# Patient Record
Sex: Male | Born: 1935
Health system: Southern US, Community
[De-identification: ages and names within clinical notes are randomized; demographics above are authoritative.]

## PROBLEM LIST (undated history)

## (undated) ENCOUNTER — Emergency Department (HOSPITAL_BASED_OUTPATIENT_CLINIC_OR_DEPARTMENT_OTHER): Admission: EM | Payer: Medicare Other | Source: Home / Self Care

## (undated) DIAGNOSIS — I509 Heart failure, unspecified: Secondary | ICD-10-CM

## (undated) DIAGNOSIS — E119 Type 2 diabetes mellitus without complications: Secondary | ICD-10-CM

## (undated) DIAGNOSIS — I10 Essential (primary) hypertension: Secondary | ICD-10-CM

---

## 1997-08-16 ENCOUNTER — Ambulatory Visit (HOSPITAL_COMMUNITY): Admission: RE | Admit: 1997-08-16 | Discharge: 1997-08-16 | Payer: Self-pay | Admitting: Internal Medicine

## 1997-09-12 ENCOUNTER — Ambulatory Visit (HOSPITAL_COMMUNITY): Admission: RE | Admit: 1997-09-12 | Discharge: 1997-09-12 | Payer: Self-pay | Admitting: *Deleted

## 1997-09-28 ENCOUNTER — Ambulatory Visit (HOSPITAL_COMMUNITY): Admission: RE | Admit: 1997-09-28 | Discharge: 1997-09-28 | Payer: Self-pay | Admitting: Cardiology

## 1997-10-02 ENCOUNTER — Ambulatory Visit (HOSPITAL_BASED_OUTPATIENT_CLINIC_OR_DEPARTMENT_OTHER): Admission: RE | Admit: 1997-10-02 | Discharge: 1997-10-02 | Payer: Self-pay | Admitting: Orthopedic Surgery

## 1999-09-01 ENCOUNTER — Encounter: Payer: Self-pay | Admitting: Endocrinology

## 1999-09-01 ENCOUNTER — Ambulatory Visit (HOSPITAL_COMMUNITY): Admission: RE | Admit: 1999-09-01 | Discharge: 1999-09-01 | Payer: Self-pay | Admitting: Endocrinology

## 2000-09-08 ENCOUNTER — Ambulatory Visit (HOSPITAL_COMMUNITY): Admission: RE | Admit: 2000-09-08 | Discharge: 2000-09-08 | Payer: Self-pay | Admitting: Internal Medicine

## 2000-09-08 ENCOUNTER — Encounter: Payer: Self-pay | Admitting: Internal Medicine

## 2001-05-17 ENCOUNTER — Encounter: Payer: Self-pay | Admitting: Rheumatology

## 2001-05-17 ENCOUNTER — Encounter: Admission: RE | Admit: 2001-05-17 | Discharge: 2001-05-17 | Payer: Self-pay | Admitting: Rheumatology

## 2002-10-15 ENCOUNTER — Ambulatory Visit (HOSPITAL_BASED_OUTPATIENT_CLINIC_OR_DEPARTMENT_OTHER): Admission: RE | Admit: 2002-10-15 | Discharge: 2002-10-15 | Payer: Self-pay | Admitting: Cardiology

## 2003-07-02 ENCOUNTER — Encounter: Admission: RE | Admit: 2003-07-02 | Discharge: 2003-07-02 | Payer: Self-pay | Admitting: Rheumatology

## 2003-07-13 ENCOUNTER — Ambulatory Visit (HOSPITAL_COMMUNITY): Admission: RE | Admit: 2003-07-13 | Discharge: 2003-07-13 | Payer: Self-pay | Admitting: Internal Medicine

## 2003-08-07 ENCOUNTER — Encounter: Admission: RE | Admit: 2003-08-07 | Discharge: 2003-08-07 | Payer: Self-pay | Admitting: Rheumatology

## 2003-12-28 ENCOUNTER — Ambulatory Visit: Payer: Self-pay | Admitting: Cardiology

## 2003-12-31 ENCOUNTER — Ambulatory Visit: Payer: Self-pay | Admitting: Internal Medicine

## 2004-01-07 ENCOUNTER — Ambulatory Visit: Payer: Self-pay | Admitting: Cardiology

## 2004-02-04 ENCOUNTER — Ambulatory Visit: Payer: Self-pay | Admitting: Internal Medicine

## 2004-02-13 ENCOUNTER — Encounter: Admission: RE | Admit: 2004-02-13 | Discharge: 2004-02-13 | Payer: Self-pay | Admitting: Neurosurgery

## 2005-01-19 ENCOUNTER — Ambulatory Visit: Payer: Self-pay | Admitting: Cardiology

## 2005-06-09 ENCOUNTER — Ambulatory Visit: Payer: Self-pay | Admitting: Internal Medicine

## 2005-07-10 ENCOUNTER — Ambulatory Visit: Payer: Self-pay | Admitting: Internal Medicine

## 2005-07-10 LAB — CONVERTED CEMR LAB: PSA: 0.69 ng/mL

## 2005-07-16 ENCOUNTER — Ambulatory Visit: Payer: Self-pay | Admitting: Internal Medicine

## 2005-08-14 ENCOUNTER — Ambulatory Visit: Payer: Self-pay | Admitting: Internal Medicine

## 2005-11-25 ENCOUNTER — Ambulatory Visit: Payer: Self-pay | Admitting: Internal Medicine

## 2006-09-09 ENCOUNTER — Ambulatory Visit: Payer: Self-pay | Admitting: Cardiology

## 2006-09-27 ENCOUNTER — Ambulatory Visit: Payer: Self-pay | Admitting: Internal Medicine

## 2006-09-27 LAB — CONVERTED CEMR LAB
ALT: 21 units/L (ref 0–53)
AST: 20 units/L (ref 0–37)
Albumin: 3.6 g/dL (ref 3.5–5.2)
Alkaline Phosphatase: 84 units/L (ref 39–117)
BUN: 18 mg/dL (ref 6–23)
Basophils Absolute: 0.1 10*3/uL (ref 0.0–0.1)
Basophils Relative: 2.2 % — ABNORMAL HIGH (ref 0.0–1.0)
Bilirubin Urine: NEGATIVE
Bilirubin, Direct: 0.1 mg/dL (ref 0.0–0.3)
CO2: 27 meq/L (ref 19–32)
Calcium: 9.1 mg/dL (ref 8.4–10.5)
Chloride: 109 meq/L (ref 96–112)
Cholesterol: 198 mg/dL (ref 0–200)
Creatinine, Ser: 1.3 mg/dL (ref 0.4–1.5)
Direct LDL: 122.8 mg/dL
Eosinophils Absolute: 0.5 10*3/uL (ref 0.0–0.6)
Eosinophils Relative: 6.9 % — ABNORMAL HIGH (ref 0.0–5.0)
GFR calc Af Amer: 70 mL/min
GFR calc non Af Amer: 58 mL/min
Glucose, Bld: 149 mg/dL — ABNORMAL HIGH (ref 70–99)
HCT: 39.2 % (ref 39.0–52.0)
HDL: 34.5 mg/dL — ABNORMAL LOW (ref >39.0)
Hemoglobin, Urine: NEGATIVE
Hemoglobin: 13.8 g/dL (ref 13.0–17.0)
Hgb A1c MFr Bld: 6.3 % — ABNORMAL HIGH (ref 4.6–6.0)
Ketones, ur: NEGATIVE mg/dL
Leukocytes, UA: NEGATIVE
Lymphocytes Relative: 18.4 % (ref 12.0–46.0)
MCHC: 35.1 g/dL (ref 30.0–36.0)
MCV: 95.3 fL (ref 78.0–100.0)
Monocytes Absolute: 0.7 10*3/uL (ref 0.2–0.7)
Monocytes Relative: 9.7 % (ref 3.0–11.0)
Neutro Abs: 4.2 10*3/uL (ref 1.4–7.7)
Neutrophils Relative %: 62.8 % (ref 43.0–77.0)
Nitrite: NEGATIVE
PSA: 0.66 ng/mL (ref 0.10–4.00)
Platelets: 291 10*3/uL (ref 150–400)
Potassium: 4.1 meq/L (ref 3.5–5.1)
RBC: 4.12 M/uL — ABNORMAL LOW (ref 4.22–5.81)
RDW: 14.9 % — ABNORMAL HIGH (ref 11.5–14.6)
Sodium: 143 meq/L (ref 135–145)
Specific Gravity, Urine: >=1.03 (ref 1.000–1.03)
TSH: 1.22 microintl units/mL (ref 0.35–5.50)
Total Bilirubin: 0.7 mg/dL (ref 0.3–1.2)
Total CHOL/HDL Ratio: 5.7
Total Protein, Urine: NEGATIVE mg/dL
Total Protein: 6.5 g/dL (ref 6.0–8.3)
Triglycerides: 231 mg/dL (ref 0–149)
Urine Glucose: NEGATIVE mg/dL
Urobilinogen, UA: 0.2 (ref 0.0–1.0)
VLDL: 46 mg/dL — ABNORMAL HIGH (ref 0–40)
WBC: 6.8 10*3/uL (ref 4.5–10.5)
pH: 5.5 (ref 5.0–8.0)

## 2006-09-28 ENCOUNTER — Encounter: Payer: Self-pay | Admitting: Internal Medicine

## 2006-09-28 DIAGNOSIS — Z8679 Personal history of other diseases of the circulatory system: Secondary | ICD-10-CM | POA: Insufficient documentation

## 2006-09-28 DIAGNOSIS — M545 Low back pain, unspecified: Secondary | ICD-10-CM | POA: Insufficient documentation

## 2006-09-28 DIAGNOSIS — R32 Unspecified urinary incontinence: Secondary | ICD-10-CM | POA: Insufficient documentation

## 2006-09-28 DIAGNOSIS — Z860100 Personal history of colon polyps, unspecified: Secondary | ICD-10-CM | POA: Insufficient documentation

## 2006-09-28 DIAGNOSIS — E119 Type 2 diabetes mellitus without complications: Secondary | ICD-10-CM | POA: Insufficient documentation

## 2006-09-28 DIAGNOSIS — F329 Major depressive disorder, single episode, unspecified: Secondary | ICD-10-CM

## 2006-09-28 DIAGNOSIS — M069 Rheumatoid arthritis, unspecified: Secondary | ICD-10-CM | POA: Insufficient documentation

## 2006-09-28 DIAGNOSIS — J309 Allergic rhinitis, unspecified: Secondary | ICD-10-CM | POA: Insufficient documentation

## 2006-09-28 DIAGNOSIS — K279 Peptic ulcer, site unspecified, unspecified as acute or chronic, without hemorrhage or perforation: Secondary | ICD-10-CM | POA: Insufficient documentation

## 2006-09-28 DIAGNOSIS — K648 Other hemorrhoids: Secondary | ICD-10-CM | POA: Insufficient documentation

## 2006-09-28 DIAGNOSIS — Z87442 Personal history of urinary calculi: Secondary | ICD-10-CM | POA: Insufficient documentation

## 2006-09-28 DIAGNOSIS — F3289 Other specified depressive episodes: Secondary | ICD-10-CM | POA: Insufficient documentation

## 2006-09-28 DIAGNOSIS — G4733 Obstructive sleep apnea (adult) (pediatric): Secondary | ICD-10-CM | POA: Insufficient documentation

## 2006-09-28 DIAGNOSIS — I509 Heart failure, unspecified: Secondary | ICD-10-CM | POA: Insufficient documentation

## 2006-10-26 ENCOUNTER — Ambulatory Visit: Payer: Self-pay | Admitting: Gastroenterology

## 2006-11-09 ENCOUNTER — Ambulatory Visit: Payer: Self-pay | Admitting: Gastroenterology

## 2007-02-28 ENCOUNTER — Ambulatory Visit: Payer: Self-pay | Admitting: Internal Medicine

## 2007-02-28 DIAGNOSIS — K219 Gastro-esophageal reflux disease without esophagitis: Secondary | ICD-10-CM | POA: Insufficient documentation

## 2007-02-28 DIAGNOSIS — L989 Disorder of the skin and subcutaneous tissue, unspecified: Secondary | ICD-10-CM | POA: Insufficient documentation

## 2007-02-28 DIAGNOSIS — E785 Hyperlipidemia, unspecified: Secondary | ICD-10-CM | POA: Insufficient documentation

## 2007-02-28 DIAGNOSIS — I10 Essential (primary) hypertension: Secondary | ICD-10-CM | POA: Insufficient documentation

## 2007-03-01 ENCOUNTER — Encounter (INDEPENDENT_AMBULATORY_CARE_PROVIDER_SITE_OTHER): Payer: Self-pay | Admitting: *Deleted

## 2007-03-03 ENCOUNTER — Encounter: Payer: Self-pay | Admitting: Internal Medicine

## 2007-03-03 LAB — CONVERTED CEMR LAB
BUN: 18 mg/dL (ref 6–23)
CO2: 26 meq/L (ref 19–32)
Calcium: 9.8 mg/dL (ref 8.4–10.5)
Chloride: 105 meq/L (ref 96–112)
Cholesterol: 190 mg/dL (ref 0–200)
Creatinine, Ser: 1.5 mg/dL (ref 0.4–1.5)
Direct LDL: 125.4 mg/dL
GFR calc Af Amer: 59 mL/min
GFR calc non Af Amer: 49 mL/min
Glucose, Bld: 138 mg/dL — ABNORMAL HIGH (ref 70–99)
HDL: 30.4 mg/dL — ABNORMAL LOW (ref >39.0)
Hgb A1c MFr Bld: 7 % — ABNORMAL HIGH (ref 4.6–6.0)
Potassium: 4.3 meq/L (ref 3.5–5.1)
Sodium: 139 meq/L (ref 135–145)
Total CHOL/HDL Ratio: 6.3
Triglycerides: 229 mg/dL (ref 0–149)
VLDL: 46 mg/dL — ABNORMAL HIGH (ref 0–40)

## 2007-03-07 ENCOUNTER — Telehealth: Payer: Self-pay | Admitting: Internal Medicine

## 2007-04-02 ENCOUNTER — Encounter: Payer: Self-pay | Admitting: Internal Medicine

## 2007-06-01 ENCOUNTER — Encounter: Payer: Self-pay | Admitting: Internal Medicine

## 2007-06-29 ENCOUNTER — Ambulatory Visit: Payer: Self-pay | Admitting: Internal Medicine

## 2007-06-29 LAB — CONVERTED CEMR LAB: Blood Glucose, Fingerstick: 129

## 2007-09-07 ENCOUNTER — Ambulatory Visit: Payer: Self-pay | Admitting: Internal Medicine

## 2007-09-07 ENCOUNTER — Ambulatory Visit: Payer: Self-pay | Admitting: Cardiology

## 2007-09-07 DIAGNOSIS — R1032 Left lower quadrant pain: Secondary | ICD-10-CM | POA: Insufficient documentation

## 2007-09-07 LAB — CONVERTED CEMR LAB
ALT: 24 units/L (ref 0–53)
AST: 24 units/L (ref 0–37)
Albumin: 3.6 g/dL (ref 3.5–5.2)
Alkaline Phosphatase: 69 units/L (ref 39–117)
BUN: 13 mg/dL (ref 6–23)
Bacteria, UA: NEGATIVE
Basophils Absolute: 0 10*3/uL (ref 0.0–0.1)
Basophils Relative: 0 % (ref 0.0–1.0)
Bilirubin Urine: NEGATIVE
Bilirubin, Direct: 0.2 mg/dL (ref 0.0–0.3)
CO2: 26 meq/L (ref 19–32)
Calcium: 9 mg/dL (ref 8.4–10.5)
Chloride: 109 meq/L (ref 96–112)
Creatinine, Ser: 1.3 mg/dL (ref 0.4–1.5)
Crystals: NEGATIVE
Eosinophils Absolute: 0.3 10*3/uL (ref 0.0–0.7)
Eosinophils Relative: 4.2 % (ref 0.0–5.0)
GFR calc Af Amer: 70 mL/min
GFR calc non Af Amer: 58 mL/min
Glucose, Bld: 120 mg/dL — ABNORMAL HIGH (ref 70–99)
HCT: 40.1 % (ref 39.0–52.0)
Hemoglobin, Urine: NEGATIVE
Hemoglobin: 13.8 g/dL (ref 13.0–17.0)
Hgb A1c MFr Bld: 6.3 % — ABNORMAL HIGH (ref 4.6–6.0)
Ketones, ur: NEGATIVE mg/dL
Leukocytes, UA: NEGATIVE
Lipase: 25 units/L (ref 11.0–59.0)
Lymphocytes Relative: 14.9 % (ref 12.0–46.0)
MCHC: 34.4 g/dL (ref 30.0–36.0)
MCV: 98.4 fL (ref 78.0–100.0)
Monocytes Absolute: 0.4 10*3/uL (ref 0.1–1.0)
Monocytes Relative: 6.2 % (ref 3.0–12.0)
Neutro Abs: 5.3 10*3/uL (ref 1.4–7.7)
Neutrophils Relative %: 74.7 % (ref 43.0–77.0)
Nitrite: NEGATIVE
Platelets: 294 10*3/uL (ref 150–400)
Potassium: 4.5 meq/L (ref 3.5–5.1)
RBC: 4.08 M/uL — ABNORMAL LOW (ref 4.22–5.81)
RDW: 13.4 % (ref 11.5–14.6)
Sodium: 140 meq/L (ref 135–145)
Specific Gravity, Urine: >=1.03 (ref 1.000–1.03)
Total Bilirubin: 0.8 mg/dL (ref 0.3–1.2)
Total Protein, Urine: NEGATIVE mg/dL
Total Protein: 6.5 g/dL (ref 6.0–8.3)
Urine Glucose: NEGATIVE mg/dL
Urobilinogen, UA: 0.2 (ref 0.0–1.0)
WBC: 7.1 10*3/uL (ref 4.5–10.5)
pH: 5.5 (ref 5.0–8.0)

## 2007-09-08 ENCOUNTER — Encounter: Payer: Self-pay | Admitting: Internal Medicine

## 2007-09-26 ENCOUNTER — Ambulatory Visit: Payer: Self-pay | Admitting: Cardiology

## 2007-11-02 ENCOUNTER — Encounter: Payer: Self-pay | Admitting: Internal Medicine

## 2008-01-02 ENCOUNTER — Encounter: Payer: Self-pay | Admitting: Internal Medicine

## 2008-01-13 ENCOUNTER — Encounter: Payer: Self-pay | Admitting: Internal Medicine

## 2008-04-03 ENCOUNTER — Encounter: Payer: Self-pay | Admitting: Internal Medicine

## 2008-04-30 ENCOUNTER — Ambulatory Visit: Payer: Self-pay | Admitting: Internal Medicine

## 2008-04-30 DIAGNOSIS — IMO0002 Reserved for concepts with insufficient information to code with codable children: Secondary | ICD-10-CM | POA: Insufficient documentation

## 2008-04-30 LAB — CONVERTED CEMR LAB
ALT: 33 units/L (ref 0–53)
AST: 28 units/L (ref 0–37)
Albumin: 4 g/dL (ref 3.5–5.2)
Alkaline Phosphatase: 76 units/L (ref 39–117)
BUN: 17 mg/dL (ref 6–23)
Basophils Absolute: 0 10*3/uL (ref 0.0–0.1)
Basophils Relative: 0.1 % (ref 0.0–3.0)
Bilirubin Urine: NEGATIVE
Bilirubin, Direct: 0.1 mg/dL (ref 0.0–0.3)
CO2: 27 meq/L (ref 19–32)
Calcium: 9 mg/dL (ref 8.4–10.5)
Chloride: 109 meq/L (ref 96–112)
Cholesterol: 145 mg/dL (ref 0–200)
Creatinine, Ser: 1.4 mg/dL (ref 0.4–1.5)
Creatinine,U: 427.9 mg/dL
Eosinophils Absolute: 0.4 10*3/uL (ref 0.0–0.7)
Eosinophils Relative: 7.1 % — ABNORMAL HIGH (ref 0.0–5.0)
GFR calc Af Amer: 64 mL/min
GFR calc non Af Amer: 53 mL/min
Glucose, Bld: 130 mg/dL — ABNORMAL HIGH (ref 70–99)
HCT: 42.3 % (ref 39.0–52.0)
HDL: 37.3 mg/dL — ABNORMAL LOW (ref >39.0)
Hemoglobin, Urine: NEGATIVE
Hemoglobin: 14.6 g/dL (ref 13.0–17.0)
Hgb A1c MFr Bld: 7.3 % — ABNORMAL HIGH (ref 4.6–6.0)
Ketones, ur: NEGATIVE mg/dL
LDL Cholesterol: 76 mg/dL (ref 0–99)
Leukocytes, UA: NEGATIVE
Lymphocytes Relative: 19.7 % (ref 12.0–46.0)
MCHC: 34.6 g/dL (ref 30.0–36.0)
MCV: 98.5 fL (ref 78.0–100.0)
Microalb Creat Ratio: 3 mg/g (ref 0.0–30.0)
Microalb, Ur: 1.3 mg/dL (ref 0.0–1.9)
Monocytes Absolute: 0.5 10*3/uL (ref 0.1–1.0)
Monocytes Relative: 9.6 % (ref 3.0–12.0)
Neutro Abs: 3.7 10*3/uL (ref 1.4–7.7)
Neutrophils Relative %: 63.5 % (ref 43.0–77.0)
Nitrite: NEGATIVE
PSA: 0.6 ng/mL (ref 0.10–4.00)
Platelets: 198 10*3/uL (ref 150–400)
Potassium: 4.2 meq/L (ref 3.5–5.1)
RBC: 4.29 M/uL (ref 4.22–5.81)
RDW: 12.7 % (ref 11.5–14.6)
Sodium: 143 meq/L (ref 135–145)
Specific Gravity, Urine: >=1.03 (ref 1.000–1.035)
TSH: 2.24 microintl units/mL (ref 0.35–5.50)
Total Bilirubin: 0.9 mg/dL (ref 0.3–1.2)
Total CHOL/HDL Ratio: 3.9
Total Protein: 6.7 g/dL (ref 6.0–8.3)
Triglycerides: 161 mg/dL — ABNORMAL HIGH (ref 0–149)
Urine Glucose: NEGATIVE mg/dL
Urobilinogen, UA: 0.2 (ref 0.0–1.0)
VLDL: 32 mg/dL (ref 0–40)
WBC: 5.7 10*3/uL (ref 4.5–10.5)
pH: 5 (ref 5.0–8.0)

## 2008-05-01 LAB — CONVERTED CEMR LAB: Vit D, 25-Hydroxy: 32 ng/mL (ref 30–89)

## 2008-05-18 ENCOUNTER — Telehealth (INDEPENDENT_AMBULATORY_CARE_PROVIDER_SITE_OTHER): Payer: Self-pay | Admitting: *Deleted

## 2008-05-28 ENCOUNTER — Encounter: Admission: RE | Admit: 2008-05-28 | Discharge: 2008-05-28 | Payer: Self-pay | Admitting: Internal Medicine

## 2008-06-26 ENCOUNTER — Telehealth: Payer: Self-pay | Admitting: Cardiology

## 2008-07-16 ENCOUNTER — Ambulatory Visit: Payer: Self-pay | Admitting: Internal Medicine

## 2008-07-16 DIAGNOSIS — B029 Zoster without complications: Secondary | ICD-10-CM | POA: Insufficient documentation

## 2008-07-21 ENCOUNTER — Telehealth: Payer: Self-pay | Admitting: Internal Medicine

## 2008-07-24 ENCOUNTER — Telehealth (INDEPENDENT_AMBULATORY_CARE_PROVIDER_SITE_OTHER): Payer: Self-pay | Admitting: *Deleted

## 2008-08-06 ENCOUNTER — Telehealth: Payer: Self-pay | Admitting: Internal Medicine

## 2008-08-13 ENCOUNTER — Telehealth: Payer: Self-pay | Admitting: Internal Medicine

## 2008-08-14 ENCOUNTER — Encounter: Payer: Self-pay | Admitting: Internal Medicine

## 2008-08-28 ENCOUNTER — Ambulatory Visit: Payer: Self-pay | Admitting: Cardiovascular Disease

## 2008-08-28 ENCOUNTER — Telehealth: Payer: Self-pay | Admitting: Cardiology

## 2008-08-28 ENCOUNTER — Observation Stay (HOSPITAL_COMMUNITY): Admission: EM | Admit: 2008-08-28 | Discharge: 2008-08-29 | Payer: Self-pay | Admitting: Emergency Medicine

## 2008-08-29 ENCOUNTER — Telehealth (INDEPENDENT_AMBULATORY_CARE_PROVIDER_SITE_OTHER): Payer: Self-pay | Admitting: *Deleted

## 2008-08-29 ENCOUNTER — Telehealth: Payer: Self-pay | Admitting: Cardiology

## 2008-09-03 ENCOUNTER — Telehealth (INDEPENDENT_AMBULATORY_CARE_PROVIDER_SITE_OTHER): Payer: Self-pay | Admitting: *Deleted

## 2008-09-13 ENCOUNTER — Encounter (INDEPENDENT_AMBULATORY_CARE_PROVIDER_SITE_OTHER): Payer: Self-pay | Admitting: *Deleted

## 2008-09-24 ENCOUNTER — Telehealth (INDEPENDENT_AMBULATORY_CARE_PROVIDER_SITE_OTHER): Payer: Self-pay | Admitting: *Deleted

## 2008-11-20 ENCOUNTER — Ambulatory Visit: Payer: Self-pay | Admitting: Cardiology

## 2008-12-11 ENCOUNTER — Inpatient Hospital Stay (HOSPITAL_COMMUNITY): Admission: EM | Admit: 2008-12-11 | Discharge: 2008-12-14 | Payer: Self-pay | Admitting: Emergency Medicine

## 2008-12-11 ENCOUNTER — Ambulatory Visit: Payer: Self-pay | Admitting: Cardiovascular Disease

## 2008-12-11 ENCOUNTER — Ambulatory Visit: Payer: Self-pay | Admitting: Surgery

## 2008-12-12 ENCOUNTER — Encounter (INDEPENDENT_AMBULATORY_CARE_PROVIDER_SITE_OTHER): Payer: Self-pay | Admitting: Internal Medicine

## 2009-06-13 ENCOUNTER — Emergency Department (HOSPITAL_COMMUNITY): Admission: EM | Admit: 2009-06-13 | Discharge: 2009-06-13 | Payer: Self-pay | Admitting: Emergency Medicine

## 2009-11-18 ENCOUNTER — Emergency Department (HOSPITAL_COMMUNITY): Admission: EM | Admit: 2009-11-18 | Discharge: 2009-11-18 | Payer: Self-pay | Admitting: Emergency Medicine

## 2010-05-06 NOTE — Letter (Signed)
Summary: Dismissal Activation Form, Return Receipt  Dismissal Activation Form, Return Receipt   Imported By: Maryln Gottron 05/02/2010 10:03:08  _____________________________________________________________________  External Attachment:    Type:   Image     Comment:   External Document

## 2010-05-08 LAB — POCT I-STAT, CHEM 8
BUN: 21 mg/dL (ref 6–23)
Calcium, Ion: 1.22 mmol/L (ref 1.12–1.32)
Chloride: 107 mEq/L (ref 96–112)
Creatinine, Ser: 1.3 mg/dL (ref 0.4–1.5)
Glucose, Bld: 58 mg/dL — ABNORMAL LOW (ref 70–99)
HCT: 40 % (ref 39.0–52.0)
Hemoglobin: 13.6 g/dL (ref 13.0–17.0)
Potassium: 4 mEq/L (ref 3.5–5.1)
Sodium: 138 mEq/L (ref 135–145)
TCO2: 23 mmol/L (ref 0–100)

## 2010-05-08 LAB — D-DIMER, QUANTITATIVE: D-Dimer, Quant: 1.11 ug/mL-FEU — ABNORMAL HIGH (ref 0.00–0.48)

## 2010-05-08 LAB — POCT CARDIAC MARKERS
CKMB, poc: 2.4 ng/mL (ref 1.0–8.0)
Troponin i, poc: <0.05 ng/mL (ref 0.00–0.09)

## 2010-05-29 LAB — UIFE/LIGHT CHAINS/TP QN, 24-HR UR
Free Lambda Lt Chains,Ur: 0.54 mg/dL (ref 0.08–1.01)
Total Protein, Urine: 4.4 mg/dL

## 2010-05-29 LAB — DIFFERENTIAL
Basophils Absolute: 0 10*3/uL (ref 0.0–0.1)
Basophils Relative: 0 % (ref 0–1)
Eosinophils Absolute: 0 10*3/uL (ref 0.0–0.7)
Eosinophils Absolute: 0.1 10*3/uL (ref 0.0–0.7)
Eosinophils Relative: 0 % (ref 0–5)
Lymphocytes Relative: 22 % (ref 12–46)
Lymphs Abs: 0.7 10*3/uL (ref 0.7–4.0)
Monocytes Absolute: 0.4 10*3/uL (ref 0.1–1.0)
Monocytes Relative: 7 % (ref 3–12)
Neutro Abs: 2.2 10*3/uL (ref 1.7–7.7)
Neutrophils Relative %: 69 % (ref 43–77)

## 2010-05-29 LAB — CBC
HCT: 29.1 % — ABNORMAL LOW (ref 39.0–52.0)
Hemoglobin: 8.5 g/dL — ABNORMAL LOW (ref 13.0–17.0)
Hemoglobin: 9.9 g/dL — ABNORMAL LOW (ref 13.0–17.0)
MCHC: 34.2 g/dL (ref 30.0–36.0)
MCHC: 35 g/dL (ref 30.0–36.0)
MCV: 103.8 fL — ABNORMAL HIGH (ref 78.0–100.0)
MCV: 104.8 fL — ABNORMAL HIGH (ref 78.0–100.0)
RDW: 13.5 % (ref 11.5–15.5)
RDW: 14.4 % (ref 11.5–15.5)
RDW: 14.6 % (ref 11.5–15.5)
WBC: 3.6 10*3/uL — ABNORMAL LOW (ref 4.0–10.5)

## 2010-05-29 LAB — CULTURE, BLOOD (ROUTINE X 2)

## 2010-05-29 LAB — URINALYSIS, ROUTINE W REFLEX MICROSCOPIC
Bilirubin Urine: NEGATIVE
Nitrite: NEGATIVE
Specific Gravity, Urine: 1.011 (ref 1.005–1.030)
Urobilinogen, UA: 0.2 mg/dL (ref 0.0–1.0)

## 2010-05-29 LAB — HEMOCCULT GUIAC POC 1CARD (OFFICE): Fecal Occult Bld: NEGATIVE

## 2010-05-29 LAB — COMPREHENSIVE METABOLIC PANEL
ALT: 49 U/L (ref 0–53)
AST: 35 U/L (ref 0–37)
Albumin: 2.8 g/dL — ABNORMAL LOW (ref 3.5–5.2)
Alkaline Phosphatase: 69 U/L (ref 39–117)
Calcium: 7.6 mg/dL — ABNORMAL LOW (ref 8.4–10.5)
Chloride: 105 mEq/L (ref 96–112)
GFR calc Af Amer: 46 mL/min — ABNORMAL LOW (ref >60)
Glucose, Bld: 98 mg/dL (ref 70–99)
Potassium: 4.1 mEq/L (ref 3.5–5.1)
Sodium: 135 mEq/L (ref 135–145)
Total Bilirubin: 0.6 mg/dL (ref 0.3–1.2)
Total Protein: 5 g/dL — ABNORMAL LOW (ref 6.0–8.3)

## 2010-05-29 LAB — GLUCOSE, CAPILLARY
Glucose-Capillary: 100 mg/dL — ABNORMAL HIGH (ref 70–99)
Glucose-Capillary: 113 mg/dL — ABNORMAL HIGH (ref 70–99)
Glucose-Capillary: 187 mg/dL — ABNORMAL HIGH (ref 70–99)
Glucose-Capillary: 77 mg/dL (ref 70–99)
Glucose-Capillary: 96 mg/dL (ref 70–99)

## 2010-05-29 LAB — PROTEIN ELECTROPHORESIS, SERUM
Gamma Globulin: 17.2 % (ref 11.1–18.8)
Total Protein ELP: 6 g/dL (ref 6.0–8.3)

## 2010-05-29 LAB — CORTISOL: Cortisol, Plasma: 8.6 ug/dL

## 2010-05-29 LAB — IRON AND TIBC
Iron: 88 ug/dL (ref 42–135)
TIBC: 251 ug/dL (ref 215–435)

## 2010-05-29 LAB — URINE CULTURE

## 2010-05-29 LAB — LACTATE DEHYDROGENASE: LDH: 186 U/L (ref 94–250)

## 2010-05-29 LAB — CK TOTAL AND CKMB (NOT AT ARMC): CK, MB: 0.6 ng/mL (ref 0.3–4.0)

## 2010-06-01 LAB — BASIC METABOLIC PANEL
BUN: 23 mg/dL (ref 6–23)
CO2: 27 mEq/L (ref 19–32)
Chloride: 104 mEq/L (ref 96–112)
GFR calc Af Amer: 55 mL/min — ABNORMAL LOW (ref >60)
GFR calc Af Amer: >60 mL/min (ref >60)
GFR calc non Af Amer: 50 mL/min — ABNORMAL LOW (ref >60)
Potassium: 5.1 mEq/L (ref 3.5–5.1)
Sodium: 134 mEq/L — ABNORMAL LOW (ref 135–145)
Sodium: 138 mEq/L (ref 135–145)

## 2010-06-01 LAB — DIFFERENTIAL
Eosinophils Relative: 3 % (ref 0–5)
Lymphocytes Relative: 10 % — ABNORMAL LOW (ref 12–46)
Lymphs Abs: 0.7 10*3/uL (ref 0.7–4.0)
Monocytes Relative: 7 % (ref 3–12)

## 2010-06-01 LAB — GLUCOSE, CAPILLARY
Glucose-Capillary: 111 mg/dL — ABNORMAL HIGH (ref 70–99)
Glucose-Capillary: 151 mg/dL — ABNORMAL HIGH (ref 70–99)

## 2010-06-01 LAB — CBC
HCT: 42.6 % (ref 39.0–52.0)
Platelets: 240 10*3/uL (ref 150–400)
RBC: 4.23 MIL/uL (ref 4.22–5.81)
WBC: 7.7 10*3/uL (ref 4.0–10.5)

## 2010-06-01 LAB — PROTIME-INR: Prothrombin Time: 13.5 seconds (ref 11.6–15.2)

## 2010-06-01 LAB — CARDIAC PANEL(CRET KIN+CKTOT+MB+TROPI)
Relative Index: INVALID (ref 0.0–2.5)
Total CK: 49 U/L (ref 7–232)

## 2010-06-01 LAB — D-DIMER, QUANTITATIVE: D-Dimer, Quant: 1.09 ug/mL-FEU — ABNORMAL HIGH (ref 0.00–0.48)

## 2010-06-01 LAB — CK TOTAL AND CKMB (NOT AT ARMC)
CK, MB: 1 ng/mL (ref 0.3–4.0)
Relative Index: INVALID (ref 0.0–2.5)

## 2010-07-08 NOTE — H&P (Signed)
NAME:  Howard Shepard, Howard Shepard NO.:  [id number]   MEDICAL RECORD NO.:  [id number]          PATIENT TYPE:  INP   LOCATION:  2008                         FACILITY:  [facility]   PHYSICIAN:  Veverly Fells. Excell Seltzer, MD  DATE OF BIRTH:  December 07, 1933   DATE OF ADMISSION:  08/28/2008  DATE OF DISCHARGE:                              HISTORY & PHYSICAL   PRIMARY CARDIOLOGIST:  [name], MD, [facility]   PRIMARY CARE PHYSICIAN:  Corwin Levins, MD   CHIEF COMPLAINT:  Shortness of breath.   HISTORY OF PRESENT ILLNESS:  Howard Shepard is a 77 year old Caucasian male  with a history significant for nonischemic cardiomyopathy, last ejection  fraction measured at 50%, well-controlled hypertension, and  hyperlipidemia followed by Dr. Jonny Ruiz presenting with acute onset of  shortness of breath in the setting of 1-1/2 months of severe pain  secondary to shingles.  The patient reports significant pain in the  right upper back and right shoulder over the last 1 to 1-1/2 months, and  reports being diagnosed with shingles at his last appointment with Dr.  Jonny Ruiz.  Since that time, the patient has been very inactive secondary to  his discomfort.  The patient also reports 1 month of productive cough  with mild amount of yellowish sputum production.  No recent change to  his cough.  Earlier today, he began to experience significant shortness  of breath.  He reports being able to take a deep breath, but still felt  like he was not getting enough air.  He denies chest pain, orthopnea,  lower extremity edema, nausea/vomiting, fever/chills, or any other  complaints.  Currently, shortness of breath slightly improved.  In the  emergency room, his BP was mildly elevated without tachycardia or  tachypnea.   PAST MEDICAL HISTORY:  1. Nonischemic cardiomyopathy, EF equal to 50%.  2. Hypertension (well controlled).  3. Hyperlipidemia (followed by Dr. Jonny Ruiz).  4. Shingles.  5. Lumbar radiculopathy, left.  6. History of  peptic ulcer disease.  7. History of nephrolithiasis.  8. GERD.  9. History of depression.  10.History of transient ischemic attack.  11.History of low back pain.  12.History of obstructive sleep apnea.  13.Type 2 diabetes mellitus.  14.History of internal hemorrhoids.  15.Allergic rhinitis.  16.Urinary incontinence.  17.History of polyps.  18.Rheumatoid arthritis.   SOCIAL HISTORY:  The patient lives in [place] with his wife.  He is  retired.  He has no smoking, EtOH, or illicit drug use history.  He  takes folic acid, vitamin D, and a multivitamin.  He has a low sugar,  mostly low-sodium diet.  He does not regularly exercise especially  recently secondary to his shingles pain.   FAMILY HISTORY:  Noncontributory secondary to the patient's advanced  age.   REVIEW OF SYSTEMS:  Please see HPI.  Otherwise, mild constipation since  beginning the pain medications for shingles.  Denies any melena or other  bleeding.  All other systems reviewed and were negative.   ALLERGIES:  PENICILLIN.   MEDICATIONS:  1. Bisoprolol 5 mg p.o. daily.  2. Lisinopril 20 mg p.o. daily.  3. Enteric-coated aspirin 81 mg p.o. daily.  4. Methotrexate 2.5 mg weekly.  5. Folic acid 400 mcg p.o. daily.  6. Glimepiride 0.5 mg p.o. daily.  7. Nexium 40 mg every other day.  8. Metformin.  9. Citalopram 5 mg p.o. daily.  10.Percocet 5/325 mg p.o. q.6 h. p.r.n.  11.Neurontin 300 mg p.o. t.i.d.  12.Crestor 10 mg p.o. at bedtime.   PHYSICAL EXAMINATION:  VITAL SIGNS:  T-max 98.3; current temperature  97.3; BP on admission 132/99, currently 118/81; pulse on admission 92,  currently 76; respiratory rate on admission 22, currently 18; and O2  saturation 96% on room air.  GENERAL:  The patient is alert and oriented x3.  He is in no apparent  distress.  He is able to speak in full sentences without respiratory  distress.  HEENT:  His head is normocephalic and atraumatic.  Pupils equal round  and reactive  to light.  Extraocular muscles are intact.  Nares are  patent without discharge.  Dentition is fair.  Oropharynx without  erythema or exudate.  NECK:  Supple without lymphadenopathy.  No JVD.  No thyromegaly.  HEART:  Heart rate regular with audible S1 and S2.  No clicks, rubs,  murmurs, or gallops.  Pulses 2+ and equal in both upper and lower  extremities bilaterally.  LUNGS:  Clear to auscultation bilaterally with decreased breath sounds  at the right base.  SKIN:  Multiple, discrete 1-2 cm in diameter lesions in the right upper  back, face surrounded with mild erythema.  Otherwise, no rashes,  lesions, or petechiae.  ABDOMEN:  Soft, nontender, and nondistended.  Normal abdominal sounds.  No rebound or guarding.  No hepatosplenomegaly.  The patient is obese.  EXTREMITIES:  No clubbing, cyanosis, or edema.  MUSCULOSKELETAL:  No joint deformity or effusions.  No spinal or CVA  tenderness.  NEUROLOGIC:  Cranial nerves II through XII are grossly intact.  Strength  was 5/5 in all extremities and axial groups.  Normal sensation  throughout and normal cerebellar function.   RADIOLOGY:  1. Chest x-ray shows probable mild-to-moderate elevation of right      hemidiaphragm.  Subpulmonic right pleural effusion not excluded,      given that this appearance is the appearance of the right      hemidiaphragm changed compared to past chest x-ray (2005).      Followup PA and lateral chest radiograph may be useful.  Lungs are      clear.  2. CT angio negative for pulmonary emboli.  Atelectasis and/or      atelectatic pneumonia in the right lower lobe posteriorly.  Chronic-      appearing interstitial lung markings.  A degree of interstitial      edema is not excluded.  3. EKG, normal sinus rhythm at rate of 79.  No acute ST or T-wave      changes, no significant Q wave, normal axis, no evidence of      hypertrophy, PR 160, QRS 94, QTc 445.  No significant changes from      prior tracing completed  on November 2008.   LABORATORY DATA:  WBC 7.7, HGB 14.7, HCT 42.6, and PLT count 240.  Sodium 134, potassium 5.1, chloride 102, CO2 24, BUN 23, creatinine  1.40, and glucose 178.  D-dimer elevated at 1.09.  Cardiac enzymes  pending.  BNP pending.   ASSESSMENT AND PLAN:  Howard Shepard is a 77 year old Caucasian male with  history described above who presents  with acute onset of shortness of  breath in the setting of severe pain secondary to herpes zoster located  in the right upper back.  Pulmonary embolism has been ruled out and  cardiac etiology of the patient's symptoms are high unlikely; however,  the patient's family is extremely anxious regarding Howard Shepard  condition.  Therefore, we will admit into observation and complete 3  sets of cardiac enzymes as well as PA and lateral chest x-ray.  Family  reports very strange relationship with the primary care physician and  wishes for a referral to a new primary care doctor.  Also, the patient  has been off his Valtrex due to family report of inability to set up an  appointment with Dr. Jonny Ruiz; therefore, the patient will be started again  on his Valtrex and continue his other home medications except for  metformin in the setting of his mildly elevated creatinine and CT angio  today.  We will follow up with BMP in case symptomatology related to  heart failure.  Likely, the patient will be discharged tomorrow.      Jarrett Ables, [facility]      Veverly Fells. Excell Seltzer, MD  Electronically Signed    MS/MEDQ  D:  08/28/2008  T:  08/29/2008  Job:  [id number]

## 2010-07-08 NOTE — Assessment & Plan Note (Signed)
[facility]                            CARDIOLOGY OFFICE NOTE   Howard Shepard, Howard Shepard                     MRN:          [id number]  DATE:09/09/2006                            DOB:          [birth year]    PRIMARY CARE PHYSICIAN:  Dr. Oliver Barre.   REASON FOR VISIT:  Evaluate patient with nonischemic cardiomyopathy.   HISTORY OF PRESENT ILLNESS:  The patient presents for followup of his  nonischemic cardiomyopathy. He has been seeing Dr. Samule Ohm. He has been  doing well since I last saw him. He is now 77 years old. He did get low  blood pressures with systolics in the 90s. About a year ago he stopped  taking his medicine every other day and says he feels much better. He  says his blood pressure is up. He is not having any light-headedness. He  denies any presyncope or syncope. He is not having any new shortness of  breath, PND or orthopnea. He has had no chest pain. He was told he did  not have sleep apnea and he is not using CPAP.   PAST MEDICAL HISTORY:  Cardiomyopathy of unclear etiology (EF  approximately 35% in the past but improved to 50% in 2005 on an  echocardiogram), rheumatoid arthritis, peptic ulcer disease,  hypertension.   ALLERGIES:  PENICILLIN.   MEDICATIONS:  1. Bisoprolol 10 mg every other day.  2. Lisinopril 40 mg every other day.  3. Aspirin 81 mg daily.  4. Methotrexate 25 mg every week.  5. Folic acid 400 mg daily.  6. Omeprazole 20 mg daily.  7. Vitamin C.  8. Zyrtec.   REVIEW OF SYSTEMS:  As stated in the HPI and otherwise negative for  other systems.   PHYSICAL EXAMINATION:  The patient is in no distress.  Blood pressure 122/78, heart rate 73 and regular, weight 183 pounds.  HEENT:  Eyelids unremarkable. Pupils equal round and reactive to light.  Fundi not visualized. Oral mucosa unremarkable.  NECK:  No jugular venous distention at 45 degrees, carotid upstroke  brisk and symmetric, no bruits, no thyromegaly.  LUNGS:  Clear to auscultation bilaterally.  CHEST:  Unremarkable.  HEART:  PMI not displaced or sustained, S1 and S2 within normal limits,  no S3, no S4, no clicks, no rubs, no murmurs.  ABDOMEN:  Abdomen obese, positive bowel sounds, normal to frequency and  pitch, no bruits, no rebound, no guarding, no midline pulsatile mass, no  hepatomegaly, no splenomegaly.  SKIN:  No rashes, no nodules.  EXTREMITIES:  2+ pulses, no edema.   EKG:  Sinus rhythm, rate 73, axis within normal limits, interval is  within normal limits, no acute ST-T wave changes.   ASSESSMENT/PLAN:  1. Cardiomyopathy. I think I have convinced the patient to at least      take his lisinopril every other day and alternate that with his      bisoprolol. At least he will not spend entire days without one of      these drugs in his system. He understands and not buying the logic  of this regimen and would rather him reduce his medications in half      and take them everyday. However, he does not want to do this as he      feels good. He is not having any new symptoms. No further      cardiovascular testing is suggested.  2. Followup. I will see him back in one year or sooner if needed.     [name], MD, [facility]  Electronically Signed    JH/MedQ  DD: 09/09/2006  DT: 09/10/2006  Job #: [id number]   cc:   Corwin Levins, MD

## 2010-07-08 NOTE — Discharge Summary (Signed)
NAME:  Howard Shepard, Howard Shepard NO.:  [id number]   MEDICAL RECORD NO.:  [id number]          PATIENT TYPE:  INP   LOCATION:  2008                         FACILITY:  [facility]   PHYSICIAN:  [name], MD, FACCDATE OF BIRTH:  [birth year]   DATE OF ADMISSION:  08/28/2008  DATE OF DISCHARGE:  08/29/2008                               DISCHARGE SUMMARY   PRIMARY CARDIOLOGIST:  [name], MD, [facility]   PRIMARY CARE PHYSICIAN:  Corwin Levins, MD   DISCHARGE DIAGNOSIS:  Shortness of breath of unclear etiology  (noncardiac).   SECONDARY DIAGNOSES:  1. Nonischemic cardiomyopathy, ejection fraction equal to 50%.  2. Hypertension (well controlled).  3. Hyperlipidemia (followed by Dr. Jonny Ruiz).  4. Shingles.  5. Lumbar radiculopathy, left.  6. History of peptic ulcer disease.  7. History of nephrolithiasis.  8. Gastroesophageal reflux disease.  9. History of depression.  10.History of transient ischemic attack.  11.History of low back pain.  12.Obstructive sleep apnea.  13.Type 2 diabetes mellitus.  14.History of internal hemorrhoids.  15.Allergic rhinitis.  16.Urinary incontinence.  17.History of polyps.  18.Rheumatoid arthritis.   ALLERGIES:  PENICILLIN.   PROCEDURE PERFORMED DURING THIS HOSPITALIZATION:  1. EKG completed on August 28, 2008, showing normal sinus rhythm with no      acute ST-T wave changes, no significant Q-waves, normal axis, no      evidence of hypertrophy, intervals within normal limits.  No      significant change from his prior tracing completed on January 02, 2007.  2. Chest x-ray completed on August 28, 2008, showing probable mild-to-      moderate elevation of right hemidiaphragm, subpulmonic right      pleural effusion not excluded, change compared to prior chest      radiographs in 2005 (Followup PA and lateral may be useful).  The      lungs are clear.  3. CT angiography completed on August 28, 2008 of the chest with contrast      was negative  for pulmonary emboli.  Atelectasis and/or atelectatic      pneumonia in the right lower lobe posteriorly.  Chronic-appearing      interstitial lung markings.  A degree of interstitial edema not      excluded.  4. Chest x-ray (PA and lateral) completed on August 28, 2008, showed mild      atelectasis, right lower lobe.  Chronic interstitial lung markings.   HISTORY OF PRESENT ILLNESS:  Howard Shepard is a 77 year old Caucasian male  with history significant for nonischemic cardiomyopathy (EF equal to  50%), hypertension (well controlled) and hyperlipidemia (followed by Dr.  Jonny Ruiz), presenting with acute onset of shortness of breath in the setting  of 1-1/2 months of severe pain secondary to shingles.  The patient  reports significant pain in the right upper back and right shoulder over  the last 1-1/2 months and reports being diagnosis of shingles at his  last appointment with Dr. Jonny Ruiz.  Since that time, the patient has been  very inactive secondary to his discomfort.  The patient also reports 1-  month proximally a productive cough with mild amount of yellow sputum  production.  No recent change to his cough.  Early on August 28, 2008, he  began to experience significant shortness of breath.  The patient  reports being able to take a deep breath, but still feel like he was not  getting enough air.  Denies chest pain, orthopnea, lower extremity  edema, nausea/vomiting, fever/chills, or any other complaints.  Upon  evaluation in the ED, shortness of breath slightly improved.  In the ER,  his BP was mildly elevated without tachycardia or tachypnea.   HOSPITAL COURSE:  The patient admitted and underwent procedures as  described above.  He tolerated them well.  His imaging was negative for  PE or acute lung disease and his cardiac enzymes were negative as well  as having no elevation in his BNP, the patient was deemed stable for  discharge on morning of August 29, 2008.  The patient's symptoms   significantly improved on the morning of discharge.  The patient was  ambulated in the halls on room air and O2 saturation greater than and  equal to 90% at all times.  The patient was noted to have mildly  elevated creatinine of 1.4 on admission with slight increase on the  morning of discharge to 1.51.  The patient has been scheduled for repeat  basic metabolic panel in 1 week at [facility] to recheck this  level.  The patient has also been scheduled to see pulmonologist, Dr.  Sherene Sires, tomorrow at 3:15 p.m.  The patient has been given information on  how to contact [place] primary care for either an appointment with Dr.  Jonny Ruiz or another primary care Howard Shepard as they have indicated they would  like to see another physician.  Long discussions with family and the  patient prior to discharge and all questions and concerns were  addressed.  No medication changes other than a prescription for Valtrex,  which the patient has run out of.   DISCHARGE LABS:  Blood glucose ranged from 111-178.  Sodium 138,  potassium 4.8, chloride 104, CO2 27, BUN 21, creatinine 1.51 up from  1.40 on admission, calcium 9.2.  Cardiac enzymes negative x2.  BNP less  than 30.  Protime 13.5, INR 1.0, D-dimer 1.09.   FOLLOWUP PLANS AND APPOINTMENTS:  1. Dr. Sherene Sires on August 30, 2008 at 3:15 p.m.  2. [facility] in (lab draw) on September 06, 2008 at 10 a.m.  3. The patient instructed to schedule an appointment with primary care      Howard Shepard in the next 1-2 weeks.   DISCHARGE MEDICATIONS:  1. Bisoprolol 5 mg p.o. daily.  2. Lisinopril 20 mg p.o. daily.  3. Aspirin 81 mg p.o. daily.  4. Methotrexate 2.5 mg p.o. weekly.  5. Folic acid 400 mcg p.o. daily.  6. Zyrtec p.r.n.  7. Citalopram 5 mg p.o. daily.  8. Glimepiride 1 mg p.o. daily.  9. Multivitamin daily.  10.Nexium 20 mg daily.  11.Gabapentin 300 mg p.o. t.i.d.  12.Cetirizine 10 mg daily p.r.n.  13.Vitamin D 400 units daily.  14.Crestor 10 mg daily.   15.Percocet 5/325 mg 1-2 tablets q.6 h. p.r.n.  16.Valtrex 1 g p.o. t.i.d.   Duration of discharge encounter including physician time was 35 minutes.      Jarrett Ables, [facility]      [name], MD, [facility]  Electronically Signed    MS/MEDQ  D:  08/29/2008  T:  08/30/2008  Job:  [id number]   cc:   Charlaine Dalton. Sherene Sires, MD, [facility]  Corwin Levins, MD

## 2010-07-08 NOTE — Assessment & Plan Note (Signed)
[facility]                            CARDIOLOGY OFFICE NOTE   CHRSITOPHER, WIK                     MRN:          [id number]  DATE:09/26/2007                            DOB:          Sep 18, 1933    PRIMARY CARE PHYSICIAN:  Corwin Levins, MD   REASON FOR PRESENTATION:  Evaluate the patient with nonischemic  myopathy.   HISTORY OF PRESENT ILLNESS:  The patient is 77 years old.  He presents  for yearly followup.  He had a previous ejection fraction of 35%,  nonischemic.  It improved to 50% on a cath, on an echo in 2005.  He has  since had no symptoms of shortness of breath such as he had when he  first presented.  He does all of his yard work including trimming trees.  He does not have any dyspnea.  He denies any PND or orthopnea.  He has  had no palpitation, presyncope or syncope.  He has no chest discomfort,  neck or arm discomfort.  Of note, he does take his lisinopril on 1 day  and his carvedilol on another day.  He never takes them together as  feels lightheaded.   PAST MEDICAL HISTORY:  Cardiomyopathy of unclear etiology (latest EF was  50% in 2005), rheumatoid arthritis, peptic ulcer disease and  hypertension.   ALLERGIES:  PENICILLIN.   PRESENT MEDICATIONS:  1. Bisoprolol 10 mg every other day.  2. Lisinopril 40 mg every other day.  3. Aspirin 81 mg daily.  4. Methotrexate 2.5 mg weekly.  5. Folic acid.  6. Zyrtec.  7. Citalopram.  8. Glimepiride 0.5 mg daily.  9. Multivitamin.  10.Nexium 40 mg every other day.   REVIEW OF SYSTEMS:  As stated in the HPI and otherwise for other  systems.   PHYSICAL EXAMINATION:  GENERAL:  The patient is in no distress.  VITAL SIGNS:  Blood pressure 139/94, heart rate 88 and regular, weight  183 pounds, and body mass index 30.  NECK:  No jugular distention at 45 degrees, carotid upstroke brisk and  symmetrical, no bruits, no thyromegaly.  LUNGS:  Clear to auscultation bilaterally.  BACK:  No  costovertebral angle tenderness.  CHEST:  Unremarkable.  HEART:  PMI not displaced or sustained, S1and S2 within normal limits,  no S3, no S4, no clicks, no rubs, no murmurs.  ABDOMEN:  Mildly obese, positive bowel sounds normal in frequency and  pitch, no bruits, no rebound, no guarding, no midline pulsatile mass, no  organomegaly.  SKIN:  No rashes, no nodule.  EXTREMITIES:  Pulses 2+, no edema.   EKG sinus rhythm, rate 84, axis within normal limits, intervals within  normal limits, no acute ST-wave change.   ASSESSMENT AND PLAN:  1. Cardiomyopathy.  The patient seems to have no symptoms at this      point.  I am going to have him take 5 mg of the bisoprolol and 20      mg of lisinopril every day instead of doubling the dose and taking      them on alternate days.  This  makes more sense physiologically.  If      he tolerates this when he calls back for renewal on his      prescriptions, we should renew the 5 mg bisoprolol and 20 mg      lisinopril daily.  Otherwise I will make no change to his medical      regimen.  No further cardiovascular testing is warranted at this      point.  2. Hypertension.  Blood pressure is very slightly elevated today, but      he takes at home and it is well-controlled.  I will make no changes      based on this and we will go by his home readings.  3. Dyslipidemia.  Do note that he had an LDL of 125.  Given his      diabetes, the goal should be at least less than 100.  Based on the      heart protection study, he would benefit from a statin.  I will      defer to Dr. Oliver Barre, but make this suggestion.  4. Follow up.  I will see him back in 1 year or sooner if needed.     [name], MD, [facility]  Electronically Signed    JH/MedQ  DD: 09/26/2007  DT: 09/27/2007  Job #: [id number]   cc:   Corwin Levins, MD

## 2011-06-03 ENCOUNTER — Other Ambulatory Visit: Payer: Self-pay | Admitting: Cardiology

## 2011-06-03 ENCOUNTER — Ambulatory Visit
Admission: RE | Admit: 2011-06-03 | Discharge: 2011-06-03 | Disposition: A | Discharge status: Home / Self Care | Payer: Medicare Other | Source: Ambulatory Visit | Attending: Cardiology | Admitting: Cardiology

## 2011-06-03 DIAGNOSIS — J8410 Pulmonary fibrosis, unspecified: Secondary | ICD-10-CM

## 2011-07-06 ENCOUNTER — Encounter (HOSPITAL_COMMUNITY): Payer: Self-pay | Admitting: Respiratory Therapy

## 2011-07-14 ENCOUNTER — Encounter (HOSPITAL_COMMUNITY)
Admission: RE | Disposition: A | Discharge status: Home / Self Care | Payer: Self-pay | Source: Ambulatory Visit | Attending: Cardiology

## 2011-07-14 ENCOUNTER — Ambulatory Visit (HOSPITAL_COMMUNITY)
Admission: RE | Admit: 2011-07-14 | Discharge: 2011-07-14 | Disposition: A | Discharge status: Home / Self Care | Payer: Medicare Other | Source: Ambulatory Visit | Attending: Cardiology | Admitting: Cardiology

## 2011-07-14 DIAGNOSIS — R0989 Other specified symptoms and signs involving the circulatory and respiratory systems: Secondary | ICD-10-CM | POA: Insufficient documentation

## 2011-07-14 DIAGNOSIS — I2789 Other specified pulmonary heart diseases: Secondary | ICD-10-CM | POA: Insufficient documentation

## 2011-07-14 DIAGNOSIS — I10 Essential (primary) hypertension: Secondary | ICD-10-CM | POA: Insufficient documentation

## 2011-07-14 DIAGNOSIS — E119 Type 2 diabetes mellitus without complications: Secondary | ICD-10-CM | POA: Insufficient documentation

## 2011-07-14 DIAGNOSIS — R0609 Other forms of dyspnea: Secondary | ICD-10-CM | POA: Insufficient documentation

## 2011-07-14 DIAGNOSIS — E669 Obesity, unspecified: Secondary | ICD-10-CM | POA: Insufficient documentation

## 2011-07-14 DIAGNOSIS — M069 Rheumatoid arthritis, unspecified: Secondary | ICD-10-CM | POA: Insufficient documentation

## 2011-07-14 DIAGNOSIS — E785 Hyperlipidemia, unspecified: Secondary | ICD-10-CM | POA: Insufficient documentation

## 2011-07-14 HISTORY — PX: LEFT AND RIGHT HEART CATHETERIZATION WITH CORONARY ANGIOGRAM: SHX5449

## 2011-07-14 LAB — POCT I-STAT 3, ART BLOOD GAS (G3+)
Acid-base deficit: 4 mmol/L — ABNORMAL HIGH (ref 0.0–2.0)
Bicarbonate: 20.3 mEq/L (ref 20.0–24.0)
O2 Saturation: 87 %
TCO2: 21 mmol/L (ref 0–100)
pCO2 arterial: 33 mmHg — ABNORMAL LOW (ref 35.0–45.0)
pH, Arterial: 7.398 (ref 7.350–7.450)
pO2, Arterial: 52 mmHg — ABNORMAL LOW (ref 80.0–100.0)

## 2011-07-14 LAB — POCT I-STAT 3, VENOUS BLOOD GAS (G3P V)
Acid-base deficit: 3 mmol/L — ABNORMAL HIGH (ref 0.0–2.0)
Bicarbonate: 19.9 mEq/L — ABNORMAL LOW (ref 20.0–24.0)
Bicarbonate: 21.7 mEq/L (ref 20.0–24.0)
O2 Saturation: 64 %
O2 Saturation: 66 %
TCO2: 21 mmol/L (ref 0–100)
TCO2: 23 mmol/L (ref 0–100)
pCO2, Ven: 36.6 mmHg — ABNORMAL LOW (ref 45.0–50.0)
pH, Ven: 7.382 — ABNORMAL HIGH (ref 7.250–7.300)
pH, Ven: 7.39 — ABNORMAL HIGH (ref 7.250–7.300)
pO2, Ven: 33 mmHg (ref 30.0–45.0)

## 2011-07-14 LAB — GLUCOSE, CAPILLARY: Glucose-Capillary: 113 mg/dL — ABNORMAL HIGH (ref 70–99)

## 2011-07-14 SURGERY — LEFT AND RIGHT HEART CATHETERIZATION WITH CORONARY ANGIOGRAM
Anesthesia: LOCAL

## 2011-07-14 MED ORDER — ACETAMINOPHEN 325 MG PO TABS: 650.0000 mg | ORAL_TABLET | ORAL | Status: DC | PRN

## 2011-07-14 MED ORDER — SODIUM CHLORIDE 0.9 % IV SOLN: INTRAVENOUS | Status: DC

## 2011-07-14 MED ORDER — ASPIRIN 81 MG PO CHEW
CHEWABLE_TABLET | ORAL | Status: AC
  Administered 2011-07-14: 324 mg via ORAL
  Filled 2011-07-14: qty 4

## 2011-07-14 MED ORDER — ASPIRIN 81 MG PO CHEW
324.0000 mg | CHEWABLE_TABLET | ORAL | Status: AC
  Administered 2011-07-14: 324 mg via ORAL

## 2011-07-14 MED ORDER — ONDANSETRON HCL 4 MG/2ML IJ SOLN: 4.0000 mg | Freq: Four times a day (QID) | INTRAMUSCULAR | Status: DC | PRN

## 2011-07-14 MED ORDER — FENTANYL CITRATE 0.05 MG/ML IJ SOLN
INTRAMUSCULAR | Status: AC
  Filled 2011-07-14: qty 2

## 2011-07-14 MED ORDER — MIDAZOLAM HCL 2 MG/2ML IJ SOLN
INTRAMUSCULAR | Status: AC
  Filled 2011-07-14: qty 2

## 2011-07-14 MED ORDER — LIDOCAINE HCL (PF) 1 % IJ SOLN
INTRAMUSCULAR | Status: AC
  Filled 2011-07-14: qty 30

## 2011-07-14 MED ORDER — SODIUM CHLORIDE 0.9 % IJ SOLN: 3.0000 mL | INTRAMUSCULAR | Status: DC | PRN

## 2011-07-14 MED ORDER — SODIUM CHLORIDE 0.9 % IV SOLN: 250.0000 mL | INTRAVENOUS | Status: DC | PRN

## 2011-07-14 MED ORDER — SODIUM CHLORIDE 0.9 % IJ SOLN: 3.0000 mL | Freq: Two times a day (BID) | INTRAMUSCULAR | Status: DC

## 2011-07-14 MED ORDER — HEPARIN (PORCINE) IN NACL 2-0.9 UNIT/ML-% IJ SOLN
INTRAMUSCULAR | Status: AC
  Filled 2011-07-14: qty 2000

## 2011-07-14 MED ORDER — SODIUM CHLORIDE 0.9 % IV SOLN
INTRAVENOUS | Status: DC
  Administered 2011-07-14: 07:00:00 via INTRAVENOUS

## 2011-07-14 MED ORDER — NITROGLYCERIN 0.2 MG/ML ON CALL CATH LAB
INTRAVENOUS | Status: AC
  Filled 2011-07-14: qty 1

## 2011-07-14 NOTE — Interval H&P Note (Signed)
History and Physical Interval Note:  07/14/2011 9:16 AM  Howard Shepard  has presented today for surgery, with the diagnosis of r/o CAD  The various methods of treatment have been discussed with the patient and family. After consideration of risks, benefits and other options for treatment, the patient has consented to  Procedure(s) (LRB): LEFT AND RIGHT HEART CATHETERIZATION WITH CORONARY ANGIOGRAM (N/A) as a surgical intervention .  The patients' history has been reviewed, patient examined, no change in status, stable for surgery.  I have reviewed the patients' chart and labs.  Questions were answered to the patient's satisfaction.     Pamella Pert

## 2011-07-14 NOTE — H&P (Signed)
  Please see paper chart  

## 2011-07-14 NOTE — CV Procedure (Addendum)
Procedures performed: Right and left heart catheterization and occlusion of cardiac output and cardiac index by Fick. Right radial access and right antecubital vein access was utilized for performing the procedure.   Indication: Patient is a 78 year-old male with diabetes, hyperlipidemia, hypertension,   obesity  Presenting with dyspnea. Outpatient stress testing had revealed low risk stress, however, because of persistent symptoms and worsening dyspnea is brought to the cardiac catheterization lab to evaluate  coronary anatomy. Right heart catheterization being performed for evaluation of dyspnea and pulmonary hypertension and to evaluate cardiac output and cardiac index.  A 5 French Brachial sheath introduced into antecubital vein access. A 5 French Swan-Ganz catheter was advanced with balloon inflated on the sheath under fluoroscopic guidance into first the right atrium followed by the right ventricle and into the pulmonary artery to pulmonary artery wedge position. Hemodynamics were obtained in a locations.  After hemodynamics were completed, samples were taken for SaO2% measurement to be used in [facility] /Index catheterization.  The catheter was then pulled back the balloon down and then completely out of the body.   Left Heart Catheterization   First a 5 [place] TIG 4 catheter was advanced over standard J-wire into the ascending aorta and used to engage first the Left and Right Coronary Artery. Multiple cineangiographic views of the Left then Right Coronary Artery system(s) were performed.  A 5 TIG 4 catheter which was used to cross the aortic valve for measurement Left Ventricular Hemodynamics.  Hemodynamics were then resampled and the catheter pulled back across the aortic valve for measurement of "pullback" gradient. The catheter was then removed the body over wire. All exchanges were made over standard J wire.   Procedural data:  RA pressure 10/11  Mean 10 mm mercury. RA saturation 66%.  RV  pressure 26/6 and Right ventricular EDP 10 mm Hg. PA pressure 32/20 with a mean of 24  mm mercury. PA saturation 64%.  Pulmonary capillary wedge 14/15 with a mean of 12 mm Hg. Aortic saturation 87%.  Cardiac output was 6.98 with cardiac index of 3.5  by Fick.   Angiographic data  Left ventricle: Not performed due to renal insufficiency.  LV pressure 115/3, EDP 9 mm Hg  Right coronary artery: Smooth, dominant and normal   Left main coronary artery: Normal. No stenosis.   LAD: Large, smooth and normal except in the mid segment there is a smooth 30-40% stenosis. Gives origin to a moderate sized diagonal 1 which is smooth and normal.   Circumflex coronary artery: Smooth normal. nondominant.  Impression: Normal coronary arteries.   Very mild pulmonary hypertension with preserved cardiac output and cardiac index.  Recommendation: Evaluation for pulmonary causes for dyspnea should be entertained. Patient will be followed up in the office in 2 weeks for right radial and right antecubital vein access site evaluation. There was no immediate complication.  45-50 cc contrast used.

## 2011-07-14 NOTE — Discharge Instructions (Signed)
Radial Site Care Refer to this sheet in the next few weeks. These instructions provide you with information on caring for yourself after your procedure. Your caregiver may also give you more specific instructions. Your treatment has been planned according to current medical practices, but problems sometimes occur. Call your caregiver if you have any problems or questions after your procedure. HOME CARE INSTRUCTIONS  You may shower the day after the procedure.Remove the bandage (dressing) and gently wash the site with plain soap and water.Gently pat the site dry.   Do not apply powder or lotion to the site.   Do not submerge the affected site in water for 3 to 5 days.   Inspect the site at least twice daily.   Do not flex or bend the affected arm for 24 hours.   No lifting over 5 pounds (2.3 kg) for 5 days after your procedure.   Do not drive home if you are discharged the same day of the procedure. Have someone else drive you.   You may drive 24 hours after the procedure unless otherwise instructed by your caregiver.   Do not operate machinery or power tools for 24 hours.   A responsible adult should be with you for the first 24 hours after you arrive home.  What to expect:  Any bruising will usually fade within 1 to 2 weeks.   Blood that collects in the tissue (hematoma) may be painful to the touch. It should usually decrease in size and tenderness within 1 to 2 weeks.  SEEK IMMEDIATE MEDICAL CARE IF:  You have unusual pain at the radial site.   You have redness, warmth, swelling, or pain at the radial site.   You have drainage (other than a small amount of blood on the dressing).   You have chills.   You have a fever or persistent symptoms for more than 72 hours.   You have a fever and your symptoms suddenly get worse.   Your arm becomes pale, cool, tingly, or numb.   You have heavy bleeding from the site. Hold pressure on the site.  Document Released: 03/14/2010  Document Revised: 01/29/2011 Document Reviewed: 03/14/2010 [employer] Patient Information 2012 [employer]. 

## 2011-07-15 MED FILL — Nicardipine HCl IV Soln 2.5 MG/ML: INTRAVENOUS | Qty: 1 | Status: AC

## 2011-07-22 ENCOUNTER — Other Ambulatory Visit: Payer: Self-pay | Admitting: Internal Medicine

## 2011-07-22 DIAGNOSIS — R55 Syncope and collapse: Secondary | ICD-10-CM

## 2011-07-27 ENCOUNTER — Ambulatory Visit
Admission: RE | Admit: 2011-07-27 | Discharge: 2011-07-27 | Disposition: A | Discharge status: Home / Self Care | Payer: Medicare Other | Source: Ambulatory Visit | Attending: Internal Medicine | Admitting: Internal Medicine

## 2011-07-27 DIAGNOSIS — R55 Syncope and collapse: Secondary | ICD-10-CM

## 2012-04-13 ENCOUNTER — Other Ambulatory Visit: Payer: Self-pay | Admitting: Internal Medicine

## 2012-04-13 DIAGNOSIS — N179 Acute kidney failure, unspecified: Secondary | ICD-10-CM

## 2012-04-19 ENCOUNTER — Ambulatory Visit
Admission: RE | Admit: 2012-04-19 | Discharge: 2012-04-19 | Disposition: A | Discharge status: Home / Self Care | Payer: Medicare Other | Source: Ambulatory Visit | Attending: Internal Medicine | Admitting: Internal Medicine

## 2012-04-19 DIAGNOSIS — N179 Acute kidney failure, unspecified: Secondary | ICD-10-CM

## 2013-03-15 ENCOUNTER — Other Ambulatory Visit: Payer: Self-pay | Admitting: Internal Medicine

## 2013-03-15 DIAGNOSIS — R42 Dizziness and giddiness: Secondary | ICD-10-CM

## 2013-03-21 ENCOUNTER — Ambulatory Visit
Admission: RE | Admit: 2013-03-21 | Discharge: 2013-03-21 | Disposition: A | Discharge status: Home / Self Care | Payer: Medicare Other | Source: Ambulatory Visit | Attending: Internal Medicine | Admitting: Internal Medicine

## 2013-03-21 DIAGNOSIS — R42 Dizziness and giddiness: Secondary | ICD-10-CM

## 2013-03-21 MED ORDER — GADOBENATE DIMEGLUMINE 529 MG/ML IV SOLN
17.0000 mL | Freq: Once | INTRAVENOUS | Status: AC | PRN
  Administered 2013-03-21: 17 mL via INTRAVENOUS

## 2014-02-01 ENCOUNTER — Encounter (HOSPITAL_COMMUNITY): Payer: Self-pay | Admitting: Cardiology

## 2014-12-21 ENCOUNTER — Other Ambulatory Visit: Payer: Self-pay | Admitting: Internal Medicine

## 2014-12-21 DIAGNOSIS — R945 Abnormal results of liver function studies: Secondary | ICD-10-CM

## 2014-12-27 ENCOUNTER — Ambulatory Visit
Admission: RE | Admit: 2014-12-27 | Discharge: 2014-12-27 | Disposition: A | Discharge status: Home / Self Care | Payer: Medicare Other | Source: Ambulatory Visit | Attending: Internal Medicine | Admitting: Internal Medicine

## 2014-12-27 DIAGNOSIS — R945 Abnormal results of liver function studies: Secondary | ICD-10-CM

## 2015-01-01 ENCOUNTER — Other Ambulatory Visit: Payer: Medicare Other

## 2016-05-08 DIAGNOSIS — Z79899 Other long term (current) drug therapy: Secondary | ICD-10-CM | POA: Diagnosis not present

## 2016-05-08 DIAGNOSIS — Z7902 Long term (current) use of antithrombotics/antiplatelets: Secondary | ICD-10-CM | POA: Diagnosis not present

## 2016-05-08 DIAGNOSIS — E1122 Type 2 diabetes mellitus with diabetic chronic kidney disease: Secondary | ICD-10-CM | POA: Diagnosis not present

## 2016-05-08 DIAGNOSIS — M545 Low back pain: Secondary | ICD-10-CM | POA: Diagnosis not present

## 2016-05-08 DIAGNOSIS — Z794 Long term (current) use of insulin: Secondary | ICD-10-CM | POA: Diagnosis not present

## 2016-05-08 DIAGNOSIS — R262 Difficulty in walking, not elsewhere classified: Secondary | ICD-10-CM | POA: Diagnosis not present

## 2016-05-08 DIAGNOSIS — M069 Rheumatoid arthritis, unspecified: Secondary | ICD-10-CM | POA: Diagnosis not present

## 2016-05-08 DIAGNOSIS — N189 Chronic kidney disease, unspecified: Secondary | ICD-10-CM | POA: Diagnosis not present

## 2016-05-08 DIAGNOSIS — I509 Heart failure, unspecified: Secondary | ICD-10-CM | POA: Diagnosis not present

## 2016-05-08 DIAGNOSIS — Z7982 Long term (current) use of aspirin: Secondary | ICD-10-CM | POA: Diagnosis not present

## 2016-05-08 DIAGNOSIS — H9190 Unspecified hearing loss, unspecified ear: Secondary | ICD-10-CM | POA: Diagnosis not present

## 2016-05-08 DIAGNOSIS — I130 Hypertensive heart and chronic kidney disease with heart failure and stage 1 through stage 4 chronic kidney disease, or unspecified chronic kidney disease: Secondary | ICD-10-CM | POA: Diagnosis not present

## 2016-05-12 DIAGNOSIS — E1122 Type 2 diabetes mellitus with diabetic chronic kidney disease: Secondary | ICD-10-CM | POA: Diagnosis not present

## 2016-05-12 DIAGNOSIS — I509 Heart failure, unspecified: Secondary | ICD-10-CM | POA: Diagnosis not present

## 2016-05-12 DIAGNOSIS — M545 Low back pain: Secondary | ICD-10-CM | POA: Diagnosis not present

## 2016-05-12 DIAGNOSIS — N189 Chronic kidney disease, unspecified: Secondary | ICD-10-CM | POA: Diagnosis not present

## 2016-05-12 DIAGNOSIS — R262 Difficulty in walking, not elsewhere classified: Secondary | ICD-10-CM | POA: Diagnosis not present

## 2016-05-12 DIAGNOSIS — H9190 Unspecified hearing loss, unspecified ear: Secondary | ICD-10-CM | POA: Diagnosis not present

## 2016-05-12 DIAGNOSIS — Z794 Long term (current) use of insulin: Secondary | ICD-10-CM | POA: Diagnosis not present

## 2016-05-12 DIAGNOSIS — I130 Hypertensive heart and chronic kidney disease with heart failure and stage 1 through stage 4 chronic kidney disease, or unspecified chronic kidney disease: Secondary | ICD-10-CM | POA: Diagnosis not present

## 2016-05-12 DIAGNOSIS — M069 Rheumatoid arthritis, unspecified: Secondary | ICD-10-CM | POA: Diagnosis not present

## 2016-05-12 DIAGNOSIS — Z7902 Long term (current) use of antithrombotics/antiplatelets: Secondary | ICD-10-CM | POA: Diagnosis not present

## 2016-05-12 DIAGNOSIS — Z7982 Long term (current) use of aspirin: Secondary | ICD-10-CM | POA: Diagnosis not present

## 2016-05-12 DIAGNOSIS — Z79899 Other long term (current) drug therapy: Secondary | ICD-10-CM | POA: Diagnosis not present

## 2016-10-09 ENCOUNTER — Emergency Department (HOSPITAL_COMMUNITY)
Admission: EM | Admit: 2016-10-09 | Discharge: 2016-10-09 | Disposition: A | Discharge status: Home / Self Care | Payer: Medicare Other | Attending: Emergency Medicine | Admitting: Emergency Medicine

## 2016-10-09 ENCOUNTER — Encounter (HOSPITAL_COMMUNITY): Payer: Self-pay | Admitting: Nurse Practitioner

## 2016-10-09 ENCOUNTER — Emergency Department (HOSPITAL_COMMUNITY): Payer: Medicare Other

## 2016-10-09 DIAGNOSIS — R51 Headache: Secondary | ICD-10-CM | POA: Diagnosis not present

## 2016-10-09 DIAGNOSIS — I10 Essential (primary) hypertension: Secondary | ICD-10-CM | POA: Diagnosis not present

## 2016-10-09 DIAGNOSIS — Z79899 Other long term (current) drug therapy: Secondary | ICD-10-CM | POA: Insufficient documentation

## 2016-10-09 DIAGNOSIS — S0990XA Unspecified injury of head, initial encounter: Secondary | ICD-10-CM | POA: Diagnosis not present

## 2016-10-09 DIAGNOSIS — I509 Heart failure, unspecified: Secondary | ICD-10-CM | POA: Insufficient documentation

## 2016-10-09 DIAGNOSIS — E119 Type 2 diabetes mellitus without complications: Secondary | ICD-10-CM | POA: Insufficient documentation

## 2016-10-09 DIAGNOSIS — Y999 Unspecified external cause status: Secondary | ICD-10-CM | POA: Insufficient documentation

## 2016-10-09 DIAGNOSIS — Y929 Unspecified place or not applicable: Secondary | ICD-10-CM | POA: Diagnosis not present

## 2016-10-09 DIAGNOSIS — Y939 Activity, unspecified: Secondary | ICD-10-CM | POA: Insufficient documentation

## 2016-10-09 DIAGNOSIS — Z794 Long term (current) use of insulin: Secondary | ICD-10-CM | POA: Insufficient documentation

## 2016-10-09 DIAGNOSIS — Z7902 Long term (current) use of antithrombotics/antiplatelets: Secondary | ICD-10-CM | POA: Insufficient documentation

## 2016-10-09 DIAGNOSIS — T148XXA Other injury of unspecified body region, initial encounter: Secondary | ICD-10-CM | POA: Diagnosis not present

## 2016-10-09 DIAGNOSIS — W19XXXA Unspecified fall, initial encounter: Secondary | ICD-10-CM | POA: Insufficient documentation

## 2016-10-09 DIAGNOSIS — M25562 Pain in left knee: Secondary | ICD-10-CM | POA: Insufficient documentation

## 2016-10-09 DIAGNOSIS — M7989 Other specified soft tissue disorders: Secondary | ICD-10-CM | POA: Diagnosis not present

## 2016-10-09 LAB — URINALYSIS, ROUTINE W REFLEX MICROSCOPIC
Bilirubin Urine: NEGATIVE
Glucose, UA: NEGATIVE mg/dL
Hgb urine dipstick: NEGATIVE
Ketones, ur: 5 mg/dL — AB
Leukocytes, UA: NEGATIVE
Nitrite: NEGATIVE
Protein, ur: NEGATIVE mg/dL
Specific Gravity, Urine: 1.024 (ref 1.005–1.030)
pH: 5 (ref 5.0–8.0)

## 2016-10-09 LAB — CBC WITH DIFFERENTIAL/PLATELET
Basophils Absolute: 0 10*3/uL (ref 0.0–0.1)
Basophils Relative: 0 %
Eosinophils Absolute: 0.1 10*3/uL (ref 0.0–0.7)
Eosinophils Relative: 1 %
HCT: 40.8 % (ref 39.0–52.0)
Hemoglobin: 14.4 g/dL (ref 13.0–17.0)
Lymphocytes Relative: 14 %
Lymphs Abs: 1.7 10*3/uL (ref 0.7–4.0)
MCH: 31.4 pg (ref 26.0–34.0)
MCHC: 35.3 g/dL (ref 30.0–36.0)
MCV: 89.1 fL (ref 78.0–100.0)
Monocytes Absolute: 1.1 10*3/uL — ABNORMAL HIGH (ref 0.1–1.0)
Monocytes Relative: 9 %
Neutro Abs: 8.7 10*3/uL — ABNORMAL HIGH (ref 1.7–7.7)
Neutrophils Relative %: 76 %
Platelets: 243 10*3/uL (ref 150–400)
RBC: 4.58 MIL/uL (ref 4.22–5.81)
RDW: 12.9 % (ref 11.5–15.5)
WBC: 11.5 10*3/uL — ABNORMAL HIGH (ref 4.0–10.5)

## 2016-10-09 LAB — BASIC METABOLIC PANEL
Anion gap: 10 (ref 5–15)
BUN: 14 mg/dL (ref 6–20)
CO2: 21 mmol/L — ABNORMAL LOW (ref 22–32)
Calcium: 8.8 mg/dL — ABNORMAL LOW (ref 8.9–10.3)
Chloride: 105 mmol/L (ref 101–111)
Creatinine, Ser: 1.19 mg/dL (ref 0.61–1.24)
GFR calc Af Amer: >60 mL/min (ref >60)
GFR calc non Af Amer: 55 mL/min — ABNORMAL LOW (ref >60)
Glucose, Bld: 222 mg/dL — ABNORMAL HIGH (ref 65–99)
Potassium: 4.2 mmol/L (ref 3.5–5.1)
Sodium: 136 mmol/L (ref 135–145)

## 2016-10-09 MED ORDER — MORPHINE SULFATE (PF) 2 MG/ML IV SOLN
4.0000 mg | Freq: Once | INTRAVENOUS | Status: AC
  Administered 2016-10-09: 4 mg via INTRAVENOUS
  Filled 2016-10-09: qty 2

## 2016-10-09 MED ORDER — BUPIVACAINE HCL (PF) 0.25 % IJ SOLN
10.0000 mL | Freq: Once | INTRAMUSCULAR | Status: AC
  Administered 2016-10-09: 10 mL
  Filled 2016-10-09: qty 30

## 2016-10-09 MED ORDER — KETOROLAC TROMETHAMINE 15 MG/ML IJ SOLN
15.0000 mg | Freq: Once | INTRAMUSCULAR | Status: AC
  Administered 2016-10-09: 15 mg via INTRAVENOUS
  Filled 2016-10-09: qty 1

## 2016-10-09 MED ORDER — BUPIVACAINE HCL 0.25 % IJ SOLN
10.0000 mL | Freq: Once | INTRAMUSCULAR | Status: DC
  Filled 2016-10-09: qty 10

## 2016-10-09 NOTE — ED Provider Notes (Signed)
83 year old male presents with knee pain after a fall yesterday. Xrays were negative. He was given a steroid injection by Dr. Juleen China and had some relief of pain. He was able to ambulate in the ED with minimal assistance and has a walker at home. As I was getting ready to discharge him the family complained to nursing staff that the patient started to have chest pain.  On my evaluation the patient is calm, comfortable. The patient states that he has not taken his Prilosec today and it feels like gas pain. EKG was obtained which showed LBBB without evidence of STEMI. He will be discharged with home health. Advised close follow up with PCP.   Howard Born, PA-C 10/09/16 1739    Maia Plan, MD 10/09/16 [phone]

## 2016-10-09 NOTE — ED Notes (Signed)
Changed pt's clothes, underwear, and bedding.

## 2016-10-09 NOTE — ED Provider Notes (Signed)
[facility]-EMERGENCY DEPT Provider Note   CSN: [id number] Arrival date & time: 10/09/16  9476     History   Chief Complaint No chief complaint on file.   HPI Howard Shepard is a 83 y.o. male.  HPI Patient presents to the emergency department with left knee pain following a fall that occurred yesterday.  The patient also had another fall last night.  Patient's complaining of left knee pain only some mild tenderness in the pelvic area.  Patient states that he is having difficulty walking due to pain. The patient denies chest pain, shortness of breath, headache,blurred vision, neck pain, fever, cough, weakness, numbness, dizziness, anorexia, edema, abdominal pain, nausea, vomiting, diarrhea, rash, back pain, dysuria, hematemesis, bloody stool, near syncope, or syncope. History reviewed. No pertinent past medical history.  Patient Active Problem List   Diagnosis Date Noted  . SHINGLES 07/16/2008  . LUMBAR RADICULOPATHY, LEFT 04/30/2008  . ABDOMINAL PAIN, LEFT LOWER QUADRANT 09/07/2007  . HYPERLIPIDEMIA 02/28/2007  . HYPERTENSION 02/28/2007  . GERD 02/28/2007  . SKIN LESIONS, MULTIPLE 02/28/2007  . DIABETES MELLITUS, TYPE II 09/28/2006  . DEPRESSION 09/28/2006  . SLEEP APNEA, OBSTRUCTIVE 09/28/2006  . CONGESTIVE HEART FAILURE 09/28/2006  . HEMORRHOIDS, INTERNAL, WITH BLEEDING 09/28/2006  . ALLERGIC RHINITIS 09/28/2006  . Peptic ulcer, unspecified site, unspecified as acute or chronic, without mention of hemorrhage, perforation, or obstruction 09/28/2006  . ARTHRITIS, RHEUMATOID 09/28/2006  . LOW BACK PAIN 09/28/2006  . URINARY INCONTINENCE 09/28/2006  . TRANSIENT ISCHEMIC ATTACK, HX OF 09/28/2006  . COLONIC POLYPS, HX OF 09/28/2006  . Personal history of urinary calculi 09/28/2006    Past Surgical History:  Procedure Laterality Date  . LEFT AND RIGHT HEART CATHETERIZATION WITH CORONARY ANGIOGRAM N/A 07/14/2011   Procedure: LEFT AND RIGHT HEART CATHETERIZATION WITH CORONARY  ANGIOGRAM;  Surgeon: Pamella Pert, MD;  [facility] CATH LAB;  Service: Cardiovascular;  Laterality: N/A;       Home Medications    Prior to Admission medications   Medication Sig Start Date End Date Taking? Authorizing Provider  carvedilol (COREG) 12.5 MG tablet Take 12.5 mg by mouth 2 (two) times daily with a meal.   Yes [provider]  Chlorpheniramine-Pseudoeph (MINTEX PO) Take 1 capsule by mouth 2 (two) times daily.   Yes [provider]  clopidogrel (PLAVIX) 75 MG tablet Take 75 mg by mouth daily. 09/21/16  Yes [provider]  Cyanocobalamin (VITAMIN B 12 PO) Take 1 tablet by mouth daily.   Yes [provider]  donepezil (ARICEPT) 10 MG tablet Take 10 mg by mouth at bedtime.    Yes [provider]  folic acid (FOLVITE) 800 MCG tablet Take 800 mcg by mouth daily.   Yes [provider]  glipiZIDE (GLUCOTROL) 10 MG tablet Take 10 mg by mouth 2 (two) times daily before a meal.    Yes [provider]  Insulin Degludec (TRESIBA FLEXTOUCH) 200 UNIT/ML SOPN Inject 37 Units into the skin daily.   Yes [provider]  Magnesium 250 MG TABS Take 250 mg by mouth daily.   Yes [provider]  methotrexate (RHEUMATREX) 2.5 MG tablet Take 7.5 mg by mouth once a week. Caution:Chemotherapy. Protect from light.   Yes [provider]  Omega-3 Fatty Acids (FISH OIL PO) Take 1 capsule by mouth 2 (two) times daily.   Yes [provider]  omeprazole (PRILOSEC) 20 MG capsule Take 20 mg by mouth daily.   Yes [provider]  Prenatal Vit-Fe  Fumarate-FA (MULTIVITAMIN-PRENATAL) 27-0.8 MG TABS Take 1 tablet by mouth daily.   Yes [provider]  simvastatin (ZOCOR) 10 MG tablet Take 5 mg by mouth daily at 6 PM.   Yes [provider]  Tamsulosin HCl (FLOMAX) 0.4 MG CAPS Take 0.4 mg by mouth daily after supper.   Yes [provider]  saxagliptin HCl (ONGLYZA) 2.5 MG TABS  tablet Take 2.5 mg by mouth daily.    [provider]    Family History History reviewed. No pertinent family history.  Social History Social History  Substance Use Topics  . Smoking status: Unknown If Ever Smoked  . Smokeless tobacco: Not on file  . Alcohol use No     Allergies   Patient has no known allergies.   Review of Systems Review of Systems All other systems negative except as documented in the HPI. All pertinent positives and negatives as reviewed in the HPI.  Physical Exam Updated Vital Signs BP (!) 159/91   Pulse 84   Temp 98.5 F (36.9 C) (Oral)   Resp 18   SpO2 94%   Physical Exam  Constitutional: He is oriented to person, place, and time. He appears well-developed and well-nourished. No distress.  HENT:  Head: Normocephalic and atraumatic.  Mouth/Throat: Oropharynx is clear and moist.  Eyes: Pupils are equal, round, and reactive to light.  Neck: Normal range of motion. Neck supple.  Cardiovascular: Normal rate, regular rhythm and normal heart sounds.  Exam reveals no gallop and no friction rub.   No murmur heard. Pulmonary/Chest: Effort normal and breath sounds normal. No respiratory distress. He has no wheezes.  Abdominal: Soft. Bowel sounds are normal. He exhibits no distension. There is no tenderness.  Musculoskeletal:       Right hip: Normal.       Left hip: He exhibits normal range of motion, normal strength, no tenderness and no bony tenderness.       Left knee: He exhibits no ecchymosis. Tenderness found.  Neurological: He is alert and oriented to person, place, and time. He exhibits normal muscle tone. Coordination normal.  Skin: Skin is warm and dry. Capillary refill takes less than 2 seconds. No rash noted. No erythema.  Psychiatric: He has a normal mood and affect. His behavior is normal.  Nursing note and vitals reviewed.    ED Treatments / Results  Labs (all labs ordered are listed, but only abnormal results are  displayed) Labs Reviewed  BASIC METABOLIC PANEL - Abnormal; Notable for the following:       Result Value   CO2 21 (*)    Glucose, Bld 222 (*)    Calcium 8.8 (*)    GFR calc non Af Amer 55 (*)    All other components within normal limits  CBC WITH DIFFERENTIAL/PLATELET - Abnormal; Notable for the following:    WBC 11.5 (*)    Neutro Abs 8.7 (*)    Monocytes Absolute 1.1 (*)    All other components within normal limits  URINALYSIS, ROUTINE W REFLEX MICROSCOPIC - Abnormal; Notable for the following:    Ketones, ur 5 (*)    All other components within normal limits    EKG  EKG Interpretation None       Radiology Ct Head Wo Contrast  Result Date: 10/09/2016 CLINICAL DATA:  Minor head trauma initial exam.  Fall yesterday EXAM: CT HEAD WITHOUT CONTRAST TECHNIQUE: Contiguous axial images were obtained from the base of the skull through the vertex without intravenous  contrast. COMPARISON:  MRI head 03/21/2013 FINDINGS: Brain: Moderate atrophy. Chronic microvascular ischemic changes in the white matter. 10 mm left interhemispheric subdural hygroma unchanged from the prior MRI. 8 mm midline shift to the left also unchanged from the prior MRI. Negative for acute infarct, hemorrhage, or mass lesion. Vascular: Negative for hyperdense vessel Skull: Negative Sinuses/Orbits: Mucosal edema left maxillary sinus.  Normal orbit. Other: None IMPRESSION: Atrophy and chronic microvascular ischemia.  No acute abnormality. Electronically Signed   By: Marlan Palau M.D.   On: 10/09/2016 11:58   Dg Knee Complete 4 Views Left  Result Date: 10/09/2016 CLINICAL DATA:  Left knee swelling for 3 days EXAM: LEFT KNEE - COMPLETE 4+ VIEW COMPARISON:  None FINDINGS: Small suprapatellar joint effusion identified. There is mild degenerative joint disease with sharpening the tibial spines and medial compartment narrowing. No fracture or subluxation identified. IMPRESSION: 1. Small joint effusion. 2. Mild degenerative  joint disease Electronically Signed   By: Signa Kell M.D.   On: 10/09/2016 11:53   Dg Hip Unilat W Or Wo Pelvis 2-3 Views Left  Result Date: 10/09/2016 CLINICAL DATA:  Left knee swelling. EXAM: DG HIP (WITH OR WITHOUT PELVIS) 2-3V LEFT COMPARISON:  No recent prior . FINDINGS: Degenerative changes lumbar spine and both hips. Mild sclerotic changes noted both femoral heads. Avascular necrosis cannot be excluded. Pelvic calcifications consistent phleboliths. IMPRESSION: 1.  Degenerative changes lumbar spine and both hips. 2. Mild sclerotic changes noted both femoral heads. Bilateral avascular necrosis cannot be excluded. Electronically Signed   By: Maisie Fus  Register   On: 10/09/2016 11:53    Procedures Procedures (including critical care time)  Medications Ordered in ED Medications  morphine 2 MG/ML injection 4 mg (4 mg Intravenous Given 10/09/16 1605)  ketorolac (TORADOL) 15 MG/ML injection 15 mg (15 mg Intravenous Given 10/09/16 1605)  bupivacaine (PF) (MARCAINE) 0.25 % injection 10 mL (10 mLs Infiltration Given 10/09/16 1630)     Initial Impression / Assessment and Plan / ED Course  I have reviewed the triage vital signs and the nursing notes.  Pertinent labs & imaging results that were available during my care of the patient were reviewed by me and considered in my medical decision making (see chart for details).   patient will be given PT and OT home health referral, also have the patient follow up with his primary doctor.  Patient's x-rays did not show any abnormalities.  At this point.  Patient be ambulated to assess his ability to walk  Final Clinical Impressions(s) / ED Diagnoses   Final diagnoses:  None    New Prescriptions New Prescriptions   No medications on file     Charlestine Night, Cordelia Poche 10/09/16 1700    Raeford Razor, MD 10/28/16 [phone]

## 2016-10-09 NOTE — ED Notes (Signed)
Bed: JX91 Expected date:  Expected time:  Means of arrival:  Comments: 83 yo leg pain post fall yesterday

## 2016-10-09 NOTE — ED Triage Notes (Signed)
Patient coming from home left knee swelling for 3 days. Patient got up and fell on the left knee last night. Patient has has of fentanyl

## 2016-10-09 NOTE — ED Notes (Signed)
Family member walked up to nurse's station saying that pt was complaining of new chest pain.  PA Latanya Presser Aware.

## 2016-10-09 NOTE — Discharge Instructions (Signed)
Home health services will be set up for you. They will call you to arrange this Please give medicine for pain for knee pain.  Follow up with your doctor

## 2016-10-09 NOTE — Care Management Note (Signed)
Case Management Note  Patient Details  Name: Howard Shepard MRN: [id number] Date of Birth: 16-May-1933  Subjective/Objective:   83 year old male with hx of osteopetrosis and RA, reporting left knee swelling x 3 days with a fall last night.  Family reporting decreased mobility.               Action/Plan: CM consulted for HHS.  Spoke with pt, spouse and daughter who chose [facility].  Aundra Millet reports they will be able to accept pt for RN, PT, OT, NA, and SW. Updated Lawyer, PA and family.  Daughter, Dallyn Bergland states if no one answers the home phone to call her cell, [phone].  Place information on AVS for time of D/C. No further CM needs noted at this time.  Expected Discharge Date:                  Expected Discharge Plan:  Home w Home Health Services  Discharge planning Services  CM Consult  Post Acute Care Choice:  Home Health Choice offered to:  Patient, Adult Children, Spouse  HH Arranged:  RN, PT, OT, Nurse's Aide, Social Work [employer]:  [facility]  Status of Service:  Completed, signed off  Lauris Poag, RN 10/09/2016, 4:32 PM

## 2016-10-09 NOTE — ED Notes (Signed)
Pt able to walk with little assistance from Clinical research associate but states he is in terrible pain while walking.

## 2016-10-09 NOTE — ED Notes (Signed)
EKG done, PA satisfied with her own cardiac assessment and will d/c patient.

## 2016-10-15 DIAGNOSIS — I509 Heart failure, unspecified: Secondary | ICD-10-CM | POA: Diagnosis not present

## 2016-10-15 DIAGNOSIS — R262 Difficulty in walking, not elsewhere classified: Secondary | ICD-10-CM | POA: Diagnosis not present

## 2016-10-15 DIAGNOSIS — I10 Essential (primary) hypertension: Secondary | ICD-10-CM | POA: Diagnosis not present

## 2016-10-20 DIAGNOSIS — I509 Heart failure, unspecified: Secondary | ICD-10-CM | POA: Diagnosis not present

## 2016-10-20 DIAGNOSIS — R262 Difficulty in walking, not elsewhere classified: Secondary | ICD-10-CM | POA: Diagnosis not present

## 2016-10-20 DIAGNOSIS — M25511 Pain in right shoulder: Secondary | ICD-10-CM | POA: Diagnosis not present

## 2016-10-20 DIAGNOSIS — G8929 Other chronic pain: Secondary | ICD-10-CM | POA: Diagnosis not present

## 2016-10-20 DIAGNOSIS — I10 Essential (primary) hypertension: Secondary | ICD-10-CM | POA: Diagnosis not present

## 2016-10-20 DIAGNOSIS — R2681 Unsteadiness on feet: Secondary | ICD-10-CM | POA: Diagnosis not present

## 2016-10-22 DIAGNOSIS — I10 Essential (primary) hypertension: Secondary | ICD-10-CM | POA: Diagnosis not present

## 2016-10-22 DIAGNOSIS — I509 Heart failure, unspecified: Secondary | ICD-10-CM | POA: Diagnosis not present

## 2016-10-22 DIAGNOSIS — R262 Difficulty in walking, not elsewhere classified: Secondary | ICD-10-CM | POA: Diagnosis not present

## 2016-10-29 DIAGNOSIS — M810 Age-related osteoporosis without current pathological fracture: Secondary | ICD-10-CM | POA: Diagnosis not present

## 2016-10-29 DIAGNOSIS — I110 Hypertensive heart disease with heart failure: Secondary | ICD-10-CM | POA: Diagnosis not present

## 2016-10-29 DIAGNOSIS — I509 Heart failure, unspecified: Secondary | ICD-10-CM | POA: Diagnosis not present

## 2016-10-29 DIAGNOSIS — Z9181 History of falling: Secondary | ICD-10-CM | POA: Diagnosis not present

## 2016-10-29 DIAGNOSIS — E119 Type 2 diabetes mellitus without complications: Secondary | ICD-10-CM | POA: Diagnosis not present

## 2016-10-29 DIAGNOSIS — Z8673 Personal history of transient ischemic attack (TIA), and cerebral infarction without residual deficits: Secondary | ICD-10-CM | POA: Diagnosis not present

## 2016-10-29 DIAGNOSIS — M069 Rheumatoid arthritis, unspecified: Secondary | ICD-10-CM | POA: Diagnosis not present

## 2016-10-30 DIAGNOSIS — I509 Heart failure, unspecified: Secondary | ICD-10-CM | POA: Diagnosis not present

## 2016-10-30 DIAGNOSIS — Z8673 Personal history of transient ischemic attack (TIA), and cerebral infarction without residual deficits: Secondary | ICD-10-CM | POA: Diagnosis not present

## 2016-10-30 DIAGNOSIS — E119 Type 2 diabetes mellitus without complications: Secondary | ICD-10-CM | POA: Diagnosis not present

## 2016-10-30 DIAGNOSIS — M069 Rheumatoid arthritis, unspecified: Secondary | ICD-10-CM | POA: Diagnosis not present

## 2016-10-30 DIAGNOSIS — Z9181 History of falling: Secondary | ICD-10-CM | POA: Diagnosis not present

## 2016-10-30 DIAGNOSIS — I110 Hypertensive heart disease with heart failure: Secondary | ICD-10-CM | POA: Diagnosis not present

## 2016-10-30 DIAGNOSIS — M810 Age-related osteoporosis without current pathological fracture: Secondary | ICD-10-CM | POA: Diagnosis not present

## 2016-11-04 DIAGNOSIS — I509 Heart failure, unspecified: Secondary | ICD-10-CM | POA: Diagnosis not present

## 2016-11-04 DIAGNOSIS — Z9181 History of falling: Secondary | ICD-10-CM | POA: Diagnosis not present

## 2016-11-04 DIAGNOSIS — Z8673 Personal history of transient ischemic attack (TIA), and cerebral infarction without residual deficits: Secondary | ICD-10-CM | POA: Diagnosis not present

## 2016-11-04 DIAGNOSIS — M810 Age-related osteoporosis without current pathological fracture: Secondary | ICD-10-CM | POA: Diagnosis not present

## 2016-11-04 DIAGNOSIS — M069 Rheumatoid arthritis, unspecified: Secondary | ICD-10-CM | POA: Diagnosis not present

## 2016-11-04 DIAGNOSIS — I110 Hypertensive heart disease with heart failure: Secondary | ICD-10-CM | POA: Diagnosis not present

## 2016-11-04 DIAGNOSIS — E119 Type 2 diabetes mellitus without complications: Secondary | ICD-10-CM | POA: Diagnosis not present

## 2016-11-16 DIAGNOSIS — M069 Rheumatoid arthritis, unspecified: Secondary | ICD-10-CM | POA: Diagnosis not present

## 2016-11-16 DIAGNOSIS — E119 Type 2 diabetes mellitus without complications: Secondary | ICD-10-CM | POA: Diagnosis not present

## 2016-11-16 DIAGNOSIS — M810 Age-related osteoporosis without current pathological fracture: Secondary | ICD-10-CM | POA: Diagnosis not present

## 2016-11-16 DIAGNOSIS — Z9181 History of falling: Secondary | ICD-10-CM | POA: Diagnosis not present

## 2016-11-16 DIAGNOSIS — I509 Heart failure, unspecified: Secondary | ICD-10-CM | POA: Diagnosis not present

## 2016-11-16 DIAGNOSIS — I110 Hypertensive heart disease with heart failure: Secondary | ICD-10-CM | POA: Diagnosis not present

## 2016-11-16 DIAGNOSIS — Z8673 Personal history of transient ischemic attack (TIA), and cerebral infarction without residual deficits: Secondary | ICD-10-CM | POA: Diagnosis not present

## 2016-11-19 DIAGNOSIS — M069 Rheumatoid arthritis, unspecified: Secondary | ICD-10-CM | POA: Diagnosis not present

## 2016-11-19 DIAGNOSIS — M810 Age-related osteoporosis without current pathological fracture: Secondary | ICD-10-CM | POA: Diagnosis not present

## 2016-11-19 DIAGNOSIS — Z8673 Personal history of transient ischemic attack (TIA), and cerebral infarction without residual deficits: Secondary | ICD-10-CM | POA: Diagnosis not present

## 2016-11-19 DIAGNOSIS — Z9181 History of falling: Secondary | ICD-10-CM | POA: Diagnosis not present

## 2016-11-19 DIAGNOSIS — I110 Hypertensive heart disease with heart failure: Secondary | ICD-10-CM | POA: Diagnosis not present

## 2016-11-19 DIAGNOSIS — E119 Type 2 diabetes mellitus without complications: Secondary | ICD-10-CM | POA: Diagnosis not present

## 2016-11-19 DIAGNOSIS — I509 Heart failure, unspecified: Secondary | ICD-10-CM | POA: Diagnosis not present

## 2016-11-23 DIAGNOSIS — Z8673 Personal history of transient ischemic attack (TIA), and cerebral infarction without residual deficits: Secondary | ICD-10-CM | POA: Diagnosis not present

## 2016-11-23 DIAGNOSIS — E119 Type 2 diabetes mellitus without complications: Secondary | ICD-10-CM | POA: Diagnosis not present

## 2016-11-23 DIAGNOSIS — M810 Age-related osteoporosis without current pathological fracture: Secondary | ICD-10-CM | POA: Diagnosis not present

## 2016-11-23 DIAGNOSIS — M069 Rheumatoid arthritis, unspecified: Secondary | ICD-10-CM | POA: Diagnosis not present

## 2016-11-23 DIAGNOSIS — I509 Heart failure, unspecified: Secondary | ICD-10-CM | POA: Diagnosis not present

## 2016-11-23 DIAGNOSIS — I110 Hypertensive heart disease with heart failure: Secondary | ICD-10-CM | POA: Diagnosis not present

## 2016-11-23 DIAGNOSIS — Z9181 History of falling: Secondary | ICD-10-CM | POA: Diagnosis not present

## 2016-11-25 DIAGNOSIS — Z8673 Personal history of transient ischemic attack (TIA), and cerebral infarction without residual deficits: Secondary | ICD-10-CM | POA: Diagnosis not present

## 2016-11-25 DIAGNOSIS — I110 Hypertensive heart disease with heart failure: Secondary | ICD-10-CM | POA: Diagnosis not present

## 2016-11-25 DIAGNOSIS — E119 Type 2 diabetes mellitus without complications: Secondary | ICD-10-CM | POA: Diagnosis not present

## 2016-11-25 DIAGNOSIS — M810 Age-related osteoporosis without current pathological fracture: Secondary | ICD-10-CM | POA: Diagnosis not present

## 2016-11-25 DIAGNOSIS — I509 Heart failure, unspecified: Secondary | ICD-10-CM | POA: Diagnosis not present

## 2016-11-25 DIAGNOSIS — M069 Rheumatoid arthritis, unspecified: Secondary | ICD-10-CM | POA: Diagnosis not present

## 2016-11-25 DIAGNOSIS — Z9181 History of falling: Secondary | ICD-10-CM | POA: Diagnosis not present

## 2016-12-01 DIAGNOSIS — M069 Rheumatoid arthritis, unspecified: Secondary | ICD-10-CM | POA: Diagnosis not present

## 2016-12-01 DIAGNOSIS — E119 Type 2 diabetes mellitus without complications: Secondary | ICD-10-CM | POA: Diagnosis not present

## 2016-12-01 DIAGNOSIS — M810 Age-related osteoporosis without current pathological fracture: Secondary | ICD-10-CM | POA: Diagnosis not present

## 2016-12-01 DIAGNOSIS — I110 Hypertensive heart disease with heart failure: Secondary | ICD-10-CM | POA: Diagnosis not present

## 2016-12-01 DIAGNOSIS — Z8673 Personal history of transient ischemic attack (TIA), and cerebral infarction without residual deficits: Secondary | ICD-10-CM | POA: Diagnosis not present

## 2016-12-01 DIAGNOSIS — Z9181 History of falling: Secondary | ICD-10-CM | POA: Diagnosis not present

## 2016-12-01 DIAGNOSIS — I509 Heart failure, unspecified: Secondary | ICD-10-CM | POA: Diagnosis not present

## 2016-12-03 DIAGNOSIS — I110 Hypertensive heart disease with heart failure: Secondary | ICD-10-CM | POA: Diagnosis not present

## 2016-12-03 DIAGNOSIS — Z9181 History of falling: Secondary | ICD-10-CM | POA: Diagnosis not present

## 2016-12-03 DIAGNOSIS — M069 Rheumatoid arthritis, unspecified: Secondary | ICD-10-CM | POA: Diagnosis not present

## 2016-12-03 DIAGNOSIS — M810 Age-related osteoporosis without current pathological fracture: Secondary | ICD-10-CM | POA: Diagnosis not present

## 2016-12-03 DIAGNOSIS — I509 Heart failure, unspecified: Secondary | ICD-10-CM | POA: Diagnosis not present

## 2016-12-03 DIAGNOSIS — Z8673 Personal history of transient ischemic attack (TIA), and cerebral infarction without residual deficits: Secondary | ICD-10-CM | POA: Diagnosis not present

## 2016-12-03 DIAGNOSIS — E119 Type 2 diabetes mellitus without complications: Secondary | ICD-10-CM | POA: Diagnosis not present

## 2016-12-09 DIAGNOSIS — Z8673 Personal history of transient ischemic attack (TIA), and cerebral infarction without residual deficits: Secondary | ICD-10-CM | POA: Diagnosis not present

## 2016-12-09 DIAGNOSIS — E119 Type 2 diabetes mellitus without complications: Secondary | ICD-10-CM | POA: Diagnosis not present

## 2016-12-09 DIAGNOSIS — I110 Hypertensive heart disease with heart failure: Secondary | ICD-10-CM | POA: Diagnosis not present

## 2016-12-09 DIAGNOSIS — I509 Heart failure, unspecified: Secondary | ICD-10-CM | POA: Diagnosis not present

## 2016-12-09 DIAGNOSIS — M069 Rheumatoid arthritis, unspecified: Secondary | ICD-10-CM | POA: Diagnosis not present

## 2016-12-09 DIAGNOSIS — Z9181 History of falling: Secondary | ICD-10-CM | POA: Diagnosis not present

## 2016-12-09 DIAGNOSIS — M810 Age-related osteoporosis without current pathological fracture: Secondary | ICD-10-CM | POA: Diagnosis not present

## 2017-02-04 ENCOUNTER — Inpatient Hospital Stay (HOSPITAL_COMMUNITY)
Admission: EM | Admit: 2017-02-04 | Discharge: 2017-02-07 | DRG: 563 | Disposition: A | Discharge status: Home Health | Payer: Medicare Other | Attending: Nephrology | Admitting: Nephrology

## 2017-02-04 ENCOUNTER — Encounter (HOSPITAL_COMMUNITY): Payer: Self-pay

## 2017-02-04 DIAGNOSIS — I509 Heart failure, unspecified: Secondary | ICD-10-CM | POA: Diagnosis not present

## 2017-02-04 DIAGNOSIS — N183 Chronic kidney disease, stage 3 (moderate): Secondary | ICD-10-CM | POA: Diagnosis present

## 2017-02-04 DIAGNOSIS — M25572 Pain in left ankle and joints of left foot: Secondary | ICD-10-CM | POA: Diagnosis not present

## 2017-02-04 DIAGNOSIS — R5381 Other malaise: Secondary | ICD-10-CM | POA: Diagnosis present

## 2017-02-04 DIAGNOSIS — R52 Pain, unspecified: Secondary | ICD-10-CM

## 2017-02-04 DIAGNOSIS — R5383 Other fatigue: Secondary | ICD-10-CM | POA: Diagnosis not present

## 2017-02-04 DIAGNOSIS — I447 Left bundle-branch block, unspecified: Secondary | ICD-10-CM | POA: Diagnosis not present

## 2017-02-04 DIAGNOSIS — E785 Hyperlipidemia, unspecified: Secondary | ICD-10-CM | POA: Diagnosis not present

## 2017-02-04 DIAGNOSIS — S93492A Sprain of other ligament of left ankle, initial encounter: Principal | ICD-10-CM | POA: Diagnosis present

## 2017-02-04 DIAGNOSIS — X58XXXA Exposure to other specified factors, initial encounter: Secondary | ICD-10-CM | POA: Diagnosis present

## 2017-02-04 DIAGNOSIS — I130 Hypertensive heart and chronic kidney disease with heart failure and stage 1 through stage 4 chronic kidney disease, or unspecified chronic kidney disease: Secondary | ICD-10-CM | POA: Diagnosis not present

## 2017-02-04 DIAGNOSIS — R531 Weakness: Secondary | ICD-10-CM | POA: Diagnosis present

## 2017-02-04 DIAGNOSIS — R51 Headache: Secondary | ICD-10-CM | POA: Diagnosis not present

## 2017-02-04 DIAGNOSIS — E119 Type 2 diabetes mellitus without complications: Secondary | ICD-10-CM

## 2017-02-04 DIAGNOSIS — H9190 Unspecified hearing loss, unspecified ear: Secondary | ICD-10-CM | POA: Diagnosis not present

## 2017-02-04 DIAGNOSIS — Z87891 Personal history of nicotine dependence: Secondary | ICD-10-CM | POA: Diagnosis not present

## 2017-02-04 DIAGNOSIS — Z7902 Long term (current) use of antithrombotics/antiplatelets: Secondary | ICD-10-CM

## 2017-02-04 DIAGNOSIS — M069 Rheumatoid arthritis, unspecified: Secondary | ICD-10-CM | POA: Diagnosis not present

## 2017-02-04 DIAGNOSIS — M1990 Unspecified osteoarthritis, unspecified site: Secondary | ICD-10-CM | POA: Diagnosis present

## 2017-02-04 DIAGNOSIS — I10 Essential (primary) hypertension: Secondary | ICD-10-CM | POA: Diagnosis not present

## 2017-02-04 DIAGNOSIS — E1122 Type 2 diabetes mellitus with diabetic chronic kidney disease: Secondary | ICD-10-CM | POA: Diagnosis present

## 2017-02-04 DIAGNOSIS — Z7984 Long term (current) use of oral hypoglycemic drugs: Secondary | ICD-10-CM | POA: Diagnosis not present

## 2017-02-04 DIAGNOSIS — Z79899 Other long term (current) drug therapy: Secondary | ICD-10-CM | POA: Diagnosis not present

## 2017-02-04 DIAGNOSIS — N3000 Acute cystitis without hematuria: Secondary | ICD-10-CM

## 2017-02-04 DIAGNOSIS — G4733 Obstructive sleep apnea (adult) (pediatric): Secondary | ICD-10-CM | POA: Diagnosis not present

## 2017-02-04 DIAGNOSIS — F0390 Unspecified dementia without behavioral disturbance: Secondary | ICD-10-CM | POA: Diagnosis present

## 2017-02-04 HISTORY — DX: Essential (primary) hypertension: I10

## 2017-02-04 HISTORY — DX: Type 2 diabetes mellitus without complications: E11.9

## 2017-02-04 LAB — CBC
HEMATOCRIT: 41.1 % (ref 39.0–52.0)
HEMOGLOBIN: 14.3 g/dL (ref 13.0–17.0)
MCH: 32.5 pg (ref 26.0–34.0)
MCHC: 34.8 g/dL (ref 30.0–36.0)
MCV: 93.4 fL (ref 78.0–100.0)
Platelets: 240 10*3/uL (ref 150–400)
RBC: 4.4 MIL/uL (ref 4.22–5.81)
RDW: 13.9 % (ref 11.5–15.5)
WBC: 12.4 10*3/uL — ABNORMAL HIGH (ref 4.0–10.5)

## 2017-02-04 LAB — CBG MONITORING, ED: GLUCOSE-CAPILLARY: 184 mg/dL — AB (ref 65–99)

## 2017-02-04 NOTE — ED Notes (Signed)
ED Provider at bedside. 

## 2017-02-04 NOTE — ED Triage Notes (Signed)
Per EMS pt from home having generalized weakness and L ankle pain, family thinks UTI due to foul smelling and dark urine, denies injury to ankle, denies N/V/D, no pain, A+Ox4.

## 2017-02-04 NOTE — ED Provider Notes (Signed)
[facility] EMERGENCY DEPARTMENT Provider Note   CSN: [id number] Arrival date & time: 02/04/17  2301     History   Chief Complaint Chief Complaint  Patient presents with  . Weakness    HPI Howard Shepard is a 83 y.o. male.  The history is provided by the patient and a relative.  Weakness  This is a new problem. The current episode started 2 days ago. The problem has been gradually worsening. There was no focality noted. There has been no fever. Pertinent negatives include no shortness of breath and no vomiting. Associated symptoms comments: Chronic headache.   Patient with history of diabetes, history of hypertension, hard of hearing presents with generalized weakness and left ankle pain for 2 days Per daughter who lives with patient, patient has reported left ankle pain and swelling but no trauma reported. He has had difficulty walking, and she feels that he is feeling generalized weakness due to laying in bed for 2 days They also suspect he may have a UTI No other acute issues at this time  Past Medical History:  Diagnosis Date  . Diabetes mellitus without complication (HCC)   . Hypertension     Patient Active Problem List   Diagnosis Date Noted  . SHINGLES 07/16/2008  . LUMBAR RADICULOPATHY, LEFT 04/30/2008  . ABDOMINAL PAIN, LEFT LOWER QUADRANT 09/07/2007  . HYPERLIPIDEMIA 02/28/2007  . HYPERTENSION 02/28/2007  . GERD 02/28/2007  . SKIN LESIONS, MULTIPLE 02/28/2007  . DIABETES MELLITUS, TYPE II 09/28/2006  . DEPRESSION 09/28/2006  . SLEEP APNEA, OBSTRUCTIVE 09/28/2006  . CONGESTIVE HEART FAILURE 09/28/2006  . HEMORRHOIDS, INTERNAL, WITH BLEEDING 09/28/2006  . ALLERGIC RHINITIS 09/28/2006  . Peptic ulcer, unspecified site, unspecified as acute or chronic, without mention of hemorrhage, perforation, or obstruction 09/28/2006  . ARTHRITIS, RHEUMATOID 09/28/2006  . LOW BACK PAIN 09/28/2006  . URINARY INCONTINENCE 09/28/2006  . TRANSIENT  ISCHEMIC ATTACK, HX OF 09/28/2006  . COLONIC POLYPS, HX OF 09/28/2006  . Personal history of urinary calculi 09/28/2006    Past Surgical History:  Procedure Laterality Date  . LEFT AND RIGHT HEART CATHETERIZATION WITH CORONARY ANGIOGRAM N/A 07/14/2011   Procedure: LEFT AND RIGHT HEART CATHETERIZATION WITH CORONARY ANGIOGRAM;  Surgeon: Pamella Pert, MD;  Location: [facility] CATH LAB;  Service: Cardiovascular;  Laterality: N/A;       Home Medications    Prior to Admission medications   Medication Sig Start Date End Date Taking? Authorizing Provider  carvedilol (COREG) 12.5 MG tablet Take 12.5 mg by mouth 2 (two) times daily with a meal.    [provider]  Chlorpheniramine-Pseudoeph (MINTEX PO) Take 1 capsule by mouth 2 (two) times daily.    [provider]  clopidogrel (PLAVIX) 75 MG tablet Take 75 mg by mouth daily. 09/21/16   [provider]  Cyanocobalamin (VITAMIN B 12 PO) Take 1 tablet by mouth daily.    [provider]  donepezil (ARICEPT) 10 MG tablet Take 10 mg by mouth at bedtime.     [provider]  folic acid (FOLVITE) 800 MCG tablet Take 800 mcg by mouth daily.    [provider]  glipiZIDE (GLUCOTROL) 10 MG tablet Take 10 mg by mouth 2 (two) times daily before a meal.     [provider]  Insulin Degludec (TRESIBA FLEXTOUCH) 200 UNIT/ML SOPN Inject 37 Units into the skin daily.    [provider]  Magnesium 250 MG TABS Take 250 mg by mouth daily.  [provider]  methotrexate (RHEUMATREX) 2.5 MG tablet Take 7.5 mg by mouth once a week. Caution:Chemotherapy. Protect from light.    [provider]  Omega-3 Fatty Acids (FISH OIL PO) Take 1 capsule by mouth 2 (two) times daily.    [provider]  omeprazole (PRILOSEC) 20 MG capsule Take 20 mg by mouth daily.    [provider]  Prenatal Vit-Fe Fumarate-FA (MULTIVITAMIN-PRENATAL) 27-0.8 MG TABS Take 1 tablet by mouth  daily.    [provider]  saxagliptin HCl (ONGLYZA) 2.5 MG TABS tablet Take 2.5 mg by mouth daily.    [provider]  simvastatin (ZOCOR) 10 MG tablet Take 5 mg by mouth daily at 6 PM.    [provider]  Tamsulosin HCl (FLOMAX) 0.4 MG CAPS Take 0.4 mg by mouth daily after supper.    [provider]    Family History No family history on file.  Social History Social History   Tobacco Use  . Smoking status: Unknown If Ever Smoked  Substance Use Topics  . Alcohol use: No  . Drug use: No     Allergies   Patient has no known allergies.   Review of Systems Review of Systems  Constitutional: Positive for fatigue. Negative for fever.  Respiratory: Negative for shortness of breath.   Gastrointestinal: Negative for vomiting.  Musculoskeletal: Positive for arthralgias and joint swelling.  Neurological: Positive for weakness.  All other systems reviewed and are negative.    Physical Exam Updated Vital Signs BP 130/73 (BP Location: Right Arm)   Pulse 90   Temp 97.9 F (36.6 C) (Oral)   Resp (!) 24   Ht 1.702 m (5\' 7" )   Wt 79.8 kg (176 lb)   SpO2 94%   BMI 27.57 kg/m   Physical Exam CONSTITUTIONAL: Elderly, hard of hearing HEAD: Normocephalic/atraumatic EYES: EOMI/PERRL ENMT: Mucous membranes moist NECK: supple no meningeal signs SPINE/BACK:entire spine nontender CV: S1/S2 noted, no murmurs/rubs/gallops noted LUNGS: Lungs are clear to auscultation bilaterally, no apparent distress ABDOMEN: soft, nontender GU:no cva tenderness NEURO: Pt is awake/alert/appropriate, moves all extremitiesx4.  No facial droop.  No arm or leg drift EXTREMITIES: pulses normal/equal, full ROM, see photo No Lower extremity edema, no calf tenderness noted SKIN: warm, color normal PSYCH: no abnormalities of mood noted, alert and oriented to situation     ED Treatments / Results  Labs (all labs ordered are listed, but only abnormal results are  displayed) Labs Reviewed  BASIC METABOLIC PANEL - Abnormal; Notable for the following components:      Result Value   Sodium 134 (*)    CO2 19 (*)    Glucose, Bld 190 (*)    Calcium 8.8 (*)    GFR calc non Af Amer 55 (*)    All other components within normal limits  CBC - Abnormal; Notable for the following components:   WBC 12.4 (*)    All other components within normal limits  URINALYSIS, ROUTINE W REFLEX MICROSCOPIC - Abnormal; Notable for the following components:   APPearance HAZY (*)    Ketones, ur 5 (*)    Nitrite POSITIVE (*)    Bacteria, UA FEW (*)    All other components within normal limits  CBG MONITORING, ED - Abnormal; Notable for the following components:   Glucose-Capillary 184 (*)    All other components within normal limits  URINE CULTURE    EKG  EKG Interpretation  Date/Time:  Thursday February 04 2017 23:11:57 EST  Ventricular Rate:  79 PR Interval:    QRS Duration: 172 QT Interval:  466 QTC Calculation: 535 R Axis:   33 Text Interpretation:  Sinus rhythm Left bundle branch block Baseline wander in lead(s) V3 No significant change since last tracing Confirmed by Zadie Rhine ([id number]) on 02/04/2017 11:29:42 PM       Radiology Dg Chest Port 1 View  Result Date: 02/05/2017 CLINICAL DATA:  Weakness for 1 day. EXAM: PORTABLE CHEST 1 VIEW COMPARISON:  12/18/2014 FINDINGS: Shallow inspiration. No airspace consolidation. Normal pulmonary vasculature. No pleural effusions. Multiple healed rib fracture deformities bilaterally. Hilar, mediastinal and cardiac contours are unchanged. IMPRESSION: No acute findings.  Shallow inspiration. Electronically Signed   By: Ellery Plunk M.D.   On: 02/05/2017 01:39   Dg Ankle Left Port  Result Date: 02/05/2017 CLINICAL DATA:  Ankle pain for 2 days. EXAM: PORTABLE LEFT ANKLE - 2 VIEW COMPARISON:  None. FINDINGS: There is no evidence of fracture, dislocation, or joint effusion. There is no evidence of arthropathy or  other focal bone abnormality. Soft tissues are unremarkable. IMPRESSION: Negative. Electronically Signed   By: Ellery Plunk M.D.   On: 02/05/2017 01:38    Procedures Procedures (including critical care time)  Medications Ordered in ED Medications  fentaNYL (SUBLIMAZE) injection 25 mcg (not administered)  cefTRIAXone (ROCEPHIN) 1 g in dextrose 5 % 50 mL IVPB (1 g Intravenous New Bag/Given 02/05/17 0134)  HYDROcodone-acetaminophen (NORCO/VICODIN) 5-325 MG per tablet 1 tablet (1 tablet Oral Given 02/05/17 0133)     Initial Impression / Assessment and Plan / ED Course  I have reviewed the triage vital signs and the nursing notes.  Pertinent labs & imaging results that were available during my care of the patient were reviewed by me and considered in my medical decision making (see chart for details).     Suspect left ankle pain may be due to gout Also been having generalized weakness, will perform labs and imaging No focal signs of stroke at this time Patient is hard of hearing but is otherwise at his baseline in no acute distress 2:19 AM Patient found to have UTI which is likely cause of his generalized weakness He reports continued left ankle pain, I doubt septic joint, probable gout Will admit to the hospital, discussed with Dr. Toniann Fail  Final Clinical Impressions(s) / ED Diagnoses   Final diagnoses:  Pain  Weakness  Acute cystitis without hematuria    ED Discharge Orders    None       Zadie Rhine, MD 02/05/17 [phone]

## 2017-02-05 ENCOUNTER — Encounter (HOSPITAL_COMMUNITY): Payer: Self-pay | Admitting: Internal Medicine

## 2017-02-05 ENCOUNTER — Emergency Department (HOSPITAL_COMMUNITY): Payer: Medicare Other

## 2017-02-05 ENCOUNTER — Other Ambulatory Visit: Payer: Self-pay

## 2017-02-05 ENCOUNTER — Observation Stay (HOSPITAL_COMMUNITY): Payer: Medicare Other

## 2017-02-05 DIAGNOSIS — S93492A Sprain of other ligament of left ankle, initial encounter: Secondary | ICD-10-CM | POA: Diagnosis present

## 2017-02-05 DIAGNOSIS — E785 Hyperlipidemia, unspecified: Secondary | ICD-10-CM | POA: Diagnosis present

## 2017-02-05 DIAGNOSIS — I10 Essential (primary) hypertension: Secondary | ICD-10-CM | POA: Diagnosis not present

## 2017-02-05 DIAGNOSIS — Z7902 Long term (current) use of antithrombotics/antiplatelets: Secondary | ICD-10-CM | POA: Diagnosis not present

## 2017-02-05 DIAGNOSIS — R531 Weakness: Secondary | ICD-10-CM

## 2017-02-05 DIAGNOSIS — R5383 Other fatigue: Secondary | ICD-10-CM | POA: Diagnosis present

## 2017-02-05 DIAGNOSIS — I130 Hypertensive heart and chronic kidney disease with heart failure and stage 1 through stage 4 chronic kidney disease, or unspecified chronic kidney disease: Secondary | ICD-10-CM | POA: Diagnosis present

## 2017-02-05 DIAGNOSIS — N3000 Acute cystitis without hematuria: Secondary | ICD-10-CM | POA: Diagnosis not present

## 2017-02-05 DIAGNOSIS — I447 Left bundle-branch block, unspecified: Secondary | ICD-10-CM | POA: Diagnosis present

## 2017-02-05 DIAGNOSIS — M25572 Pain in left ankle and joints of left foot: Secondary | ICD-10-CM | POA: Diagnosis not present

## 2017-02-05 DIAGNOSIS — N183 Chronic kidney disease, stage 3 (moderate): Secondary | ICD-10-CM | POA: Diagnosis present

## 2017-02-05 DIAGNOSIS — R51 Headache: Secondary | ICD-10-CM | POA: Diagnosis not present

## 2017-02-05 DIAGNOSIS — Z7984 Long term (current) use of oral hypoglycemic drugs: Secondary | ICD-10-CM | POA: Diagnosis not present

## 2017-02-05 DIAGNOSIS — M1990 Unspecified osteoarthritis, unspecified site: Secondary | ICD-10-CM | POA: Diagnosis present

## 2017-02-05 DIAGNOSIS — G4733 Obstructive sleep apnea (adult) (pediatric): Secondary | ICD-10-CM | POA: Diagnosis present

## 2017-02-05 DIAGNOSIS — Z87891 Personal history of nicotine dependence: Secondary | ICD-10-CM | POA: Diagnosis not present

## 2017-02-05 DIAGNOSIS — E1122 Type 2 diabetes mellitus with diabetic chronic kidney disease: Secondary | ICD-10-CM | POA: Diagnosis present

## 2017-02-05 DIAGNOSIS — Z79899 Other long term (current) drug therapy: Secondary | ICD-10-CM | POA: Diagnosis not present

## 2017-02-05 DIAGNOSIS — I509 Heart failure, unspecified: Secondary | ICD-10-CM | POA: Diagnosis present

## 2017-02-05 DIAGNOSIS — H9190 Unspecified hearing loss, unspecified ear: Secondary | ICD-10-CM | POA: Diagnosis present

## 2017-02-05 DIAGNOSIS — E119 Type 2 diabetes mellitus without complications: Secondary | ICD-10-CM | POA: Diagnosis not present

## 2017-02-05 DIAGNOSIS — M25472 Effusion, left ankle: Secondary | ICD-10-CM | POA: Diagnosis not present

## 2017-02-05 DIAGNOSIS — M069 Rheumatoid arthritis, unspecified: Secondary | ICD-10-CM | POA: Diagnosis present

## 2017-02-05 DIAGNOSIS — X58XXXA Exposure to other specified factors, initial encounter: Secondary | ICD-10-CM | POA: Diagnosis present

## 2017-02-05 DIAGNOSIS — R5381 Other malaise: Secondary | ICD-10-CM | POA: Diagnosis present

## 2017-02-05 DIAGNOSIS — F0390 Unspecified dementia without behavioral disturbance: Secondary | ICD-10-CM | POA: Diagnosis present

## 2017-02-05 LAB — BASIC METABOLIC PANEL
Anion gap: 9 (ref 5–15)
BUN: 13 mg/dL (ref 6–20)
CALCIUM: 8.8 mg/dL — AB (ref 8.9–10.3)
CHLORIDE: 106 mmol/L (ref 101–111)
CO2: 19 mmol/L — AB (ref 22–32)
CREATININE: 1.19 mg/dL (ref 0.61–1.24)
GFR calc non Af Amer: 55 mL/min — ABNORMAL LOW (ref >60)
Glucose, Bld: 190 mg/dL — ABNORMAL HIGH (ref 65–99)
Potassium: 4.8 mmol/L (ref 3.5–5.1)
SODIUM: 134 mmol/L — AB (ref 135–145)

## 2017-02-05 LAB — URINALYSIS, ROUTINE W REFLEX MICROSCOPIC
Bilirubin Urine: NEGATIVE
Glucose, UA: NEGATIVE mg/dL
HGB URINE DIPSTICK: NEGATIVE
Ketones, ur: 5 mg/dL — AB
Leukocytes, UA: NEGATIVE
NITRITE: POSITIVE — AB
PROTEIN: NEGATIVE mg/dL
SPECIFIC GRAVITY, URINE: 1.021 (ref 1.005–1.030)
Squamous Epithelial / LPF: NONE SEEN
pH: 6 (ref 5.0–8.0)

## 2017-02-05 LAB — GLUCOSE, CAPILLARY
GLUCOSE-CAPILLARY: 201 mg/dL — AB (ref 65–99)
Glucose-Capillary: 177 mg/dL — ABNORMAL HIGH (ref 65–99)
Glucose-Capillary: 164 mg/dL — ABNORMAL HIGH (ref 65–99)
Glucose-Capillary: 184 mg/dL — ABNORMAL HIGH (ref 65–99)

## 2017-02-05 LAB — TROPONIN I
Troponin I: <0.03 ng/mL (ref <0.03)
Troponin I: <0.03 ng/mL (ref <0.03)
Troponin I: <0.03 ng/mL (ref <0.03)

## 2017-02-05 LAB — URIC ACID: Uric Acid, Serum: 5.1 mg/dL (ref 4.4–7.6)

## 2017-02-05 LAB — TSH: TSH: 1.522 u[IU]/mL (ref 0.350–4.500)

## 2017-02-05 MED ORDER — FENTANYL CITRATE (PF) 100 MCG/2ML IJ SOLN: 25.0000 ug | Freq: Once | INTRAMUSCULAR | Status: DC

## 2017-02-05 MED ORDER — ENOXAPARIN SODIUM 40 MG/0.4ML ~~LOC~~ SOLN
40.0000 mg | SUBCUTANEOUS | Status: DC
  Administered 2017-02-05 – 2017-02-06 (×2): 40 mg via SUBCUTANEOUS
  Filled 2017-02-05 (×2): qty 0.4

## 2017-02-05 MED ORDER — ACETAMINOPHEN 650 MG RE SUPP: 650.0000 mg | Freq: Four times a day (QID) | RECTAL | Status: DC | PRN

## 2017-02-05 MED ORDER — ACETAMINOPHEN 325 MG PO TABS
650.0000 mg | ORAL_TABLET | Freq: Four times a day (QID) | ORAL | Status: DC | PRN
  Administered 2017-02-05: 650 mg via ORAL
  Filled 2017-02-05: qty 2

## 2017-02-05 MED ORDER — INSULIN ASPART 100 UNIT/ML ~~LOC~~ SOLN
0.0000 [IU] | Freq: Three times a day (TID) | SUBCUTANEOUS | Status: DC
  Administered 2017-02-05 (×2): 2 [IU] via SUBCUTANEOUS
  Administered 2017-02-05: 3 [IU] via SUBCUTANEOUS
  Administered 2017-02-06: 1 [IU] via SUBCUTANEOUS
  Administered 2017-02-06 (×2): 2 [IU] via SUBCUTANEOUS

## 2017-02-05 MED ORDER — CLOPIDOGREL BISULFATE 75 MG PO TABS
75.0000 mg | ORAL_TABLET | Freq: Every day | ORAL | Status: DC
  Administered 2017-02-05 – 2017-02-07 (×3): 75 mg via ORAL
  Filled 2017-02-05 (×3): qty 1

## 2017-02-05 MED ORDER — DEXTROSE 5 % IV SOLN
1.0000 g | Freq: Once | INTRAVENOUS | Status: AC
  Administered 2017-02-05: 1 g via INTRAVENOUS
  Filled 2017-02-05: qty 10

## 2017-02-05 MED ORDER — GLIPIZIDE 10 MG PO TABS
10.0000 mg | ORAL_TABLET | Freq: Two times a day (BID) | ORAL | Status: DC
  Administered 2017-02-05 – 2017-02-07 (×5): 10 mg via ORAL
  Filled 2017-02-05 (×2): qty 1
  Filled 2017-02-05: qty 2
  Filled 2017-02-05 (×2): qty 1
  Filled 2017-02-05 (×2): qty 2
  Filled 2017-02-05: qty 1
  Filled 2017-02-05: qty 2
  Filled 2017-02-05: qty 1
  Filled 2017-02-05: qty 2

## 2017-02-05 MED ORDER — ONDANSETRON HCL 4 MG/2ML IJ SOLN: 4.0000 mg | Freq: Four times a day (QID) | INTRAMUSCULAR | Status: DC | PRN

## 2017-02-05 MED ORDER — ONDANSETRON HCL 4 MG PO TABS: 4.0000 mg | ORAL_TABLET | Freq: Four times a day (QID) | ORAL | Status: DC | PRN

## 2017-02-05 MED ORDER — MAGNESIUM OXIDE 400 (241.3 MG) MG PO TABS
200.0000 mg | ORAL_TABLET | Freq: Every day | ORAL | Status: DC
  Administered 2017-02-05 – 2017-02-07 (×3): 200 mg via ORAL
  Filled 2017-02-05 (×3): qty 1

## 2017-02-05 MED ORDER — DONEPEZIL HCL 10 MG PO TABS
10.0000 mg | ORAL_TABLET | Freq: Every day | ORAL | Status: DC
  Administered 2017-02-05 – 2017-02-06 (×2): 10 mg via ORAL
  Filled 2017-02-05 (×2): qty 1

## 2017-02-05 MED ORDER — HYDROMORPHONE HCL 1 MG/ML IJ SOLN
0.5000 mg | INTRAMUSCULAR | Status: DC | PRN
  Administered 2017-02-05 – 2017-02-07 (×5): 0.5 mg via INTRAVENOUS
  Filled 2017-02-05 (×5): qty 1

## 2017-02-05 MED ORDER — PANTOPRAZOLE SODIUM 40 MG PO TBEC
40.0000 mg | DELAYED_RELEASE_TABLET | Freq: Every day | ORAL | Status: DC
Start: 2017-02-05 — End: 2017-02-07
  Administered 2017-02-05 – 2017-02-07 (×3): 40 mg via ORAL
  Filled 2017-02-05 (×3): qty 1

## 2017-02-05 MED ORDER — METHOTREXATE 2.5 MG PO TABS: 7.5000 mg | ORAL_TABLET | ORAL | Status: DC

## 2017-02-05 MED ORDER — KETOROLAC TROMETHAMINE 15 MG/ML IJ SOLN
15.0000 mg | Freq: Once | INTRAMUSCULAR | Status: AC
  Administered 2017-02-05: 15 mg via INTRAVENOUS
  Filled 2017-02-05: qty 1

## 2017-02-05 MED ORDER — DEXTROSE 5 % IV SOLN
1.0000 g | INTRAVENOUS | Status: DC
  Administered 2017-02-05 – 2017-02-06 (×2): 1 g via INTRAVENOUS
  Filled 2017-02-05 (×2): qty 10

## 2017-02-05 MED ORDER — FOLIC ACID 1 MG PO TABS
1000.0000 ug | ORAL_TABLET | Freq: Every day | ORAL | Status: DC
  Administered 2017-02-05 – 2017-02-07 (×3): 1 mg via ORAL
  Filled 2017-02-05 (×3): qty 1

## 2017-02-05 MED ORDER — OMEGA-3-ACID ETHYL ESTERS 1 G PO CAPS
1.0000 g | ORAL_CAPSULE | Freq: Every day | ORAL | Status: DC
  Administered 2017-02-05 – 2017-02-07 (×3): 1 g via ORAL
  Filled 2017-02-05 (×3): qty 1

## 2017-02-05 MED ORDER — HYDROCODONE-ACETAMINOPHEN 5-325 MG PO TABS
1.0000 | ORAL_TABLET | Freq: Once | ORAL | Status: AC
  Administered 2017-02-05: 1 via ORAL
  Filled 2017-02-05: qty 1

## 2017-02-05 MED ORDER — CARVEDILOL 12.5 MG PO TABS
12.5000 mg | ORAL_TABLET | Freq: Two times a day (BID) | ORAL | Status: DC
  Administered 2017-02-05 – 2017-02-07 (×5): 12.5 mg via ORAL
  Filled 2017-02-05 (×5): qty 1

## 2017-02-05 MED ORDER — SIMVASTATIN 5 MG PO TABS
5.0000 mg | ORAL_TABLET | Freq: Every day | ORAL | Status: DC
  Administered 2017-02-05 – 2017-02-06 (×2): 5 mg via ORAL
  Filled 2017-02-05 (×3): qty 1

## 2017-02-05 MED ORDER — TAMSULOSIN HCL 0.4 MG PO CAPS
0.4000 mg | ORAL_CAPSULE | Freq: Every day | ORAL | Status: DC
  Administered 2017-02-05 – 2017-02-06 (×2): 0.4 mg via ORAL
  Filled 2017-02-05 (×2): qty 1

## 2017-02-05 NOTE — Progress Notes (Signed)
PROGRESS NOTE    Patient: Howard Shepard     PCP: Pearson Grippe, MD                    DOB: [birth year]            DOA: 02/04/2017 ATF:[id number]             DOS: 02/05/2017, 11:53 AM   Date of Service: the patient was seen and examined on 02/05/2017 Subjective:  This morning, awake alert oriented, complaining of pain in left foot and ankle area, and headaches. Otherwise stable denies any symptoms of dysuria.  Denies any shortness of breath or chest pain. No issues overnight  ----------------------------------------------------------------------------------------------------------------------  Brief Narrative:  Howard Shepard is a 83 y.o. male with history of diabetes mellitus type 2, hypertension, hyperlipidemia, possible CHF presents t because of increasing weakness.  Patient states over the last 2 days he has been having increasing weakness and fatigue, left ankle pain. Denies any chest pain shortness of breath nausea vomiting diarrhea.  Patient has been having increasing left ankle pain denies any trauma or fall.  Patient's family is not sure if he had a fall not because patient also has dementia.  Patient lives alone.  UA positive for UTI, EKG showing left bundle branch block, x-ray and MRI of left ankle reviewed negative for any fractures chronic changes and chronic ligament tear.  Assessment & Plan:  Generalized weakness -multifactorial, treated by urinary tract infection, left ankle pain, We will continue current IV antibiotics of Rocephin, PT/OT evaluation of left ankle and generalized weakness Will follow with cultures accordingly  Acute cystitis -patient is currently symptomatically and will follow with cultures, continue current antibiotics of Rocephin  Left ankle pain with mild erythema -x-rays do not show any obvious effusion.  However left ankle revealed soft tissue edema, osteoarthritic changes, chronic ligament tear, negative for any acute fractures or  abnormalities Uric acid was normal, gout was ruled out, We will continue analgesics, consulate PT Ankle immobilizer Due to PRN analgesics  Hypertension - stable cont. Coreg. Hyperlipidemia cont.  Statins. Diabetes mellitus on Glucotrol, check his blood sugar q. before meals at bedtime with sliding scale insulin coverage  History of CHF per chronic medical record, need for any edema or shortness of breath History of rheumatoid arthritis on methotrexate.   DVT prophylaxis: Lovenox. Code Status: Full code. Family Communication: Patient's daughter. Disposition Plan: To be determined, per PT/OT assessment, patient lives alone Consults called: Physical therapy. Admission status: Observation.   Family Communication:  The above findings and plan of care has been discussed with patient and he expressed understanding and agreement of above.  Consultants:   Sharin Mons.   Procedures:  none Antimicrobials:  Anti-infectives (From admission, onward)   Start     Dose/Rate Route Frequency Ordered Stop   02/05/17 2200  cefTRIAXone (ROCEPHIN) 1 g in dextrose 5 % 50 mL IVPB     1 g 100 mL/hr over 30 Minutes Intravenous Every 24 hours 02/05/17 0243     02/05/17 0100  cefTRIAXone (ROCEPHIN) 1 g in dextrose 5 % 50 mL IVPB     1 g 100 mL/hr over 30 Minutes Intravenous  Once 02/05/17 0055 02/05/17 0249      Objective: Vitals:   02/05/17 0130 02/05/17 0230 02/05/17 0355 02/05/17 0905  BP: (!) 131/95 136/82 133/62 (!) 151/77  Pulse: 78 76 82 85  Resp: (!) 21 19 20 18   Temp:   97.6 F (36.4 C)  TempSrc:   Oral   SpO2: 94% 93% 94% 93%  Weight:   75.1 kg (165 lb 9.1 oz)   Height:   5\' 7"  (1.702 m)     Intake/Output Summary (Last 24 hours) at 02/05/2017 1153 Last data filed at 02/05/2017 1113 Gross per 24 hour  Intake 170 ml  Output 100 ml  Net 70 ml   Filed Weights   02/04/17 2309 02/05/17 0355  Weight: 79.8 kg (176 lb) 75.1 kg (165 lb 9.1 oz)    Examination:  General exam:  Appears calm and comfortable  Respiratory system: Clear to auscultation. Respiratory effort normal. Cardiovascular system: S1 & S2 heard, RRR. No JVD, murmurs, rubs, gallops or clicks. No pedal edema. Gastrointestinal system: Abdomen is nondistended, soft and nontender. No organomegaly or masses felt. Normal bowel sounds heard. Central nervous system: Alert and oriented. No focal neurological deficits. Extremities: Symmetric 5 x 5 power.,  With left ankle edema, warm to touch with mild erythema range of motion limited secondary to pain Skin: No rashes, lesions or ulcers Psychiatry: Judgement and insight appear normal. Mood & affect appropriate.   Data Reviewed: I have personally reviewed following labs and imaging studies  CBC: Recent Labs  Lab 02/04/17 2311  WBC 12.4*  HGB 14.3  HCT 41.1  MCV 93.4  PLT 240   Basic Metabolic Panel: Recent Labs  Lab 02/04/17 2311  NA 134*  K 4.8  CL 106  CO2 19*  GLUCOSE 190*  BUN 13  CREATININE 1.19  CALCIUM 8.8*   GFR: Estimated Creatinine Clearance: 45.5 mL/min (by C-G formula based on SCr of 1.19 mg/dL). Liver Function Tests: No results for input(s): AST, ALT, ALKPHOS, BILITOT, PROT, ALBUMIN in the last 168 hours. No results for input(s): LIPASE, AMYLASE in the last 168 hours. No results for input(s): AMMONIA in the last 168 hours. Coagulation Profile: No results for input(s): INR, PROTIME in the last 168 hours. Cardiac Enzymes: Recent Labs  Lab 02/05/17 0342 02/05/17 0804  TROPONINI <0.03 <0.03   BNP (last 3 results) No results for input(s): PROBNP in the last 8760 hours. HbA1C: No results for input(s): HGBA1C in the last 72 hours. CBG: Recent Labs  Lab 02/04/17 2318 02/05/17 0805 02/05/17 1128  GLUCAP 184* 201* 177*   Lipid Profile: No results for input(s): CHOL, HDL, LDLCALC, TRIG, CHOLHDL, LDLDIRECT in the last 72 hours. Thyroid Function Tests: Recent Labs    02/05/17 0342  TSH 1.522   Anemia Panel: No  results for input(s): VITAMINB12, FOLATE, FERRITIN, TIBC, IRON, RETICCTPCT in the last 72 hours. Sepsis Labs: No results for input(s): PROCALCITON, LATICACIDVEN in the last 168 hours.  No results found for this or any previous visit (from the past 240 hour(s)).     Radiology Studies: Mr Ankle Left Wo Contrast  Result Date: 02/05/2017 CLINICAL DATA:  Ankle pain. Erythema at the lateral aspect of the ankle. EXAM: MRI OF THE LEFT ANKLE WITHOUT CONTRAST TECHNIQUE: Multiplanar, multisequence MR imaging of the ankle was performed. No intravenous contrast was administered. COMPARISON:  Radiographs dated 02/05/2017 FINDINGS: TENDONS Peroneal: Normal. Posteromedial: Tenosynovitis involving the flexor hallucis longus tendon and the posterior tibialis tendon. Anterior: Normal. Achilles: Minimal degenerative changes of the distal Achilles tendon. No acute inflammation. Plantar Fascia: Normal. LIGAMENTS Lateral: Anterior talofibular ligament is not identified and is probably chronically torn. Posterior talofibular ligament and calcaneofibular ligament are intact. Medial: Normal. CARTILAGE Ankle Joint: Small nonspecific ankle effusion. Subtalar Joints/Sinus Tarsi: No joint effusion or chondral defect. Intact spring ligament.  Bones: No significant abnormality. Other: Slight nonspecific subcutaneous edema adjacent to the distal fibula and extending below the tip of the lateral malleolus. IMPRESSION: 1. No fractures or dislocation. 2. Medial tenosynovitis. 3. Probable chronic complete tear of the anterior talofibular ligament. 4. Small nonspecific ankle joint effusion. 5. Slight nonspecific subcutaneous edema at the lateral aspect of the ankle. Electronically Signed   By: Francene Boyers M.D.   On: 02/05/2017 11:04   Dg Chest Port 1 View  Result Date: 02/05/2017 CLINICAL DATA:  Weakness for 1 day. EXAM: PORTABLE CHEST 1 VIEW COMPARISON:  12/18/2014 FINDINGS: Shallow inspiration. No airspace consolidation. Normal  pulmonary vasculature. No pleural effusions. Multiple healed rib fracture deformities bilaterally. Hilar, mediastinal and cardiac contours are unchanged. IMPRESSION: No acute findings.  Shallow inspiration. Electronically Signed   By: Ellery Plunk M.D.   On: 02/05/2017 01:39   Dg Ankle Left Port  Result Date: 02/05/2017 CLINICAL DATA:  Ankle pain for 2 days. EXAM: PORTABLE LEFT ANKLE - 2 VIEW COMPARISON:  None. FINDINGS: There is no evidence of fracture, dislocation, or joint effusion. There is no evidence of arthropathy or other focal bone abnormality. Soft tissues are unremarkable. IMPRESSION: Negative. Electronically Signed   By: Ellery Plunk M.D.   On: 02/05/2017 01:38    Scheduled Meds: . carvedilol  12.5 mg Oral BID WC  . clopidogrel  75 mg Oral Daily  . donepezil  10 mg Oral QHS  . enoxaparin (LOVENOX) injection  40 mg Subcutaneous Q24H  . folic acid  1,000 mcg Oral Daily  . glipiZIDE  10 mg Oral BID AC  . insulin aspart  0-9 Units Subcutaneous TID WC  . magnesium oxide  200 mg Oral Daily  . [START ON 02/11/2017] methotrexate  7.5 mg Oral 2 times per day on Thu  . omega-3 acid ethyl esters  1 g Oral Daily  . pantoprazole  40 mg Oral Daily  . simvastatin  5 mg Oral q1800  . tamsulosin  0.4 mg Oral QPC supper   Continuous Infusions: . cefTRIAXone (ROCEPHIN)  IV       LOS: 0 days    Time spent: >25 minutes   Kendell Bane, MD [facility] Pager [phone]  If 7PM-7AM, please contact night-coverage [email or web address] Password [email or web address] 02/05/2017, 11:53 AM

## 2017-02-05 NOTE — Evaluation (Signed)
Physical Therapy Evaluation Patient Details Name: Howard Shepard MRN: [id number] DOB: Mar 20, 1933 Today's Date: 02/05/2017   History of Present Illness  Caulin Begley Welkeris a 83 y.o.malewithhistory of diabetes mellitus type 2, hypertension, hyperlipidemia, possible CHF presents t because of increasing weakness. Patient states over the last 2 days he has been having increasing weakness and fatigue, left ankle pain.Denies any chest pain shortness of breath nausea vomiting diarrhea. Patient has been having increasing left ankle pain denies any trauma or fall. UA positive for UTI, EKG showing left bundle branch block, x-ray and MRI of left ankle reviewed negative for any fractures chronic changes and chronic ligament tear.   Clinical Impression  Pt admitted with above diagnosis. Pt currently with functional limitations due to the deficits listed below (see PT Problem List). Pt was able to squat pivot to recliner with +2 min assist. Pt limited by left ankle pain.  May need SNF to return to prior level of function.  Will follow acutely.   Pt will benefit from skilled PT to increase their independence and safety with mobility to allow discharge to the venue listed below.      Follow Up Recommendations SNF;Supervision/Assistance - 24 hour    Equipment Recommendations  Other (comment)(TBA)    Recommendations for Other Services       Precautions / Restrictions Precautions Precautions: Fall Restrictions Weight Bearing Restrictions: No      Mobility  Bed Mobility Overal bed mobility: Independent                Transfers Overall transfer level: Needs assistance Equipment used: 2 person hand held assist Transfers: Sit to/from [employer] Sit to Stand: Min assist;+2 physical assistance   Squat pivot transfers: Min assist;+2 physical assistance     General transfer comment: Pt states his left ankle still hurts and did not want to use the RW.  Pt was able to  perform a squat pivot to recliner with min assist only.    Ambulation/Gait                Stairs            Wheelchair Mobility    Modified Rankin (Stroke Patients Only)       Balance Overall balance assessment: Needs assistance Sitting-balance support: No upper extremity supported;Feet supported Sitting balance-Leahy Scale: Good     Standing balance support: Bilateral upper extremity supported;During functional activity Standing balance-Leahy Scale: Poor Standing balance comment: Pt needed UE support for balance and could not stand fully upright due to left ankle pain                             Pertinent Vitals/Pain Pain Assessment: Faces Faces Pain Scale: Hurts whole lot Pain Location: low back and hips Pain Descriptors / Indicators: Aching;Discomfort Pain Intervention(s): Limited activity within patient's tolerance;Monitored during session;Repositioned;Premedicated before session  VSS  Home Living Family/patient expects to be discharged to:: Private residence Living Arrangements: Children Available Help at Discharge: Family;Available 24 hours/day Type of Home: House Home Access: Stairs to enter Entrance Stairs-Rails: None Entrance Stairs-Number of Steps: 1 Home Layout: Two level;Laundry or work area in basement;Able to live on main level with bedroom/bathroom Home Equipment: Environmental consultant - 4 wheels;Shower seat;Grab bars - tub/shower;Wheelchair - manual      Prior Function Level of Independence: Needs assistance   Gait / Transfers Assistance Needed: used rollator in home with min guard assist  ADL's / [employer]  Needed: B/D self with set up  Comments: Information obtained from pt      Hand Dominance   Dominant Hand: Right    Extremity/Trunk Assessment   Upper Extremity Assessment Upper Extremity Assessment: Defer to OT evaluation    Lower Extremity Assessment Lower Extremity Assessment: Generalized weakness    Cervical  / Trunk Assessment Cervical / Trunk Assessment: Kyphotic  Communication   Communication: HOH  Cognition Arousal/Alertness: Awake/alert Behavior During Therapy: WFL for tasks assessed/performed Overall Cognitive Status: Impaired/Different from baseline Area of Impairment: Memory;Problem solving                     Memory: Decreased short-term memory       Problem Solving: Difficulty sequencing;Requires verbal cues        General Comments      Exercises General Exercises - Lower Extremity Ankle Circles/Pumps: AROM;Both;5 reps;Supine Long Arc Quad: AROM;Both;10 reps;Seated   Assessment/Plan    PT Assessment Patient needs continued PT services  PT Problem List Decreased activity tolerance;Decreased balance;Decreased mobility;Decreased knowledge of use of DME;Decreased safety awareness;Decreased knowledge of precautions;Pain       PT Treatment Interventions DME instruction;Gait training;Stair training;Functional mobility training;Therapeutic activities;Therapeutic exercise;Balance training;Patient/family education    PT Goals (Current goals can be found in the Care Plan section)  Acute Rehab PT Goals Patient Stated Goal: to go home PT Goal Formulation: With patient Time For Goal Achievement: 02/19/17 Potential to Achieve Goals: Good    Frequency Min 3X/week   Barriers to discharge        Co-evaluation               AM-PAC PT "6 Clicks" Daily Activity  Outcome Measure Difficulty turning over in bed (including adjusting bedclothes, sheets and blankets)?: None Difficulty moving from lying on back to sitting on the side of the bed? : None Difficulty sitting down on and standing up from a chair with arms (e.g., wheelchair, bedside commode, etc,.)?: A Little Help needed moving to and from a bed to chair (including a wheelchair)?: A Little Help needed walking in hospital room?: Total Help needed climbing 3-5 steps with a railing? : Total 6 Click Score:  16    End of Session Equipment Utilized During Treatment: Gait belt Activity Tolerance: Patient limited by fatigue Patient left: in chair;with call bell/phone within reach;with chair alarm set;with nursing/sitter in room Nurse Communication: Mobility status PT Visit Diagnosis: Unsteadiness on feet (R26.81);Muscle weakness (generalized) (M62.81)    Time: 1191-4782 PT Time Calculation (min) (ACUTE ONLY): 19 min   Charges:   PT Evaluation $PT Eval Moderate Complexity: 1 Mod     PT G Codes:   PT G-Codes **NOT FOR INPATIENT CLASS** Functional Assessment Tool Used: AM-PAC 6 Clicks Basic Mobility Functional Limitation: Mobility: Walking and moving around Mobility: Walking and Moving Around Current Status (N5621): At least 20 percent but less than 40 percent impaired, limited or restricted Mobility: Walking and Moving Around Goal Status [phone]): At least 1 percent but less than 20 percent impaired, limited or restricted    [facility] [phone] (pager)   Berline Lopes 02/05/2017, 2:20 PM

## 2017-02-05 NOTE — Progress Notes (Signed)
Family at bedside requesting results of test, provider paged.

## 2017-02-05 NOTE — H&P (Addendum)
History and Physical    Howard Shepard ZOX:[id number] DOB: Dec 24, 1933 DOA: 02/04/2017  PCP: Pearson Grippe, MD  Patient coming from: Home.  Chief Complaint: Weakness.  HPI: Howard Shepard is a 83 y.o. male with history of diabetes mellitus type 2, hypertension, hyperlipidemia, possible CHF presents to the ER because of increasing weakness.  Patient states over the last 2 days he has been having increasing weakness and fatigue.  Denies any chest pain shortness of breath nausea vomiting diarrhea.  Patient has been having increasing left ankle pain denies any trauma or fall.  Patient's family is not sure if he had a fall not because patient also has dementia.  Patient lives alone.  ED Course: In the ER on exam patient has mild erythema around the left ankle lateral aspect.  No obvious effusion on exam.  UA shows features consistent with UTI.  EKG shows LBBB which is chronic.  Patient is being admitted for further observation for weakness UTI and left ankle pain.  Review of Systems: As per HPI, rest all negative.   Past Medical History:  Diagnosis Date  . Diabetes mellitus without complication (HCC)   . Hypertension     Past Surgical History:  Procedure Laterality Date  . LEFT AND RIGHT HEART CATHETERIZATION WITH CORONARY ANGIOGRAM N/A 07/14/2011   Procedure: LEFT AND RIGHT HEART CATHETERIZATION WITH CORONARY ANGIOGRAM;  Surgeon: Pamella Pert, MD;  Location: [facility] CATH LAB;  Service: Cardiovascular;  Laterality: N/A;     has an unknown smoking status. he has never used smokeless tobacco. He reports that he does not drink alcohol or use drugs.  No Known Allergies  Family History  Problem Relation Age of Onset  . Hypertension Other     Prior to Admission medications   Medication Sig Start Date End Date Taking? Authorizing Provider  carvedilol (COREG) 12.5 MG tablet Take 12.5 mg by mouth 2 (two) times daily with a meal.   Yes [provider]  clopidogrel (PLAVIX) 75 MG  tablet Take 75 mg by mouth daily. 09/21/16  Yes [provider]  Cyanocobalamin (VITAMIN B 12 PO) Take 1 tablet by mouth daily.   Yes [provider]  donepezil (ARICEPT) 10 MG tablet Take 10 mg by mouth at bedtime.    Yes [provider]  folic acid (FOLVITE) 800 MCG tablet Take 800 mcg by mouth daily.   Yes [provider]  glipiZIDE (GLUCOTROL) 10 MG tablet Take 10 mg by mouth 2 (two) times daily before a meal.    Yes [provider]  Magnesium 250 MG TABS Take 250 mg by mouth daily.   Yes [provider]  methotrexate (RHEUMATREX) 2.5 MG tablet Take 7.5 mg by mouth See admin instructions. Take 7.5mg  by mouth Thursday morning and 7.5mg  by mouth Thursday evening   Yes [provider]  Omega-3 Fatty Acids (FISH OIL PO) Take 1 capsule by mouth 2 (two) times daily.   Yes [provider]  omeprazole (PRILOSEC) 20 MG capsule Take 20 mg by mouth daily.   Yes [provider]  simvastatin (ZOCOR) 10 MG tablet Take 5 mg by mouth daily at 6 PM.   Yes [provider]  Tamsulosin HCl (FLOMAX) 0.4 MG CAPS Take 0.4 mg by mouth daily after supper.   Yes [provider]    Physical Exam: Vitals:   02/04/17 2330 02/04/17 2345 02/05/17 0000 02/05/17 0130  BP: 137/80 (!) 141/80 (!) 151/85 (!) 131/95  Pulse: 80 76  77 78  Resp: 20 (!) 24 (!) 23 (!) 21  Temp:      TempSrc:      SpO2: 94% 94% 92% 94%  Weight:      Height:          Constitutional: Moderately built and nourished. Vitals:   02/04/17 2330 02/04/17 2345 02/05/17 0000 02/05/17 0130  BP: 137/80 (!) 141/80 (!) 151/85 (!) 131/95  Pulse: 80 76 77 78  Resp: 20 (!) 24 (!) 23 (!) 21  Temp:      TempSrc:      SpO2: 94% 94% 92% 94%  Weight:      Height:       Eyes: Anicteric no pallor. ENMT: No discharge from the ears eyes nose or mouth. Neck: No mass felt.  No JVD appreciated. Respiratory: No rhonchi or crepitations. Cardiovascular: S1-S2  heard. Abdomen: Soft nontender bowel sounds present. Musculoskeletal: No edema.  But pain on moving left ankle mild erythema. Skin: Mild erythema of the left ankle Neurologic: Alert awake oriented to place and person.  Moves all extremities. Psychiatric: Has mild dementia.   Labs on Admission: I have personally reviewed following labs and imaging studies  CBC: Recent Labs  Lab 02/04/17 2311  WBC 12.4*  HGB 14.3  HCT 41.1  MCV 93.4  PLT 240   Basic Metabolic Panel: Recent Labs  Lab 02/04/17 2311  NA 134*  K 4.8  CL 106  CO2 19*  GLUCOSE 190*  BUN 13  CREATININE 1.19  CALCIUM 8.8*   GFR: Estimated Creatinine Clearance: 49.3 mL/min (by C-G formula based on SCr of 1.19 mg/dL). Liver Function Tests: No results for input(s): AST, ALT, ALKPHOS, BILITOT, PROT, ALBUMIN in the last 168 hours. No results for input(s): LIPASE, AMYLASE in the last 168 hours. No results for input(s): AMMONIA in the last 168 hours. Coagulation Profile: No results for input(s): INR, PROTIME in the last 168 hours. Cardiac Enzymes: No results for input(s): CKTOTAL, CKMB, CKMBINDEX, TROPONINI in the last 168 hours. BNP (last 3 results) No results for input(s): PROBNP in the last 8760 hours. HbA1C: No results for input(s): HGBA1C in the last 72 hours. CBG: Recent Labs  Lab 02/04/17 2318  GLUCAP 184*   Lipid Profile: No results for input(s): CHOL, HDL, LDLCALC, TRIG, CHOLHDL, LDLDIRECT in the last 72 hours. Thyroid Function Tests: No results for input(s): TSH, T4TOTAL, FREET4, T3FREE, THYROIDAB in the last 72 hours. Anemia Panel: No results for input(s): VITAMINB12, FOLATE, FERRITIN, TIBC, IRON, RETICCTPCT in the last 72 hours. Urine analysis:    Component Value Date/Time   COLORURINE YELLOW 02/04/2017 2311   APPEARANCEUR HAZY (A) 02/04/2017 2311   LABSPEC 1.021 02/04/2017 2311   PHURINE 6.0 02/04/2017 2311   GLUCOSEU NEGATIVE 02/04/2017 2311   GLUCOSEU NEGATIVE 04/30/2008 1156   HGBUR  NEGATIVE 02/04/2017 2311   BILIRUBINUR NEGATIVE 02/04/2017 2311   KETONESUR 5 (A) 02/04/2017 2311   PROTEINUR NEGATIVE 02/04/2017 2311   UROBILINOGEN 0.2 12/11/2008 1647   NITRITE POSITIVE (A) 02/04/2017 2311   LEUKOCYTESUR NEGATIVE 02/04/2017 2311   Sepsis Labs: @LABRCNTIP (procalcitonin:4,lacticidven:4) )No results found for this or any previous visit (from the past 240 hour(s)).   Radiological Exams on Admission: Dg Chest Port 1 View  Result Date: 02/05/2017 CLINICAL DATA:  Weakness for 1 day. EXAM: PORTABLE CHEST 1 VIEW COMPARISON:  12/18/2014 FINDINGS: Shallow inspiration. No airspace consolidation. Normal pulmonary vasculature. No pleural effusions. Multiple healed rib fracture deformities bilaterally. Hilar, mediastinal and cardiac contours are unchanged. IMPRESSION: No  acute findings.  Shallow inspiration. Electronically Signed   By: Ellery Plunk M.D.   On: 02/05/2017 01:39   Dg Ankle Left Port  Result Date: 02/05/2017 CLINICAL DATA:  Ankle pain for 2 days. EXAM: PORTABLE LEFT ANKLE - 2 VIEW COMPARISON:  None. FINDINGS: There is no evidence of fracture, dislocation, or joint effusion. There is no evidence of arthropathy or other focal bone abnormality. Soft tissues are unremarkable. IMPRESSION: Negative. Electronically Signed   By: Ellery Plunk M.D.   On: 02/05/2017 01:38    EKG: Independently reviewed.  Sinus rhythm with LBBB.  Assessment/Plan Principal Problem:   Weakness Active Problems:   SLEEP APNEA, OBSTRUCTIVE   Essential hypertension   Congestive heart failure (HCC)   Diabetes mellitus type 2 in nonobese (HCC)    1. Generalized weakness -could be from deconditioning and UTI.  Patient also has left ankle pain.  Will keep patient on ceftriaxone get physical therapy consult check cardiac markers. 2. UTI on ceftriaxone follow urine cultures. 3. Left ankle pain with mild erythema -x-rays do not show any obvious effusion.  I have ordered MRI of the left ankle.   Suspect patient may be having gout.  I have ordered 1 dose of Toradol.  Check uric acid levels. 4. Hypertension on Coreg. 5. Hyperlipidemia on statins. 6. Diabetes mellitus on Glucotrol and I have placed patient on sliding scale coverage. 7. History of CHF per the chart no obvious signs of fluid overload. 8. History of rheumatoid arthritis on methotrexate.   DVT prophylaxis: Lovenox. Code Status: Full code. Family Communication: Patient's daughter. Disposition Plan: To be determined. Consults called: Physical therapy. Admission status: Observation.   Eduard Clos MD [facility] Pager [phone].  If 7PM-7AM, please contact night-coverage [email or web address] Password [email or web address]  02/05/2017, 2:41 AM

## 2017-02-05 NOTE — ED Notes (Signed)
Portable xray at bedside.

## 2017-02-05 NOTE — Progress Notes (Signed)
Pharmacy Antibiotic Note  Howard Shepard is a 83 y.o. male admitted on 02/04/2017 with UTI.  Pharmacy has been consulted for Rocephin dosing.  Plan: Rocephin 1g IV Q24H.  Pharmacy will sign off.  Height: 5\' 7"  (170.2 cm) Weight: 176 lb (79.8 kg) IBW/kg (Calculated) : 66.1  Temp (24hrs), Avg:97.9 F (36.6 C), Min:97.9 F (36.6 C), Max:97.9 F (36.6 C)  Recent Labs  Lab 02/04/17 2311  WBC 12.4*  CREATININE 1.19    Estimated Creatinine Clearance: 49.3 mL/min (by C-G formula based on SCr of 1.19 mg/dL).    No Known Allergies   Thank you for allowing pharmacy to be a part of this patient's care.  02/06/17, PharmD, BCPS  02/05/2017 2:42 AM

## 2017-02-05 NOTE — Consult Note (Signed)
Patient ID: Howard Shepard MRN: [id number] DOB/AGE: 1933-08-13 83 y.o.  Admit date: 02/04/2017  Admission Diagnoses:  Principal Problem:   Weakness Active Problems:   SLEEP APNEA, OBSTRUCTIVE   Essential hypertension   Congestive heart failure (HCC)   Diabetes mellitus type 2 in nonobese [facility])   HPI: Pleasant 83 year old gentleman with a  Chronic diagnosis of Dementia.  He presented to the ER yesterday with weakness.  He was admitted and treated for UTI.  The pt has had pain in his ankle for several days.  The family does not recall a trama.  They have been applying heat.  The pt reports feeling much better today.   Past Medical History: Past Medical History:  Diagnosis Date  . Diabetes mellitus without complication (HCC)   . Hypertension     Surgical History: Past Surgical History:  Procedure Laterality Date  . LEFT AND RIGHT HEART CATHETERIZATION WITH CORONARY ANGIOGRAM N/A 07/14/2011   Procedure: LEFT AND RIGHT HEART CATHETERIZATION WITH CORONARY ANGIOGRAM;  Surgeon: Pamella Pert, MD;  Location: [facility] CATH LAB;  Service: Cardiovascular;  Laterality: N/A;    Family History: Family History  Problem Relation Age of Onset  . Hypertension Other     Social History: Social History   Socioeconomic History  . Marital status: Married    Spouse name: Not on file  . Number of children: Not on file  . Years of education: Not on file  . Highest education level: Not on file  Social Needs  . Financial resource strain: Not on file  . Food insecurity - worry: Not on file  . Food insecurity - inability: Not on file  . Transportation needs - medical: Not on file  . Transportation needs - non-medical: Not on file  Occupational History  . Not on file  Tobacco Use  . Smoking status: Former Smoker    Types: Cigars    Last attempt to quit: 01/23/1997    Years since quitting: 20.0  . Smokeless tobacco: Never Used  Substance and Sexual Activity  . Alcohol use: No  . Drug  use: No  . Sexual activity: No  Other Topics Concern  . Not on file  Social History Narrative  . Not on file    Allergies: Patient has no known allergies.  Medications: I have reviewed the patient's current medications.  Vital Signs: Patient Vitals for the past 24 hrs:  BP Temp Temp src Pulse Resp SpO2 Height Weight  02/05/17 1344 130/79 98.8 F (37.1 C) Oral 85 20 100 % - -  02/05/17 0905 (!) 151/77 - - 85 18 93 % - -  02/05/17 0355 133/62 97.6 F (36.4 C) Oral 82 20 94 % 5\' 7"  (1.702 m) 75.1 kg (165 lb 9.1 oz)  02/05/17 0230 136/82 - - 76 19 93 % - -  02/05/17 0130 (!) 131/95 - - 78 (!) 21 94 % - -  02/05/17 0000 (!) 151/85 - - 77 (!) 23 92 % - -  02/04/17 2345 (!) 141/80 - - 76 (!) 24 94 % - -  02/04/17 2330 137/80 - - 80 20 94 % - -  02/04/17 2324 130/73 97.9 F (36.6 C) Oral 90 (!) 24 94 % - -  02/04/17 2309 - - - - - - 5\' 7"  (1.702 m) 79.8 kg (176 lb)  02/04/17 2302 - - - - - 94 % - -    Radiology: Mr Ankle Left Wo Contrast  Result Date:  02/05/2017 CLINICAL DATA:  Ankle pain. Erythema at the lateral aspect of the ankle. EXAM: MRI OF THE LEFT ANKLE WITHOUT CONTRAST TECHNIQUE: Multiplanar, multisequence MR imaging of the ankle was performed. No intravenous contrast was administered. COMPARISON:  Radiographs dated 02/05/2017 FINDINGS: TENDONS Peroneal: Normal. Posteromedial: Tenosynovitis involving the flexor hallucis longus tendon and the posterior tibialis tendon. Anterior: Normal. Achilles: Minimal degenerative changes of the distal Achilles tendon. No acute inflammation. Plantar Fascia: Normal. LIGAMENTS Lateral: Anterior talofibular ligament is not identified and is probably chronically torn. Posterior talofibular ligament and calcaneofibular ligament are intact. Medial: Normal. CARTILAGE Ankle Joint: Small nonspecific ankle effusion. Subtalar Joints/Sinus Tarsi: No joint effusion or chondral defect. Intact spring ligament. Bones: No significant abnormality. Other: Slight  nonspecific subcutaneous edema adjacent to the distal fibula and extending below the tip of the lateral malleolus. IMPRESSION: 1. No fractures or dislocation. 2. Medial tenosynovitis. 3. Probable chronic complete tear of the anterior talofibular ligament. 4. Small nonspecific ankle joint effusion. 5. Slight nonspecific subcutaneous edema at the lateral aspect of the ankle. Electronically Signed   By: Francene Boyers M.D.   On: 02/05/2017 11:04   Dg Chest Port 1 View  Result Date: 02/05/2017 CLINICAL DATA:  Weakness for 1 day. EXAM: PORTABLE CHEST 1 VIEW COMPARISON:  12/18/2014 FINDINGS: Shallow inspiration. No airspace consolidation. Normal pulmonary vasculature. No pleural effusions. Multiple healed rib fracture deformities bilaterally. Hilar, mediastinal and cardiac contours are unchanged. IMPRESSION: No acute findings.  Shallow inspiration. Electronically Signed   By: Ellery Plunk M.D.   On: 02/05/2017 01:39   Dg Ankle Left Port  Result Date: 02/05/2017 CLINICAL DATA:  Ankle pain for 2 days. EXAM: PORTABLE LEFT ANKLE - 2 VIEW COMPARISON:  None. FINDINGS: There is no evidence of fracture, dislocation, or joint effusion. There is no evidence of arthropathy or other focal bone abnormality. Soft tissues are unremarkable. IMPRESSION: Negative. Electronically Signed   By: Ellery Plunk M.D.   On: 02/05/2017 01:38    Labs: Recent Labs    02/04/17 2311  WBC 12.4*  RBC 4.40  HCT 41.1  PLT 240   Recent Labs    02/04/17 2311  NA 134*  K 4.8  CL 106  CO2 19*  BUN 13  CREATININE 1.19  GLUCOSE 190*  CALCIUM 8.8*   No results for input(s): LABPT, INR in the last 72 hours.  Review of Systems: ROS  Physical Exam: Neurologically intact ABD soft Sensation intact distally Dorsiflexion/Plantar flexion intact Compartment soft  Edema noted of the lateral ankle Good ROM  Assessment and Plan: Reviewed the MRI Consulted with Dr. Shon Baton Recommend a ASO for support when  ambulating Pt may f/u with Dr. Penni Bombard after discharge  [facility] for Venita Lick, MD [facility] [phone]

## 2017-02-05 NOTE — Progress Notes (Signed)
Pt admitted to [facility] from ED. Pt is alert and orientedx4, per family pt is forgetful and has hx of dementia. Pt vss. Pt placed on tele in NSR with BBB. Skin is intact, coccyx is red but blanchable. Skin assessment completed with Lissa Hoard RN. Pt and family oriented to unit and equipment. WCTM

## 2017-02-06 DIAGNOSIS — R531 Weakness: Secondary | ICD-10-CM

## 2017-02-06 DIAGNOSIS — E119 Type 2 diabetes mellitus without complications: Secondary | ICD-10-CM

## 2017-02-06 DIAGNOSIS — N3000 Acute cystitis without hematuria: Secondary | ICD-10-CM

## 2017-02-06 DIAGNOSIS — M25572 Pain in left ankle and joints of left foot: Secondary | ICD-10-CM

## 2017-02-06 LAB — BASIC METABOLIC PANEL
Anion gap: 7 (ref 5–15)
BUN: 22 mg/dL — AB (ref 6–20)
CALCIUM: 8.6 mg/dL — AB (ref 8.9–10.3)
CO2: 24 mmol/L (ref 22–32)
CREATININE: 1.61 mg/dL — AB (ref 0.61–1.24)
Chloride: 105 mmol/L (ref 101–111)
GFR calc Af Amer: 45 mL/min — ABNORMAL LOW (ref >60)
GFR calc non Af Amer: 38 mL/min — ABNORMAL LOW (ref >60)
GLUCOSE: 178 mg/dL — AB (ref 65–99)
Potassium: 4.3 mmol/L (ref 3.5–5.1)
SODIUM: 136 mmol/L (ref 135–145)

## 2017-02-06 LAB — CBC
HCT: 38.7 % — ABNORMAL LOW (ref 39.0–52.0)
HEMOGLOBIN: 13.1 g/dL (ref 13.0–17.0)
MCH: 31.9 pg (ref 26.0–34.0)
MCHC: 33.9 g/dL (ref 30.0–36.0)
MCV: 94.2 fL (ref 78.0–100.0)
PLATELETS: 239 10*3/uL (ref 150–400)
RBC: 4.11 MIL/uL — AB (ref 4.22–5.81)
RDW: 14.1 % (ref 11.5–15.5)
WBC: 9 10*3/uL (ref 4.0–10.5)

## 2017-02-06 LAB — URINE CULTURE

## 2017-02-06 LAB — GLUCOSE, CAPILLARY
GLUCOSE-CAPILLARY: 167 mg/dL — AB (ref 65–99)
Glucose-Capillary: 137 mg/dL — ABNORMAL HIGH (ref 65–99)
Glucose-Capillary: 176 mg/dL — ABNORMAL HIGH (ref 65–99)
Glucose-Capillary: 134 mg/dL — ABNORMAL HIGH (ref 65–99)

## 2017-02-06 MED ORDER — OXYCODONE-ACETAMINOPHEN 5-325 MG PO TABS
1.0000 | ORAL_TABLET | ORAL | Status: DC | PRN
  Administered 2017-02-06 – 2017-02-07 (×3): 1 via ORAL
  Filled 2017-02-06 (×3): qty 1

## 2017-02-06 MED ORDER — SODIUM CHLORIDE 0.9 % IV SOLN
INTRAVENOUS | Status: DC
  Administered 2017-02-06 (×2): via INTRAVENOUS

## 2017-02-06 NOTE — Progress Notes (Signed)
PROGRESS NOTE    Howard Shepard  ZOX:[id number] DOB: 04-12-1933 DOA: 02/04/2017 PCP: Pearson Grippe, MD   Brief Narrative: 83 year old male with history of type 2 diabetes, hypertension, hyperlipidemia, dementia presented with generalized weakness, fatigue and worsening left ankle pain.  Denies any chest pain, shortness of breath.  UA was positive for UTI.  MRI of the left ankle consistent with chronic ligament tear.  Assessment & Plan:   #Left ankle pain due to chronic complete tear of anterior talofibular ligament: Patient has mild swelling and erythema and complaining of pain. -Continue IV Dilaudid, Tylenol for pain management.  Overall oxycodone. -Evaluated by orthopedics recommended ACO support which is ordered f/u with Dr. Penni Bombard after discharge. -Evaluated by PT OT recommended skilled nursing facility.  I have reviewed the above test results and PT orders recommendation with the patient's daughter.  She wanted to take patient home with home care services on discharge.  #Generalized weakness and history of dementia without behavioral disturbance: Multifactorial including left ankle pain, possible UTI.  Continue supportive care.  #Possible acute cystitis without hematuria: Currently on IV subtraction.  Urine culture multiple organisms likely contaminant.  #History of hypertension: Continue Coreg  #Hyperlipidemia on a statin  #Type 2 diabetes: Continue current oral and sliding scale.  Monitor blood sugar level History of rheumatoid arthritis on methotrexate  #Chronic kidney disease stage III: On reviewing patient's medical record patient has elevated serum creatinine level at least for 10 years.  His serum creatinine level is mildly elevated compared to on admission.  I will start on gentle IV fluid.  Repeat BMP in the morning.  I called patient's daughter and reviewed the current clinical condition test result including lab and MRI finding in detail.  DVT prophylaxis: Lovenox  subcutaneous Code Status: Full code Family Communication: Discussed with the patient's daughter over the phone Disposition Plan: Likely discharge home with home care services tomorrow    Consultants:   Orthopedics  Procedures: None Antimicrobials: Ceftriaxone  Subjective: Seen and examined at bedside.  Patient is complaining of left ankle pain.  No headache, dizziness, nausea vomiting chest pain shortness of breath.  Objective: Vitals:   02/05/17 1818 02/05/17 2137 02/06/17 0538 02/06/17 0840  BP: (!) 143/68 125/68 (!) 155/79 (!) 141/75  Pulse:  66 75 69  Resp:  12 16   Temp:  (!) 97.4 F (36.3 C) 98 F (36.7 C)   TempSrc:      SpO2:  90% 94%   Weight:   76.8 kg (169 lb 5 oz)   Height:        Intake/Output Summary (Last 24 hours) at 02/06/2017 1112 Last data filed at 02/06/2017 0541 Gross per 24 hour  Intake 390 ml  Output 475 ml  Net -85 ml   Filed Weights   02/04/17 2309 02/05/17 0355 02/06/17 0538  Weight: 79.8 kg (176 lb) 75.1 kg (165 lb 9.1 oz) 76.8 kg (169 lb 5 oz)    Examination:  General exam: Appears calm and comfortable  Respiratory system: Clear to auscultation. Respiratory effort normal. No wheezing or crackle Cardiovascular system: S1 & S2 heard, RRR.  No pedal edema. Gastrointestinal system: Abdomen is nondistended, soft and nontender. Normal bowel sounds heard. Central nervous system: Alert and oriented. No focal neurological deficits. Extremities: Left ankle mildly swollen with mild erythema. Psychiatry: Judgement and insight appear is appropriate    Data Reviewed: I have personally reviewed following labs and imaging studies  CBC: Recent Labs  Lab 02/04/17 2311 02/06/17 0353  WBC 12.4* 9.0  HGB 14.3 13.1  HCT 41.1 38.7*  MCV 93.4 94.2  PLT 240 239   Basic Metabolic Panel: Recent Labs  Lab 02/04/17 2311 02/06/17 0353  NA 134* 136  K 4.8 4.3  CL 106 105  CO2 19* 24  GLUCOSE 190* 178*  BUN 13 22*  CREATININE 1.19 1.61*    CALCIUM 8.8* 8.6*   GFR: Estimated Creatinine Clearance: 33.6 mL/min (A) (by C-G formula based on SCr of 1.61 mg/dL (H)). Liver Function Tests: No results for input(s): AST, ALT, ALKPHOS, BILITOT, PROT, ALBUMIN in the last 168 hours. No results for input(s): LIPASE, AMYLASE in the last 168 hours. No results for input(s): AMMONIA in the last 168 hours. Coagulation Profile: No results for input(s): INR, PROTIME in the last 168 hours. Cardiac Enzymes: Recent Labs  Lab 02/05/17 0342 02/05/17 0804 02/05/17 1503  TROPONINI <0.03 <0.03 <0.03   BNP (last 3 results) No results for input(s): PROBNP in the last 8760 hours. HbA1C: No results for input(s): HGBA1C in the last 72 hours. CBG: Recent Labs  Lab 02/05/17 0805 02/05/17 1128 02/05/17 1740 02/05/17 2139 02/06/17 0754  GLUCAP 201* 177* 164* 184* 137*   Lipid Profile: No results for input(s): CHOL, HDL, LDLCALC, TRIG, CHOLHDL, LDLDIRECT in the last 72 hours. Thyroid Function Tests: Recent Labs    02/05/17 0342  TSH 1.522   Anemia Panel: No results for input(s): VITAMINB12, FOLATE, FERRITIN, TIBC, IRON, RETICCTPCT in the last 72 hours. Sepsis Labs: No results for input(s): PROCALCITON, LATICACIDVEN in the last 168 hours.  Recent Results (from the past 240 hour(s))  Urine culture     Status: Abnormal   Collection Time: 02/05/17  1:01 AM  Result Value Ref Range Status   Specimen Description URINE, RANDOM  Final   Special Requests NONE  Final   Culture MULTIPLE SPECIES PRESENT, SUGGEST RECOLLECTION (A)  Final   Report Status 02/06/2017 FINAL  Final         Radiology Studies: Mr Ankle Left Wo Contrast  Result Date: 02/05/2017 CLINICAL DATA:  Ankle pain. Erythema at the lateral aspect of the ankle. EXAM: MRI OF THE LEFT ANKLE WITHOUT CONTRAST TECHNIQUE: Multiplanar, multisequence MR imaging of the ankle was performed. No intravenous contrast was administered. COMPARISON:  Radiographs dated 02/05/2017 FINDINGS:  TENDONS Peroneal: Normal. Posteromedial: Tenosynovitis involving the flexor hallucis longus tendon and the posterior tibialis tendon. Anterior: Normal. Achilles: Minimal degenerative changes of the distal Achilles tendon. No acute inflammation. Plantar Fascia: Normal. LIGAMENTS Lateral: Anterior talofibular ligament is not identified and is probably chronically torn. Posterior talofibular ligament and calcaneofibular ligament are intact. Medial: Normal. CARTILAGE Ankle Joint: Small nonspecific ankle effusion. Subtalar Joints/Sinus Tarsi: No joint effusion or chondral defect. Intact spring ligament. Bones: No significant abnormality. Other: Slight nonspecific subcutaneous edema adjacent to the distal fibula and extending below the tip of the lateral malleolus. IMPRESSION: 1. No fractures or dislocation. 2. Medial tenosynovitis. 3. Probable chronic complete tear of the anterior talofibular ligament. 4. Small nonspecific ankle joint effusion. 5. Slight nonspecific subcutaneous edema at the lateral aspect of the ankle. Electronically Signed   By: Francene Boyers M.D.   On: 02/05/2017 11:04   Dg Chest Port 1 View  Result Date: 02/05/2017 CLINICAL DATA:  Weakness for 1 day. EXAM: PORTABLE CHEST 1 VIEW COMPARISON:  12/18/2014 FINDINGS: Shallow inspiration. No airspace consolidation. Normal pulmonary vasculature. No pleural effusions. Multiple healed rib fracture deformities bilaterally. Hilar, mediastinal and cardiac contours are unchanged. IMPRESSION: No acute findings.  Shallow  inspiration. Electronically Signed   By: Ellery Plunk M.D.   On: 02/05/2017 01:39   Dg Ankle Left Port  Result Date: 02/05/2017 CLINICAL DATA:  Ankle pain for 2 days. EXAM: PORTABLE LEFT ANKLE - 2 VIEW COMPARISON:  None. FINDINGS: There is no evidence of fracture, dislocation, or joint effusion. There is no evidence of arthropathy or other focal bone abnormality. Soft tissues are unremarkable. IMPRESSION: Negative. Electronically  Signed   By: Ellery Plunk M.D.   On: 02/05/2017 01:38        Scheduled Meds: . carvedilol  12.5 mg Oral BID WC  . clopidogrel  75 mg Oral Daily  . donepezil  10 mg Oral QHS  . enoxaparin (LOVENOX) injection  40 mg Subcutaneous Q24H  . folic acid  1,000 mcg Oral Daily  . glipiZIDE  10 mg Oral BID AC  . insulin aspart  0-9 Units Subcutaneous TID WC  . magnesium oxide  200 mg Oral Daily  . [START ON 02/11/2017] methotrexate  7.5 mg Oral 2 times per day on Thu  . omega-3 acid ethyl esters  1 g Oral Daily  . pantoprazole  40 mg Oral Daily  . simvastatin  5 mg Oral q1800  . tamsulosin  0.4 mg Oral QPC supper   Continuous Infusions: . cefTRIAXone (ROCEPHIN)  IV Stopped (02/06/17 0120)     LOS: 1 day    Jhonatan Lomeli Jaynie Collins, MD [facility] Pager [phone]  If 7PM-7AM, please contact night-coverage [email or web address] Password [email or web address] 02/06/2017, 11:12 AM

## 2017-02-06 NOTE — Progress Notes (Addendum)
During shift report pt complained of sudden numbness in his left thigh. Pt denied numbness anywhere else. Pt had equal sensation on left and right face, arms, and legs. MD made aware. Will continue to assess.

## 2017-02-06 NOTE — Progress Notes (Signed)
Physical Therapy Treatment Patient Details Name: Howard Shepard MRN: [id number] DOB: 02-Aug-1933 Today's Date: 02/06/2017    History of Present Illness Howard Shepard a 83 y.o.malewithhistory of diabetes mellitus type 2, hypertension, hyperlipidemia, possible CHF presents t because of increasing weakness. Patient states over the last 2 days he has been having increasing weakness and fatigue, left ankle pain.Denies any chest pain shortness of breath nausea vomiting diarrhea. Patient has been having increasing left ankle pain denies any trauma or fall. UA positive for UTI, EKG showing left bundle branch block, x-ray and MRI of left ankle reviewed negative for any fractures chronic changes and chronic ligament tear.     PT Comments    Pt is showing progress toward goals, as he was able to ambulate today for 100 ft with rollator and min guard. Pt's gait speed began to increase at pt fatigued and needed cueing to take seated rest break. He would benefit from continued skilled PT to increase activity tolerance and safety with mobility. Will continue to follow acutely.     Follow Up Recommendations  SNF;Supervision/Assistance - 24 hour     Equipment Recommendations  Other (comment)(TBA)    Recommendations for Other Services       Precautions / Restrictions Precautions Precautions: Fall Restrictions Weight Bearing Restrictions: No    Mobility  Bed Mobility Overal bed mobility: Modified Independent             General bed mobility comments: use of bed rails and increased time  Transfers Overall transfer level: Needs assistance Equipment used: 4-wheeled walker Transfers: Sit to/from Stand Sit to Stand: Min assist;+2 physical assistance         General transfer comment: Pt required min A +2 to power up into standing with rollator.  Ambulation/Gait Ambulation/Gait assistance: Min guard;+2 safety/equipment(chair to follow) Ambulation Distance (Feet): 100  Feet Assistive device: 4-wheeled walker Gait Pattern/deviations: Step-through pattern;Decreased stride length;Trunk flexed;Shuffle     General Gait Details: Pt agreeable to ambulating with rollator as he uses one at home. Refuses use of RW. Pt with decrease stride length and increased speed as pt fatigues. Able to ambulate 100 ft with min guard and chair to follow   Stairs            Wheelchair Mobility    Modified Rankin (Stroke Patients Only)       Balance Overall balance assessment: Needs assistance Sitting-balance support: No upper extremity supported;Feet supported Sitting balance-Leahy Scale: Good     Standing balance support: Bilateral upper extremity supported;During functional activity Standing balance-Leahy Scale: Poor Standing balance comment: Pt needed UE support for balance and could not stand fully upright due to left ankle pain                            Cognition Arousal/Alertness: Awake/alert Behavior During Therapy: WFL for tasks assessed/performed Overall Cognitive Status: Impaired/Different from baseline Area of Impairment: Memory;Problem solving                     Memory: Decreased short-term memory       Problem Solving: Difficulty sequencing;Requires verbal cues        Exercises General Exercises - Lower Extremity Long Arc Quad: AROM;Both;10 reps;Supine    General Comments        Pertinent Vitals/Pain Pain Assessment: Faces Faces Pain Scale: Hurts little more Pain Location: low back, hips and ankle with ambulation Pain Descriptors / Indicators: Aching;Discomfort Pain Intervention(s):  Limited activity within patient's tolerance;Monitored during session;Repositioned    Home Living                      Prior Function            PT Goals (current goals can now be found in the care plan section) Acute Rehab PT Goals Patient Stated Goal: to go home PT Goal Formulation: With patient Time For Goal  Achievement: 02/19/17 Potential to Achieve Goals: Good Progress towards PT goals: Progressing toward goals    Frequency    Min 3X/week      PT Plan Current plan remains appropriate    Co-evaluation              AM-PAC PT "6 Clicks" Daily Activity  Outcome Measure  Difficulty turning over in bed (including adjusting bedclothes, sheets and blankets)?: None Difficulty moving from lying on back to sitting on the side of the bed? : None Difficulty sitting down on and standing up from a chair with arms (e.g., wheelchair, bedside commode, etc,.)?: Unable Help needed moving to and from a bed to chair (including a wheelchair)?: A Little Help needed walking in hospital room?: A Little Help needed climbing 3-5 steps with a railing? : Total 6 Click Score: 16    End of Session Equipment Utilized During Treatment: Gait belt Activity Tolerance: Patient tolerated treatment well Patient left: in chair;with call bell/phone within reach;with chair alarm set Nurse Communication: Mobility status PT Visit Diagnosis: Unsteadiness on feet (R26.81);Muscle weakness (generalized) (M62.81)     Time: 2637-8588 PT Time Calculation (min) (ACUTE ONLY): 22 min  Charges:  $Gait Training: 8-22 mins                    G Codes:       Howard Shepard, [place] Pager [phone] Acute Rehab  Howard Shepard 02/06/2017, 3:14 PM

## 2017-02-06 NOTE — Progress Notes (Signed)
Orthopedic Tech Progress Note Patient Details:  CORDERIUS SARACENI 1933/09/02 [id number]  Ortho Devices Type of Ortho Device: ASO Ortho Device/Splint Interventions: Application   Post Interventions Patient Tolerated: Well Instructions Provided: Care of device   Saul Fordyce 02/06/2017, 11:49 AM

## 2017-02-06 NOTE — Plan of Care (Signed)
Improving at pain wise ,

## 2017-02-07 DIAGNOSIS — I10 Essential (primary) hypertension: Secondary | ICD-10-CM

## 2017-02-07 LAB — BASIC METABOLIC PANEL
ANION GAP: 9 (ref 5–15)
BUN: 17 mg/dL (ref 6–20)
CO2: 22 mmol/L (ref 22–32)
Calcium: 8.5 mg/dL — ABNORMAL LOW (ref 8.9–10.3)
Chloride: 106 mmol/L (ref 101–111)
Creatinine, Ser: 1.22 mg/dL (ref 0.61–1.24)
GFR calc Af Amer: >60 mL/min (ref >60)
GFR calc non Af Amer: 54 mL/min — ABNORMAL LOW (ref >60)
GLUCOSE: 133 mg/dL — AB (ref 65–99)
Potassium: 3.9 mmol/L (ref 3.5–5.1)
Sodium: 137 mmol/L (ref 135–145)

## 2017-02-07 LAB — GLUCOSE, CAPILLARY: Glucose-Capillary: 114 mg/dL — ABNORMAL HIGH (ref 65–99)

## 2017-02-07 MED ORDER — ACETAMINOPHEN 325 MG PO TABS
650.0000 mg | ORAL_TABLET | Freq: Four times a day (QID) | ORAL | Status: AC | PRN
Start: 2017-02-07 — End: ?

## 2017-02-07 MED ORDER — SENNOSIDES-DOCUSATE SODIUM 8.6-50 MG PO TABS: 2.0000 | ORAL_TABLET | Freq: Every day | ORAL | 0 refills | Status: AC

## 2017-02-07 MED ORDER — OXYCODONE-ACETAMINOPHEN 5-325 MG PO TABS
1.0000 | ORAL_TABLET | Freq: Three times a day (TID) | ORAL | 0 refills | Status: DC | PRN
Start: 2017-02-07 — End: 2018-02-26

## 2017-02-07 MED ORDER — DEXTROSE 5 % IV SOLN
1.0000 g | Freq: Once | INTRAVENOUS | Status: AC
  Administered 2017-02-07: 1 g via INTRAVENOUS
  Filled 2017-02-07 (×2): qty 10

## 2017-02-07 NOTE — Progress Notes (Signed)
NURSING PROGRESS NOTE  Howard Shepard [id number] Discharge Data: 02/07/2017 12:52 PM Attending Provider: Maxie Barb, MD PCP:Kim, Fayrene Fearing, MD   Shela Leff to be D/C'd Home per MD order.    All IV's will be discontinued and monitored for bleeding.  All belongings will be returned to patient for patient to take home.  Last Documented Vital Signs:  Blood pressure (!) 149/72, pulse 80, temperature (!) 97.5 F (36.4 C), temperature source Oral, resp. rate 17, height 5\' 7"  (1.702 m), weight 78.4 kg (172 lb 13.5 oz), SpO2 94 %.  , MSN, RN, Madelin Rear

## 2017-02-07 NOTE — Discharge Summary (Signed)
Physician Discharge Summary  Howard Shepard RKY:[id number] DOB: October 13, 1933 DOA: 02/04/2017  PCP: Pearson Grippe, MD  Admit date: 02/04/2017 Discharge date: 02/07/2017  Admitted From:home Disposition:home with home care, patient and family declined skilled nursing facility.  Recommendations for Outpatient Follow-up:  1. Follow up with PCP in 1-2 weeks 2. Please obtain BMP/CBC in one week   Home Health: Yes Equipment/Devices: Left foot support Discharge Condition: Stable CODE STATUS: Full code Diet recommendation: Heart healthy  Brief/Interim Summary: 83 year old male with history of type 2 diabetes, hypertension, hyperlipidemia, dementia presented with generalized weakness, fatigue and worsening left ankle pain.  Denies any chest pain, shortness of breath.  UA was positive for UTI.  MRI of the left ankle consistent with chronic ligament tear.  #Left ankle pain due to chronic complete tear of anterior talofibular ligament: Patient has mild swelling and erythema and complaining of pain. -Evaluated by orthopedics recommended ASO support which is ordered f/u with Dr. Penni Bombard after discharge. -Evaluated by PT OT recommended skilled nursing facility.  I have reviewed the above test results and PT orders recommendation with the patient's daughter.  She wanted to take patient home with home care services on discharge. -Pain medications ordered.  Patient has foot support for ambulation.  Home care services ordered.  I have called this morning to update patient's daughter regarding discharge planning.  She agreed with the plan.  #Generalized weakness and history of dementia without behavioral disturbance: Multifactorial including left ankle pain, possible UTI.  Continue supportive care.  #Possible acute cystitis without hematuria: Completed IV antibiotics.  Urine culture multiple organisms likely contaminant.  #History of hypertension: Continue Coreg  #Hyperlipidemia on a statin  #Type 2  diabetes: Continue current oral and sliding scale.  Monitor blood sugar level History of rheumatoid arthritis on methotrexate  #Chronic kidney disease stage III: Serum creatinine level is stable.  On reviewing patient's record.  Patient has elevated serum creatinine level.  Recommend to avoid NSAIDs.  Reviewed with the patient's daughter as well.  I believe patient is medically stable to transfer his care to outpatient.  Home care services arranged.  Patient and his daughter declined nursing home facility on discharge which was recommended by physical therapy and occupational therapy.   Discharge Diagnoses:  Principal Problem:   Weakness Active Problems:   SLEEP APNEA, OBSTRUCTIVE   Essential hypertension   Congestive heart failure (HCC)   Diabetes mellitus type 2 in nonobese [facility])   Acute left ankle pain    Discharge Instructions  Discharge Instructions    Call MD for:  difficulty breathing, headache or visual disturbances   Complete by:  As directed    Call MD for:  extreme fatigue   Complete by:  As directed    Call MD for:  hives   Complete by:  As directed    Call MD for:  persistant dizziness or light-headedness   Complete by:  As directed    Call MD for:  persistant nausea and vomiting   Complete by:  As directed    Call MD for:  severe uncontrolled pain   Complete by:  As directed    Call MD for:  temperature >100.4   Complete by:  As directed    Diet - low sodium heart healthy   Complete by:  As directed    Increase activity slowly   Complete by:  As directed      Allergies as of 02/07/2017   No Known Allergies     Medication List  TAKE these medications   acetaminophen 325 MG tablet Commonly known as:  TYLENOL Take 2 tablets (650 mg total) by mouth every 6 (six) hours as needed for mild pain (or Fever >/= 101).   carvedilol 12.5 MG tablet Commonly known as:  COREG Take 12.5 mg by mouth 2 (two) times daily with a meal.   clopidogrel 75 MG  tablet Commonly known as:  PLAVIX Take 75 mg by mouth daily.   donepezil 10 MG tablet Commonly known as:  ARICEPT Take 10 mg by mouth at bedtime.   FISH OIL PO Take 1 capsule by mouth 2 (two) times daily.   folic acid 800 MCG tablet Commonly known as:  FOLVITE Take 800 mcg by mouth daily.   glipiZIDE 10 MG tablet Commonly known as:  GLUCOTROL Take 10 mg by mouth 2 (two) times daily before a meal.   Magnesium 250 MG Tabs Take 250 mg by mouth daily.   methotrexate 2.5 MG tablet Commonly known as:  RHEUMATREX Take 7.5 mg by mouth See admin instructions. Take 7.5mg  by mouth Thursday morning and 7.5mg  by mouth Thursday evening   omeprazole 20 MG capsule Commonly known as:  PRILOSEC Take 20 mg by mouth daily.   oxyCODONE-acetaminophen 5-325 MG tablet Commonly known as:  PERCOCET/ROXICET Take 1 tablet by mouth every 8 (eight) hours as needed for severe pain.   senna-docusate 8.6-50 MG tablet Commonly known as:  Senokot-S Take 2 tablets by mouth at bedtime.   simvastatin 10 MG tablet Commonly known as:  ZOCOR Take 5 mg by mouth daily at 6 PM.   tamsulosin 0.4 MG Caps capsule Commonly known as:  FLOMAX Take 0.4 mg by mouth daily after supper.   VITAMIN B 12 PO Take 1 tablet by mouth daily.      Follow-up Information    Pearson Grippe, MD. Schedule an appointment as soon as possible for a visit in 1 week(s).   Specialty:  Internal Medicine Contact information: [address] [place] [address] [place] [address] [phone]        Delfin Gant, MD. Schedule an appointment as soon as possible for a visit in 1 week(s).   Specialty:  Family Medicine Contact information: [address] [place] [address] [phone]        [facility] Follow up.   Specialty:  Home Health Services Why:  HHPT/OT/aide- arranged- they will call you to set up home visits Contact information: [address] [place]  [address] [phone]          No Known Allergies  Consultations: None  Procedures/Studies: Foot support application  Subjective: Denied headache, dizziness, nausea vomiting chest pain shortness of breath.  Pain is controlled.  Discharge Exam: Vitals:   02/07/17 0608 02/07/17 1009  BP: (!) 149/72   Pulse: 65 80  Resp: 17   Temp: (!) 97.5 F (36.4 C)   SpO2: 94%    Vitals:   02/06/17 2320 02/07/17 0352 02/07/17 0608 02/07/17 1009  BP:   (!) 149/72   Pulse:   65 80  Resp:   17   Temp:   (!) 97.5 F (36.4 C)   TempSrc:   Oral   SpO2: 92%  94%   Weight:  78.4 kg (172 lb 13.5 oz)    Height:        General: Pt is alert, awake, not in acute distress Cardiovascular: RRR, S1/S2 +, no rubs, no gallops Respiratory: CTA bilaterally, no wheezing, no  rhonchi Abdominal: Soft, NT, ND, bowel sounds + Extremities: no edema, no cyanosis, left foot has support applied.    The results of significant diagnostics from this hospitalization (including imaging, microbiology, ancillary and laboratory) are listed below for reference.     Microbiology: Recent Results (from the past 240 hour(s))  Urine culture     Status: Abnormal   Collection Time: 02/05/17  1:01 AM  Result Value Ref Range Status   Specimen Description URINE, RANDOM  Final   Special Requests NONE  Final   Culture MULTIPLE SPECIES PRESENT, SUGGEST RECOLLECTION (A)  Final   Report Status 02/06/2017 FINAL  Final     Labs: BNP (last 3 results) No results for input(s): BNP in the last 8760 hours. Basic Metabolic Panel: Recent Labs  Lab 02/04/17 2311 02/06/17 0353 02/07/17 0341  NA 134* 136 137  K 4.8 4.3 3.9  CL 106 105 106  CO2 19* 24 22  GLUCOSE 190* 178* 133*  BUN 13 22* 17  CREATININE 1.19 1.61* 1.22  CALCIUM 8.8* 8.6* 8.5*   Liver Function Tests: No results for input(s): AST, ALT, ALKPHOS, BILITOT, PROT, ALBUMIN in the last 168 hours. No results for input(s): LIPASE, AMYLASE in the last 168  hours. No results for input(s): AMMONIA in the last 168 hours. CBC: Recent Labs  Lab 02/04/17 2311 02/06/17 0353  WBC 12.4* 9.0  HGB 14.3 13.1  HCT 41.1 38.7*  MCV 93.4 94.2  PLT 240 239   Cardiac Enzymes: Recent Labs  Lab 02/05/17 0342 02/05/17 0804 02/05/17 1503  TROPONINI <0.03 <0.03 <0.03   BNP: Invalid input(s): POCBNP CBG: Recent Labs  Lab 02/06/17 0754 02/06/17 1128 02/06/17 1647 02/06/17 2214 02/07/17 0811  GLUCAP 137* 167* 176* 134* 114*   D-Dimer No results for input(s): DDIMER in the last 72 hours. Hgb A1c No results for input(s): HGBA1C in the last 72 hours. Lipid Profile No results for input(s): CHOL, HDL, LDLCALC, TRIG, CHOLHDL, LDLDIRECT in the last 72 hours. Thyroid function studies Recent Labs    02/05/17 0342  TSH 1.522   Anemia work up No results for input(s): VITAMINB12, FOLATE, FERRITIN, TIBC, IRON, RETICCTPCT in the last 72 hours. Urinalysis    Component Value Date/Time   COLORURINE YELLOW 02/04/2017 2311   APPEARANCEUR HAZY (A) 02/04/2017 2311   LABSPEC 1.021 02/04/2017 2311   PHURINE 6.0 02/04/2017 2311   GLUCOSEU NEGATIVE 02/04/2017 2311   GLUCOSEU NEGATIVE 04/30/2008 1156   HGBUR NEGATIVE 02/04/2017 2311   BILIRUBINUR NEGATIVE 02/04/2017 2311   KETONESUR 5 (A) 02/04/2017 2311   PROTEINUR NEGATIVE 02/04/2017 2311   UROBILINOGEN 0.2 12/11/2008 1647   NITRITE POSITIVE (A) 02/04/2017 2311   LEUKOCYTESUR NEGATIVE 02/04/2017 2311   Sepsis Labs Invalid input(s): PROCALCITONIN,  WBC,  LACTICIDVEN Microbiology Recent Results (from the past 240 hour(s))  Urine culture     Status: Abnormal   Collection Time: 02/05/17  1:01 AM  Result Value Ref Range Status   Specimen Description URINE, RANDOM  Final   Special Requests NONE  Final   Culture MULTIPLE SPECIES PRESENT, SUGGEST RECOLLECTION (A)  Final   Report Status 02/06/2017 FINAL  Final     Time coordinating discharge: 28 minutes  SIGNED:   Maxie Barb,  MD  [facility] 02/07/2017, 11:02 AM  If 7PM-7AM, please contact night-coverage [email or web address] Password [email or web address]

## 2017-02-07 NOTE — Care Management Note (Signed)
Case Management Note [name] RN, BSN Unit 4E-Case Manager-- 5W weekend coverage [phone]  Patient Details  Name: Howard Shepard MRN: [id number] Date of Birth: 11-May-1933  Subjective/Objective:   Pt admitted with weakness                 Action/Plan: PTA pt lived at home with wife- recommendation for SNF- however plan to return home- HH orders have been placed- spoke with pt at bedside- choice offered for [facility] agency- per pt he has used [facility] in past and is fine using them again- has needed DME at home- referral called to [facility] with [facility] for HHPT/OT/aide- daughter to transport home  Expected Discharge Date:  02/07/17               Expected Discharge Plan:  Home w Home Health Services  In-House Referral:     Discharge planning Services  CM Consult  Post Acute Care Choice:  Home Health Choice offered to:  Patient  DME Arranged:    DME Agency:     HH Arranged:  PT, OT, Nurse's Aide HH Agency:  [facility]  Status of Service:  Completed, signed off  If discussed at Long Length of Stay Meetings, dates discussed:    Discharge Disposition: home/home health   Additional Comments:  Darrold Span, RN 02/07/2017, 10:46 AM

## 2017-02-08 NOTE — Consult Note (Signed)
            [facility] CM Primary Care Navigator  02/08/2017  Howard Shepard 1933-12-10 [id number]   Attempt to seepatient at the bedsideto identify possible discharge needs buthe was alreadydischargedper staff report.   Patient was discharged homeover the weekend with home health services (family declined skilled nursing facility per MD note).  Primary care provider's officeis listed asprovidingtransition of care (TOC).  Patient has discharge instruction to schedule an appointment with primary care provider as soon as possible for a visit in 1 week.   For additional questions please contact:  Karin Golden A. Cherrill Scrima, BSN, RN-BC [facility] PRIMARY CARE Navigator Cell: [phone]

## 2017-02-09 DIAGNOSIS — Z7902 Long term (current) use of antithrombotics/antiplatelets: Secondary | ICD-10-CM | POA: Diagnosis not present

## 2017-02-09 DIAGNOSIS — M069 Rheumatoid arthritis, unspecified: Secondary | ICD-10-CM | POA: Diagnosis not present

## 2017-02-09 DIAGNOSIS — S93432D Sprain of tibiofibular ligament of left ankle, subsequent encounter: Secondary | ICD-10-CM | POA: Diagnosis not present

## 2017-02-09 DIAGNOSIS — Z7984 Long term (current) use of oral hypoglycemic drugs: Secondary | ICD-10-CM | POA: Diagnosis not present

## 2017-02-09 DIAGNOSIS — N390 Urinary tract infection, site not specified: Secondary | ICD-10-CM | POA: Diagnosis not present

## 2017-02-09 DIAGNOSIS — E119 Type 2 diabetes mellitus without complications: Secondary | ICD-10-CM | POA: Diagnosis not present

## 2017-02-09 DIAGNOSIS — I110 Hypertensive heart disease with heart failure: Secondary | ICD-10-CM | POA: Diagnosis not present

## 2017-02-09 DIAGNOSIS — G4733 Obstructive sleep apnea (adult) (pediatric): Secondary | ICD-10-CM | POA: Diagnosis not present

## 2017-02-09 DIAGNOSIS — I509 Heart failure, unspecified: Secondary | ICD-10-CM | POA: Diagnosis not present

## 2017-02-15 DIAGNOSIS — I509 Heart failure, unspecified: Secondary | ICD-10-CM | POA: Diagnosis not present

## 2017-02-15 DIAGNOSIS — N390 Urinary tract infection, site not specified: Secondary | ICD-10-CM | POA: Diagnosis not present

## 2017-02-15 DIAGNOSIS — Z7902 Long term (current) use of antithrombotics/antiplatelets: Secondary | ICD-10-CM | POA: Diagnosis not present

## 2017-02-15 DIAGNOSIS — Z7984 Long term (current) use of oral hypoglycemic drugs: Secondary | ICD-10-CM | POA: Diagnosis not present

## 2017-02-15 DIAGNOSIS — E119 Type 2 diabetes mellitus without complications: Secondary | ICD-10-CM | POA: Diagnosis not present

## 2017-02-15 DIAGNOSIS — I110 Hypertensive heart disease with heart failure: Secondary | ICD-10-CM | POA: Diagnosis not present

## 2017-02-15 DIAGNOSIS — G4733 Obstructive sleep apnea (adult) (pediatric): Secondary | ICD-10-CM | POA: Diagnosis not present

## 2017-02-15 DIAGNOSIS — S93432D Sprain of tibiofibular ligament of left ankle, subsequent encounter: Secondary | ICD-10-CM | POA: Diagnosis not present

## 2017-02-15 DIAGNOSIS — M069 Rheumatoid arthritis, unspecified: Secondary | ICD-10-CM | POA: Diagnosis not present

## 2017-02-18 DIAGNOSIS — Z7902 Long term (current) use of antithrombotics/antiplatelets: Secondary | ICD-10-CM | POA: Diagnosis not present

## 2017-02-18 DIAGNOSIS — M069 Rheumatoid arthritis, unspecified: Secondary | ICD-10-CM | POA: Diagnosis not present

## 2017-02-18 DIAGNOSIS — Z7984 Long term (current) use of oral hypoglycemic drugs: Secondary | ICD-10-CM | POA: Diagnosis not present

## 2017-02-18 DIAGNOSIS — I110 Hypertensive heart disease with heart failure: Secondary | ICD-10-CM | POA: Diagnosis not present

## 2017-02-18 DIAGNOSIS — G4733 Obstructive sleep apnea (adult) (pediatric): Secondary | ICD-10-CM | POA: Diagnosis not present

## 2017-02-18 DIAGNOSIS — N390 Urinary tract infection, site not specified: Secondary | ICD-10-CM | POA: Diagnosis not present

## 2017-02-18 DIAGNOSIS — S93432D Sprain of tibiofibular ligament of left ankle, subsequent encounter: Secondary | ICD-10-CM | POA: Diagnosis not present

## 2017-02-18 DIAGNOSIS — E119 Type 2 diabetes mellitus without complications: Secondary | ICD-10-CM | POA: Diagnosis not present

## 2017-02-18 DIAGNOSIS — I509 Heart failure, unspecified: Secondary | ICD-10-CM | POA: Diagnosis not present

## 2017-02-24 DIAGNOSIS — S93432D Sprain of tibiofibular ligament of left ankle, subsequent encounter: Secondary | ICD-10-CM | POA: Diagnosis not present

## 2017-02-24 DIAGNOSIS — I509 Heart failure, unspecified: Secondary | ICD-10-CM | POA: Diagnosis not present

## 2017-02-24 DIAGNOSIS — Z7902 Long term (current) use of antithrombotics/antiplatelets: Secondary | ICD-10-CM | POA: Diagnosis not present

## 2017-02-24 DIAGNOSIS — E119 Type 2 diabetes mellitus without complications: Secondary | ICD-10-CM | POA: Diagnosis not present

## 2017-02-24 DIAGNOSIS — N390 Urinary tract infection, site not specified: Secondary | ICD-10-CM | POA: Diagnosis not present

## 2017-02-24 DIAGNOSIS — M069 Rheumatoid arthritis, unspecified: Secondary | ICD-10-CM | POA: Diagnosis not present

## 2017-02-24 DIAGNOSIS — Z7984 Long term (current) use of oral hypoglycemic drugs: Secondary | ICD-10-CM | POA: Diagnosis not present

## 2017-02-24 DIAGNOSIS — I110 Hypertensive heart disease with heart failure: Secondary | ICD-10-CM | POA: Diagnosis not present

## 2017-02-24 DIAGNOSIS — G4733 Obstructive sleep apnea (adult) (pediatric): Secondary | ICD-10-CM | POA: Diagnosis not present

## 2017-03-01 DIAGNOSIS — I110 Hypertensive heart disease with heart failure: Secondary | ICD-10-CM | POA: Diagnosis not present

## 2017-03-01 DIAGNOSIS — G4733 Obstructive sleep apnea (adult) (pediatric): Secondary | ICD-10-CM | POA: Diagnosis not present

## 2017-03-01 DIAGNOSIS — N390 Urinary tract infection, site not specified: Secondary | ICD-10-CM | POA: Diagnosis not present

## 2017-03-01 DIAGNOSIS — Z7984 Long term (current) use of oral hypoglycemic drugs: Secondary | ICD-10-CM | POA: Diagnosis not present

## 2017-03-01 DIAGNOSIS — S93432D Sprain of tibiofibular ligament of left ankle, subsequent encounter: Secondary | ICD-10-CM | POA: Diagnosis not present

## 2017-03-01 DIAGNOSIS — E119 Type 2 diabetes mellitus without complications: Secondary | ICD-10-CM | POA: Diagnosis not present

## 2017-03-01 DIAGNOSIS — Z7902 Long term (current) use of antithrombotics/antiplatelets: Secondary | ICD-10-CM | POA: Diagnosis not present

## 2017-03-01 DIAGNOSIS — I509 Heart failure, unspecified: Secondary | ICD-10-CM | POA: Diagnosis not present

## 2017-03-01 DIAGNOSIS — M069 Rheumatoid arthritis, unspecified: Secondary | ICD-10-CM | POA: Diagnosis not present

## 2017-03-03 DIAGNOSIS — M069 Rheumatoid arthritis, unspecified: Secondary | ICD-10-CM | POA: Diagnosis not present

## 2017-03-03 DIAGNOSIS — Z7984 Long term (current) use of oral hypoglycemic drugs: Secondary | ICD-10-CM | POA: Diagnosis not present

## 2017-03-03 DIAGNOSIS — I509 Heart failure, unspecified: Secondary | ICD-10-CM | POA: Diagnosis not present

## 2017-03-03 DIAGNOSIS — Z7902 Long term (current) use of antithrombotics/antiplatelets: Secondary | ICD-10-CM | POA: Diagnosis not present

## 2017-03-03 DIAGNOSIS — E119 Type 2 diabetes mellitus without complications: Secondary | ICD-10-CM | POA: Diagnosis not present

## 2017-03-03 DIAGNOSIS — I110 Hypertensive heart disease with heart failure: Secondary | ICD-10-CM | POA: Diagnosis not present

## 2017-03-03 DIAGNOSIS — S93432D Sprain of tibiofibular ligament of left ankle, subsequent encounter: Secondary | ICD-10-CM | POA: Diagnosis not present

## 2017-03-03 DIAGNOSIS — G4733 Obstructive sleep apnea (adult) (pediatric): Secondary | ICD-10-CM | POA: Diagnosis not present

## 2017-03-03 DIAGNOSIS — N390 Urinary tract infection, site not specified: Secondary | ICD-10-CM | POA: Diagnosis not present

## 2017-06-01 ENCOUNTER — Ambulatory Visit: Payer: Medicare Other | Admitting: Podiatry

## 2017-06-01 ENCOUNTER — Ambulatory Visit (INDEPENDENT_AMBULATORY_CARE_PROVIDER_SITE_OTHER): Payer: Medicare Other

## 2017-06-01 ENCOUNTER — Encounter: Payer: Self-pay | Admitting: Podiatry

## 2017-06-01 DIAGNOSIS — M79674 Pain in right toe(s): Secondary | ICD-10-CM | POA: Diagnosis not present

## 2017-06-01 DIAGNOSIS — B351 Tinea unguium: Secondary | ICD-10-CM | POA: Diagnosis not present

## 2017-06-01 DIAGNOSIS — T1490XA Injury, unspecified, initial encounter: Secondary | ICD-10-CM

## 2017-06-01 DIAGNOSIS — S9030XA Contusion of unspecified foot, initial encounter: Secondary | ICD-10-CM | POA: Diagnosis not present

## 2017-06-01 DIAGNOSIS — M79675 Pain in left toe(s): Secondary | ICD-10-CM

## 2017-06-01 NOTE — Progress Notes (Signed)
Dg foot  

## 2017-06-02 NOTE — Progress Notes (Signed)
Subjective:   Patient ID: Howard Shepard, male   DOB: 84 y.o.   MRN: [id number]   HPI 84 year old male presents to the office today as an acute appointment.  His daughter states that he hit his toe in a door in the house several weeks ago they noticed the left third toenail has had some blood underneath it.  The patient has dementia and the daughter has noticed that the toenails have been thick and elongated and wishes the nails be trimmed as well.  Denies any other recent injury denies any swelling to the foot.  Patient's daughter is also asking if he can trim the fingernails today he has had no treatment for his feet recently has no other concerns.   Review of Systems  All other systems reviewed and are negative.  Past Medical History:  Diagnosis Date  . Diabetes mellitus without complication (HCC)   . Hypertension     Past Surgical History:  Procedure Laterality Date  . LEFT AND RIGHT HEART CATHETERIZATION WITH CORONARY ANGIOGRAM N/A 07/14/2011   Procedure: LEFT AND RIGHT HEART CATHETERIZATION WITH CORONARY ANGIOGRAM;  Surgeon: Pamella Pert, MD;  Location: [facility] CATH LAB;  Service: Cardiovascular;  Laterality: N/A;     Current Outpatient Medications:  .  acetaminophen (TYLENOL) 325 MG tablet, Take 2 tablets (650 mg total) by mouth every 6 (six) hours as needed for mild pain (or Fever >/= 101)., Disp: , Rfl:  .  carvedilol (COREG) 12.5 MG tablet, Take 12.5 mg by mouth 2 (two) times daily with a meal., Disp: , Rfl:  .  clopidogrel (PLAVIX) 75 MG tablet, Take 75 mg by mouth daily., Disp: , Rfl:  .  Cyanocobalamin (VITAMIN B 12 PO), Take 1 tablet by mouth daily., Disp: , Rfl:  .  donepezil (ARICEPT) 10 MG tablet, Take 10 mg by mouth at bedtime. , Disp: , Rfl:  .  folic acid (FOLVITE) 800 MCG tablet, Take 800 mcg by mouth daily., Disp: , Rfl:  .  glipiZIDE (GLUCOTROL) 10 MG tablet, Take 10 mg by mouth 2 (two) times daily before a meal. , Disp: , Rfl:  .  Magnesium 250 MG TABS, Take  250 mg by mouth daily., Disp: , Rfl:  .  methotrexate (RHEUMATREX) 2.5 MG tablet, Take 7.5 mg by mouth See admin instructions. Take 7.5mg  by mouth Thursday morning and 7.5mg  by mouth Thursday evening, Disp: , Rfl:  .  Omega-3 Fatty Acids (FISH OIL PO), Take 1 capsule by mouth 2 (two) times daily., Disp: , Rfl:  .  omeprazole (PRILOSEC) 20 MG capsule, Take 20 mg by mouth daily., Disp: , Rfl:  .  oxyCODONE-acetaminophen (PERCOCET/ROXICET) 5-325 MG tablet, Take 1 tablet by mouth every 8 (eight) hours as needed for severe pain., Disp: 8 tablet, Rfl: 0 .  senna-docusate (SENOKOT-S) 8.6-50 MG tablet, Take 2 tablets by mouth at bedtime. (Patient not taking: Reported on 06/01/2017), Disp: 30 tablet, Rfl: 0 .  simvastatin (ZOCOR) 10 MG tablet, Take 5 mg by mouth daily at 6 PM., Disp: , Rfl:  .  Tamsulosin HCl (FLOMAX) 0.4 MG CAPS, Take 0.4 mg by mouth daily after supper., Disp: , Rfl:   No Known Allergies  Social History   Socioeconomic History  . Marital status: Married    Spouse name: Not on file  . Number of children: Not on file  . Years of education: Not on file  . Highest education level: Not on file  Occupational History  . Not on file  Social Needs  . Financial resource strain: Not on file  . Food insecurity:    Worry: Not on file    Inability: Not on file  . Transportation needs:    Medical: Not on file    Non-medical: Not on file  Tobacco Use  . Smoking status: Former Smoker    Types: Cigars    Last attempt to quit: 01/23/1997    Years since quitting: 20.3  . Smokeless tobacco: Never Used  Substance and Sexual Activity  . Alcohol use: No  . Drug use: No  . Sexual activity: Never  Lifestyle  . Physical activity:    Days per week: Not on file    Minutes per session: Not on file  . Stress: Not on file  Relationships  . Social connections:    Talks on phone: Not on file    Gets together: Not on file    Attends religious service: Not on file    Active member of club or  organization: Not on file    Attends meetings of clubs or organizations: Not on file    Relationship status: Not on file  . Intimate partner violence:    Fear of current or ex partner: Not on file    Emotionally abused: Not on file    Physically abused: Not on file    Forced sexual activity: Not on file  Other Topics Concern  . Not on file  Social History Narrative  . Not on file         Objective:  Physical Exam  General: NAD  Dermatological: Nails are hypertrophic, dystrophic, brittle, discolored, elongated 10. No surrounding redness or drainage. Subjectively there is tenderness nails 1-5 bilaterally.  Specifically the left third digit toenail appears to be loose from the underlying nail bed and there is dried blood underneath the toenail.  Upon debridement the nail is loose and only the proximal aspect was adhered.  There is no surrounding erythema there is tenderness to the toenail itself but there is no other area pinpoint tenderness to the foot.  No open lesions or pre-ulcerative lesions are identified today.  Vascular: Dorsalis Pedis artery and Posterior Tibial artery pedal pulses are 2/4 bilateral with immedate capillary fill time.  here is no pain with calf compression, swelling, warmth, erythema.   Neruologic: Grossly intact via light touch bilateral. Protective threshold with Semmes Wienstein monofilament intact to all pedal sites bilateral.   Musculoskeletal: Tenderness of the toenails but there is no other area of tenderness.  There is no edema, erythema, increase in warmth and there is no ecchymosis.  Muscular strength 5/5 in all groups tested bilateral.      Assessment:   Symptomatic onychomycosis with third digit toenail injury left side     Plan:  -Treatment options discussed including all alternatives, risks, and complications -Etiology of symptoms were discussed -X-rays were obtained and reviewed.  No evidence of acute fracture identified. -The left third  digit toenail is loose and there is dried blood.  Upon debridement there was no underlying ulceration and I was able to remove some dried blood.  There is no drainage or any signs of infection. -Nails debrided 10 without complications or bleeding. -Daily foot inspection -Discussed we cannot trim his fingernails. -Follow-up in 3 months or sooner if any problems arise. In the meantime, encouraged to call the office with any questions, concerns, change in symptoms.   Ovid Curd, DPM

## 2017-06-03 ENCOUNTER — Ambulatory Visit: Payer: Medicare Other | Admitting: Podiatry

## 2017-08-31 ENCOUNTER — Ambulatory Visit: Payer: Medicare Other | Admitting: Sports Medicine

## 2017-09-11 ENCOUNTER — Emergency Department (HOSPITAL_BASED_OUTPATIENT_CLINIC_OR_DEPARTMENT_OTHER)
Admission: EM | Admit: 2017-09-11 | Discharge: 2017-09-11 | Disposition: A | Discharge status: Home / Self Care | Payer: Medicare Other | Attending: Emergency Medicine | Admitting: Emergency Medicine

## 2017-09-11 ENCOUNTER — Other Ambulatory Visit: Payer: Self-pay

## 2017-09-11 ENCOUNTER — Encounter (HOSPITAL_BASED_OUTPATIENT_CLINIC_OR_DEPARTMENT_OTHER): Payer: Self-pay | Admitting: *Deleted

## 2017-09-11 ENCOUNTER — Emergency Department (HOSPITAL_BASED_OUTPATIENT_CLINIC_OR_DEPARTMENT_OTHER): Payer: Medicare Other

## 2017-09-11 DIAGNOSIS — Z87891 Personal history of nicotine dependence: Secondary | ICD-10-CM | POA: Insufficient documentation

## 2017-09-11 DIAGNOSIS — M87851 Other osteonecrosis, right femur: Secondary | ICD-10-CM | POA: Diagnosis not present

## 2017-09-11 DIAGNOSIS — I110 Hypertensive heart disease with heart failure: Secondary | ICD-10-CM | POA: Diagnosis not present

## 2017-09-11 DIAGNOSIS — Z7902 Long term (current) use of antithrombotics/antiplatelets: Secondary | ICD-10-CM | POA: Insufficient documentation

## 2017-09-11 DIAGNOSIS — Z79899 Other long term (current) drug therapy: Secondary | ICD-10-CM | POA: Insufficient documentation

## 2017-09-11 DIAGNOSIS — M25511 Pain in right shoulder: Secondary | ICD-10-CM

## 2017-09-11 DIAGNOSIS — E119 Type 2 diabetes mellitus without complications: Secondary | ICD-10-CM | POA: Insufficient documentation

## 2017-09-11 DIAGNOSIS — M87852 Other osteonecrosis, left femur: Secondary | ICD-10-CM | POA: Diagnosis not present

## 2017-09-11 DIAGNOSIS — I509 Heart failure, unspecified: Secondary | ICD-10-CM | POA: Insufficient documentation

## 2017-09-11 HISTORY — DX: Heart failure, unspecified: I50.9

## 2017-09-11 MED ORDER — OXYCODONE-ACETAMINOPHEN 5-325 MG PO TABS
1.0000 | ORAL_TABLET | Freq: Once | ORAL | Status: AC
Start: 2017-09-11 — End: 2017-09-11
  Administered 2017-09-11: 1 via ORAL
  Filled 2017-09-11: qty 1

## 2017-09-11 MED ORDER — OXYCODONE-ACETAMINOPHEN 5-325 MG PO TABS: 1.0000 | ORAL_TABLET | Freq: Four times a day (QID) | ORAL | 0 refills | Status: DC | PRN

## 2017-09-11 MED ORDER — CYCLOBENZAPRINE HCL 5 MG PO TABS
5.0000 mg | ORAL_TABLET | Freq: Once | ORAL | Status: AC
  Administered 2017-09-11: 5 mg via ORAL
  Filled 2017-09-11: qty 1

## 2017-09-11 NOTE — ED Provider Notes (Signed)
[facility] EMERGENCY DEPARTMENT Provider Note   CSN: [id number] Arrival date & time: 09/11/17  1427     History   Chief Complaint Chief Complaint  Patient presents with  . Shoulder Pain    HPI Howard GUTERREZ is a 84 y.o. male.  The history is provided by the patient and medical records. No language interpreter was used.  Shoulder Pain   This is a recurrent problem. The current episode started more than 2 days ago. The problem occurs constantly. The problem has not changed since onset.The pain is present in the right shoulder. The quality of the pain is described as aching. The pain is at a severity of 10/10. The pain is severe. Associated symptoms include limited range of motion and stiffness. Pertinent negatives include no numbness. He has tried nothing for the symptoms. The treatment provided no relief. There has been no history of extremity trauma. Family history is significant for rheumatoid arthritis.    Past Medical History:  Diagnosis Date  . CHF (congestive heart failure) (HCC)   . Diabetes mellitus without complication (HCC)   . Hypertension     Patient Active Problem List   Diagnosis Date Noted  . Acute left ankle pain   . Weakness 02/05/2017  . Diabetes mellitus type 2 in nonobese (HCC) 02/05/2017  . Acute cystitis without hematuria   . SHINGLES 07/16/2008  . LUMBAR RADICULOPATHY, LEFT 04/30/2008  . ABDOMINAL PAIN, LEFT LOWER QUADRANT 09/07/2007  . HYPERLIPIDEMIA 02/28/2007  . Essential hypertension 02/28/2007  . GERD 02/28/2007  . SKIN LESIONS, MULTIPLE 02/28/2007  . DIABETES MELLITUS, TYPE II 09/28/2006  . DEPRESSION 09/28/2006  . SLEEP APNEA, OBSTRUCTIVE 09/28/2006  . Congestive heart failure (HCC) 09/28/2006  . HEMORRHOIDS, INTERNAL, WITH BLEEDING 09/28/2006  . ALLERGIC RHINITIS 09/28/2006  . Peptic ulcer, unspecified site, unspecified as acute or chronic, without mention of hemorrhage, perforation, or obstruction 09/28/2006  . ARTHRITIS,  RHEUMATOID 09/28/2006  . LOW BACK PAIN 09/28/2006  . URINARY INCONTINENCE 09/28/2006  . TRANSIENT ISCHEMIC ATTACK, HX OF 09/28/2006  . COLONIC POLYPS, HX OF 09/28/2006  . Personal history of urinary calculi 09/28/2006    Past Surgical History:  Procedure Laterality Date  . LEFT AND RIGHT HEART CATHETERIZATION WITH CORONARY ANGIOGRAM N/A 07/14/2011   Procedure: LEFT AND RIGHT HEART CATHETERIZATION WITH CORONARY ANGIOGRAM;  Surgeon: Pamella Pert, MD;  Location: [facility] CATH LAB;  Service: Cardiovascular;  Laterality: N/A;        Home Medications    Prior to Admission medications   Medication Sig Start Date End Date Taking? Authorizing Provider  acetaminophen (TYLENOL) 325 MG tablet Take 2 tablets (650 mg total) by mouth every 6 (six) hours as needed for mild pain (or Fever >/= 101). 02/07/17   Maxie Barb, MD  carvedilol (COREG) 12.5 MG tablet Take 12.5 mg by mouth 2 (two) times daily with a meal.    [provider]  clopidogrel (PLAVIX) 75 MG tablet Take 75 mg by mouth daily. 09/21/16   [provider]  Cyanocobalamin (VITAMIN B 12 PO) Take 1 tablet by mouth daily.    [provider]  donepezil (ARICEPT) 10 MG tablet Take 10 mg by mouth at bedtime.     [provider]  folic acid (FOLVITE) 800 MCG tablet Take 800 mcg by mouth daily.    [provider]  glipiZIDE (GLUCOTROL) 10 MG tablet Take 10 mg by mouth 2 (two) times daily before a meal.     [provider]  Magnesium 250 MG TABS Take 250 mg by mouth daily.    [provider]  methotrexate (RHEUMATREX) 2.5 MG tablet Take 7.5 mg by mouth See admin instructions. Take 7.5mg  by mouth Thursday morning and 7.5mg  by mouth Thursday evening    [provider]  Omega-3 Fatty Acids (FISH OIL PO) Take 1 capsule by mouth 2 (two) times daily.    [provider]  omeprazole (PRILOSEC) 20 MG capsule Take 20 mg by mouth daily.    [provider]    oxyCODONE-acetaminophen (PERCOCET/ROXICET) 5-325 MG tablet Take 1 tablet by mouth every 8 (eight) hours as needed for severe pain. 02/07/17   Maxie Barb, MD  senna-docusate (SENOKOT-S) 8.6-50 MG tablet Take 2 tablets by mouth at bedtime. Patient not taking: Reported on 06/01/2017 02/07/17 02/07/18  Maxie Barb, MD  simvastatin (ZOCOR) 10 MG tablet Take 5 mg by mouth daily at 6 PM.    [provider]  Tamsulosin HCl (FLOMAX) 0.4 MG CAPS Take 0.4 mg by mouth daily after supper.    [provider]    Family History Family History  Problem Relation Age of Onset  . Hypertension Other     Social History Social History   Tobacco Use  . Smoking status: Former Smoker    Types: Cigars    Last attempt to quit: 01/23/1997    Years since quitting: 20.6  . Smokeless tobacco: Never Used  Substance Use Topics  . Alcohol use: No  . Drug use: No     Allergies   Patient has no known allergies.   Review of Systems Review of Systems  Constitutional: Negative for chills, diaphoresis, fatigue and fever.  HENT: Negative for congestion.   Eyes: Negative for visual disturbance.  Respiratory: Negative for cough, chest tightness, shortness of breath and wheezing.   Cardiovascular: Negative for chest pain, palpitations and leg swelling.  Gastrointestinal: Negative for abdominal pain, constipation, diarrhea, nausea and vomiting.  Genitourinary: Negative for flank pain and frequency.  Musculoskeletal: Positive for stiffness. Negative for back pain, neck pain and neck stiffness.  Skin: Negative for rash and wound.  Neurological: Negative for light-headedness, numbness and headaches.  Psychiatric/Behavioral: Negative for agitation.  All other systems reviewed and are negative.    Physical Exam Updated Vital Signs BP (!) 146/72 (BP Location: Left Arm)   Pulse 87   Temp 97.9 F (36.6 C) (Oral)   Resp 19   Ht 5' 7.5" (1.715 m)   Wt 75.8 kg (167 lb)   SpO2  97%   BMI 25.77 kg/m   Physical Exam  Constitutional: He is oriented to person, place, and time. He appears well-developed and well-nourished. No distress.  HENT:  Head: Normocephalic and atraumatic.  Mouth/Throat: Oropharynx is clear and moist. No oropharyngeal exudate.  Eyes: Pupils are equal, round, and reactive to light. Conjunctivae and EOM are normal.  Neck: Normal range of motion. Neck supple.  Cardiovascular: Normal rate and regular rhythm.  No murmur heard. Pulmonary/Chest: Effort normal and breath sounds normal. No respiratory distress. He has no wheezes. He exhibits no tenderness.  Abdominal: Soft. There is no tenderness.  Musculoskeletal: He exhibits tenderness. He exhibits no edema or deformity.       Right shoulder: He exhibits tenderness, pain and spasm. He exhibits no deformity and no laceration.       Arms: Symmetric grip strength.  Normal sensation.  Normal leg range of motion.  Pain with shoulder manipulation of the right.  No tenderness in  the left hip.  Lymphadenopathy:    He has no cervical adenopathy.  Neurological: He is alert and oriented to person, place, and time. He exhibits normal muscle tone.  Skin: Skin is warm and dry. Capillary refill takes less than 2 seconds. He is not diaphoretic. No erythema. No pallor.  Psychiatric: He has a normal mood and affect.  Nursing note and vitals reviewed.    ED Treatments / Results  Labs (all labs ordered are listed, but only abnormal results are displayed) Labs Reviewed - No data to display  EKG None  Radiology Dg Shoulder Right  Result Date: 09/11/2017 CLINICAL DATA:  Chronic pain EXAM: RIGHT SHOULDER - 2+ VIEW COMPARISON:  September 16, 2009 FINDINGS: Frontal and Y scapular images were obtained. There is evidence of an old fracture of the lateral right clavicle with remodeling. There is no acute fracture or dislocation. There is moderate generalized joint space narrowing. No erosive change. Bones are osteoporotic.  There are old healed rib fractures on the right. Visualized right lung clear. IMPRESSION: Old trauma involving the lateral right clavicle as well as several old right-sided healed rib fractures. No acute fracture or dislocation. Moderate generalized osteoarthritic change. Bones appear somewhat osteoporotic. Electronically Signed   By: Bretta Bang III M.D.   On: 09/11/2017 16:06   Dg Hip Unilat W Or Wo Pelvis 2-3 Views Left  Result Date: 09/11/2017 CLINICAL DATA:  Chronic pain EXAM: DG HIP (WITH OR WITHOUT PELVIS) 2-3V LEFT COMPARISON:  October 09, 2016. FINDINGS: Frontal pelvis as well as frontal and lateral right hip images were obtained. No fracture or dislocation. There is symmetric narrowing both hip joints. There is sclerosis in each femoral head, more on the right than on the left, consistent with a degree of avascular necrosis. No erosion. There is underlying osteoporosis. IMPRESSION: Evidence of avascular necrosis in each femoral head, with greater sclerosis on the left than on the right. Underlying osteoporosis. No fracture or dislocation. There is moderate symmetric narrowing of each hip joint. From an imaging standpoint, MR is the optimal imaging study of choice for avascular necrosis assessment. Electronically Signed   By: Bretta Bang III M.D.   On: 09/11/2017 16:08    Procedures Procedures (including critical care time)  Medications Ordered in ED Medications  cyclobenzaprine (FLEXERIL) tablet 5 mg (5 mg Oral Given 09/11/17 1547)  oxyCODONE-acetaminophen (PERCOCET/ROXICET) 5-325 MG per tablet 1 tablet (1 tablet Oral Given 09/11/17 1706)     Initial Impression / Assessment and Plan / ED Course  I have reviewed the triage vital signs and the nursing notes.  Pertinent labs & imaging results that were available during my care of the patient were reviewed by me and considered in my medical decision making (see chart for details).     Howard Shepard is a 84 y.o. male with a  past medical history significant for CHF, hypertension, diabetes, GERD, rheumatoid arthritis on methotrexate, and hyperlipidemia who presents with pain in his right shoulder and left hip.  Patient reports that he has had pain for numerous months but over the last several days has had worsening right shoulder pain.  He also was complaining of left hip pain to his family.  Patient now says he is not having left hip pain but is still complaining of 10 out of 10 pain in his right shoulder.  He denies falls or recent injuries.  He denies lifting anything heavy or pulling it.  He denies any fevers, chills, chest pain, shortness of  breath.  He denies any neck pain or neck stiffness.  He denies any numbness, tingling, or weakness of the upper extremities.  He is right-handed.  On exam, patient has tenderness in the right shoulder and right lateral clavicular area.  Patient had pain with shoulder movement.  Normal sensation and grip strength in the arms.  Normal deltoid area sensation.  Normal capillary refill.  Patient has history of surgeries in his right hand and wrist from prior arthritis.  Patient's left hip was nontender and patient had no pain with left hip movement.  Normal sensation and strength in upper extremities.  No evidence of trauma seen.  Lungs clear chest nontender.  Abdomen nontender.  Clinically I suspect patient has had worsening of his chronic rheumatoid arthritis and chronic arthritis pains.  However, cannot rule out dislocation given the patient's family report that they thought his shoulder looks slightly different.  X-ray pain of the shoulder and the hip.  Hip showed avascular necrosis and osteoporosis.  With his lack of traumatic injuries or current pain, doubt occult infection.  For the shoulder, patient was found to have old injuries of the clavicle and ribs but no evidence of new fracture or dislocation.  Osteo-porosis and arthritis was appreciated.  Suspect his pain is likely acute on  chronic.   While awaiting x-rays, shared decision making conversation held with family about medication use.  Patient is already had success with Percocet at home although he used his last one today.  Patient has never used muscle relaxants before.  Family requested a muscle relaxant as he did have palpable muscle spasms in the right upper back area.  We discussed the risks of muscle relaxants with people over 65 and they agreed to try a low-dose.  This was ordered.  Next  Patient will be reassessed.  Anticipate discharge to follow-up with outpatient orthopedics for further management.  5:01 PM X-ray showed old trauma in the right clavicle and right ribs.  No evidence of acute fracture dislocation.  Osteoarthritic changes and osteoporotic changes seen.  Suspect the shoulder pain is due to acute on chronic arthritis pain.  Patient's left hip x-ray showed avascular necrosis and osteoporosis with arthritis.  Suspect this is a chronic pain in his left hip that has resolved at this time.  Shared decision making conversation held with the patient and family.  Patient was placed into an immobilizer sling so we cannot continue to move his shoulder worsening his pain.  Patient is to follow-up with an orthopedist for further evaluation and management of his acute on chronic joint problems.  They report that he used his last Percocet today.  Patient will be given a prescription for several doses of the pain medication to get him to his refill next week.  Patient's family says that they have Lidoderm patches at home to use in conjunction with the patch.  A long shared decision-making conversation was held as to the risks of sedating pain medications muscle relaxants with the patient.  Family agreed with using the medications and the risks of altered mental status, confusion, and injuries.  They  understand follow-up instructions.  Next  Patient discharged in good condition after a dose of pain medication.  Final  Clinical Impressions(s) / ED Diagnoses   Final diagnoses:  Right shoulder pain, unspecified chronicity    ED Discharge Orders        Ordered    oxyCODONE-acetaminophen (PERCOCET/ROXICET) 5-325 MG tablet  Every 6 hours PRN  09/11/17 1659     Clinical Impression: 1. Right shoulder pain, unspecified chronicity     Disposition: Discharge  Condition: Good  I have discussed the results, Dx and Tx plan with the pt(& family if present). He/she/they expressed understanding and agree(s) with the plan. Discharge instructions discussed at great length. Strict return precautions discussed and pt &/or family have verbalized understanding of the instructions. No further questions at time of discharge.    New Prescriptions   OXYCODONE-ACETAMINOPHEN (PERCOCET/ROXICET) 5-325 MG TABLET    Take 1 tablet by mouth every 6 (six) hours as needed for severe pain.    Follow Up: Lenda Kelp, MD [address] [place] [address] [phone]        Charnita Trudel, Canary Brim, MD 09/12/17 [phone]

## 2017-09-11 NOTE — Discharge Instructions (Signed)
Your x-ray today showed evidence of continued arthritis and old injuries with her shoulder.  I suspect you are having arthritis pain when you move your shoulder.  We also saw evidence of the osteoporosis and avascular necrosis in the left hip.  Given the lack of injuries or pain with movement today, doubt hidden fracture.  Please use the sling immobilizer to help prevent worsening shoulder pain.  Please use the Lidoderm patches you have at home to help with the shoulder and you may use the pain medicine we prescribed today to help get him to a orthopedist appointment in several days.  If any symptoms change or worsen, please return to the nearest emergency department.

## 2017-09-11 NOTE — ED Notes (Signed)
MD informed pt requesting pain medication.

## 2017-09-11 NOTE — ED Triage Notes (Signed)
Pt reports chronic right shoulder pain for years. Flare up of pain over the last few days. Denies falls or new injury.

## 2017-09-11 NOTE — ED Notes (Signed)
ED Provider at bedside. 

## 2018-02-26 ENCOUNTER — Emergency Department (HOSPITAL_COMMUNITY): Payer: Medicare Other

## 2018-02-26 ENCOUNTER — Emergency Department (HOSPITAL_COMMUNITY)
Admission: EM | Admit: 2018-02-26 | Discharge: 2018-02-26 | Disposition: A | Discharge status: Home / Self Care | Payer: Medicare Other | Attending: Emergency Medicine | Admitting: Emergency Medicine

## 2018-02-26 ENCOUNTER — Encounter (HOSPITAL_COMMUNITY): Payer: Self-pay

## 2018-02-26 DIAGNOSIS — E119 Type 2 diabetes mellitus without complications: Secondary | ICD-10-CM | POA: Insufficient documentation

## 2018-02-26 DIAGNOSIS — N390 Urinary tract infection, site not specified: Secondary | ICD-10-CM | POA: Diagnosis not present

## 2018-02-26 DIAGNOSIS — Z79899 Other long term (current) drug therapy: Secondary | ICD-10-CM | POA: Diagnosis not present

## 2018-02-26 DIAGNOSIS — M549 Dorsalgia, unspecified: Secondary | ICD-10-CM | POA: Diagnosis present

## 2018-02-26 DIAGNOSIS — I509 Heart failure, unspecified: Secondary | ICD-10-CM | POA: Diagnosis not present

## 2018-02-26 DIAGNOSIS — I110 Hypertensive heart disease with heart failure: Secondary | ICD-10-CM | POA: Diagnosis not present

## 2018-02-26 DIAGNOSIS — Z87442 Personal history of urinary calculi: Secondary | ICD-10-CM | POA: Insufficient documentation

## 2018-02-26 DIAGNOSIS — Z7984 Long term (current) use of oral hypoglycemic drugs: Secondary | ICD-10-CM | POA: Diagnosis not present

## 2018-02-26 DIAGNOSIS — Z7902 Long term (current) use of antithrombotics/antiplatelets: Secondary | ICD-10-CM | POA: Diagnosis not present

## 2018-02-26 DIAGNOSIS — M5489 Other dorsalgia: Secondary | ICD-10-CM | POA: Diagnosis not present

## 2018-02-26 DIAGNOSIS — R1084 Generalized abdominal pain: Secondary | ICD-10-CM | POA: Diagnosis not present

## 2018-02-26 DIAGNOSIS — K5730 Diverticulosis of large intestine without perforation or abscess without bleeding: Secondary | ICD-10-CM | POA: Diagnosis not present

## 2018-02-26 DIAGNOSIS — Z87891 Personal history of nicotine dependence: Secondary | ICD-10-CM | POA: Diagnosis not present

## 2018-02-26 DIAGNOSIS — R0902 Hypoxemia: Secondary | ICD-10-CM | POA: Diagnosis not present

## 2018-02-26 LAB — CBC
HEMATOCRIT: 39.4 % (ref 39.0–52.0)
Hemoglobin: 13 g/dL (ref 13.0–17.0)
MCH: 31.5 pg (ref 26.0–34.0)
MCHC: 33 g/dL (ref 30.0–36.0)
MCV: 95.4 fL (ref 80.0–100.0)
NRBC: 0 % (ref 0.0–0.2)
PLATELETS: 235 10*3/uL (ref 150–400)
RBC: 4.13 MIL/uL — AB (ref 4.22–5.81)
RDW: 13.5 % (ref 11.5–15.5)
WBC: 6 10*3/uL (ref 4.0–10.5)

## 2018-02-26 LAB — URINALYSIS, ROUTINE W REFLEX MICROSCOPIC
BILIRUBIN URINE: NEGATIVE
GLUCOSE, UA: 50 mg/dL — AB
KETONES UR: NEGATIVE mg/dL
NITRITE: NEGATIVE
PROTEIN: NEGATIVE mg/dL
SPECIFIC GRAVITY, URINE: 1.019 (ref 1.005–1.030)
pH: 5 (ref 5.0–8.0)

## 2018-02-26 LAB — BASIC METABOLIC PANEL
Anion gap: 11 (ref 5–15)
BUN: 21 mg/dL (ref 8–23)
CALCIUM: 8.4 mg/dL — AB (ref 8.9–10.3)
CHLORIDE: 106 mmol/L (ref 98–111)
CO2: 18 mmol/L — AB (ref 22–32)
CREATININE: 1.24 mg/dL (ref 0.61–1.24)
GFR calc Af Amer: >60 mL/min (ref >60)
GFR calc non Af Amer: 54 mL/min — ABNORMAL LOW (ref >60)
Glucose, Bld: 261 mg/dL — ABNORMAL HIGH (ref 70–99)
POTASSIUM: 3.9 mmol/L (ref 3.5–5.1)
SODIUM: 135 mmol/L (ref 135–145)

## 2018-02-26 MED ORDER — CARVEDILOL 12.5 MG PO TABS
12.5000 mg | ORAL_TABLET | Freq: Once | ORAL | Status: AC
Start: 2018-02-26 — End: 2018-02-26
  Administered 2018-02-26: 12.5 mg via ORAL
  Filled 2018-02-26: qty 1

## 2018-02-26 MED ORDER — SODIUM CHLORIDE 0.9 % IV BOLUS
500.0000 mL | Freq: Once | INTRAVENOUS | Status: AC
  Administered 2018-02-26: 500 mL via INTRAVENOUS

## 2018-02-26 MED ORDER — OXYCODONE-ACETAMINOPHEN 5-325 MG PO TABS: 1.0000 | ORAL_TABLET | Freq: Four times a day (QID) | ORAL | 0 refills | Status: DC | PRN

## 2018-02-26 MED ORDER — SODIUM CHLORIDE 0.9 % IV SOLN
INTRAVENOUS | Status: DC
  Administered 2018-02-26: 11:00:00 via INTRAVENOUS

## 2018-02-26 MED ORDER — CEPHALEXIN 250 MG PO CAPS: 250.0000 mg | ORAL_CAPSULE | Freq: Four times a day (QID) | ORAL | 0 refills | Status: DC

## 2018-02-26 MED ORDER — SODIUM CHLORIDE 0.9 % IV SOLN
1.0000 g | Freq: Once | INTRAVENOUS | Status: AC
  Administered 2018-02-26: 1 g via INTRAVENOUS
  Filled 2018-02-26: qty 10

## 2018-02-26 NOTE — ED Provider Notes (Signed)
[facility]-EMERGENCY DEPT Provider Note   CSN: [id number] Arrival date & time: 02/26/18  3846     History   Chief Complaint Chief Complaint  Patient presents with  . Back Pain  . Flank Pain    HPI Howard Shepard is a 85 y.o. male.  Patient is an 85 year old male with a history of CHF, diabetes, hypertension, recurrent UTIs and kidney stones who is presenting today with 2 days of worsening left back pain.  Patient denies radiation of pain but states the pain was a 10 out of 10 today and he was not going to be able to ride to go to the [place].  Patient's daughter is also in the room providing history and states that in mid December he was treated for urinary tract infection and he was hospitalized at the [place] but did not receive a CT to evaluate if he had a kidney stone.  He improved after taking the antibiotics but she says he usually has to take a double dose of antibiotics or the infection returns.  He has had no nausea, vomiting, diarrhea.  No known fever.  Patient was given pain medication prior to me evaluating him and he said it helped significantly.  He denies any cough, shortness of breath, chest pain.  There is been no leg swelling.  Other than the antibiotic no recent medication changes.  The history is provided by the patient.    Past Medical History:  Diagnosis Date  . CHF (congestive heart failure) (HCC)   . Diabetes mellitus without complication (HCC)   . Hypertension     Patient Active Problem List   Diagnosis Date Noted  . Acute left ankle pain   . Weakness 02/05/2017  . Diabetes mellitus type 2 in nonobese (HCC) 02/05/2017  . Acute cystitis without hematuria   . SHINGLES 07/16/2008  . LUMBAR RADICULOPATHY, LEFT 04/30/2008  . ABDOMINAL PAIN, LEFT LOWER QUADRANT 09/07/2007  . HYPERLIPIDEMIA 02/28/2007  . Essential hypertension 02/28/2007  . GERD 02/28/2007  . SKIN LESIONS, MULTIPLE 02/28/2007  . DIABETES MELLITUS, TYPE II 09/28/2006  .  DEPRESSION 09/28/2006  . SLEEP APNEA, OBSTRUCTIVE 09/28/2006  . Congestive heart failure (HCC) 09/28/2006  . HEMORRHOIDS, INTERNAL, WITH BLEEDING 09/28/2006  . ALLERGIC RHINITIS 09/28/2006  . Peptic ulcer, unspecified site, unspecified as acute or chronic, without mention of hemorrhage, perforation, or obstruction 09/28/2006  . ARTHRITIS, RHEUMATOID 09/28/2006  . LOW BACK PAIN 09/28/2006  . URINARY INCONTINENCE 09/28/2006  . TRANSIENT ISCHEMIC ATTACK, HX OF 09/28/2006  . COLONIC POLYPS, HX OF 09/28/2006  . Personal history of urinary calculi 09/28/2006    Past Surgical History:  Procedure Laterality Date  . LEFT AND RIGHT HEART CATHETERIZATION WITH CORONARY ANGIOGRAM N/A 07/14/2011   Procedure: LEFT AND RIGHT HEART CATHETERIZATION WITH CORONARY ANGIOGRAM;  Surgeon: Pamella Pert, MD;  Location: [facility] CATH LAB;  Service: Cardiovascular;  Laterality: N/A;        Home Medications    Prior to Admission medications   Medication Sig Start Date End Date Taking? Authorizing Provider  acetaminophen (TYLENOL) 325 MG tablet Take 2 tablets (650 mg total) by mouth every 6 (six) hours as needed for mild pain (or Fever >/= 101). 02/07/17  Yes Maxie Barb, MD  carvedilol (COREG) 12.5 MG tablet Take 12.5 mg by mouth 2 (two) times daily with a meal.   Yes [provider]  clopidogrel (PLAVIX) 75 MG tablet Take 75 mg by mouth daily. 09/21/16  Yes [provider]  Cyanocobalamin (VITAMIN B 12 PO) Take 1 tablet by mouth daily.   Yes [provider]  donepezil (ARICEPT) 10 MG tablet Take 10 mg by mouth at bedtime.    Yes [provider]  folic acid (FOLVITE) 800 MCG tablet Take 800 mcg by mouth daily.   Yes [provider]  glipiZIDE (GLUCOTROL) 10 MG tablet Take 10 mg by mouth 2 (two) times daily before a meal.    Yes [provider]  Magnesium 250 MG TABS Take 250 mg by mouth daily.   Yes [provider]  methotrexate (RHEUMATREX)  2.5 MG tablet Take 7.5 mg by mouth See admin instructions. Take 7.5mg  by mouth Thursday morning and 7.5mg  by mouth Thursday evening   Yes [provider]  Omega-3 Fatty Acids (FISH OIL PO) Take 1 capsule by mouth 2 (two) times daily.   Yes [provider]  omeprazole (PRILOSEC) 20 MG capsule Take 20 mg by mouth daily.   Yes [provider]  oxyCODONE-acetaminophen (PERCOCET/ROXICET) 5-325 MG tablet Take 1 tablet by mouth every 6 (six) hours as needed for severe pain. 09/11/17  Yes Tegeler, Canary Brim, MD  simvastatin (ZOCOR) 10 MG tablet Take 5 mg by mouth daily at 6 PM.   Yes [provider]  Tamsulosin HCl (FLOMAX) 0.4 MG CAPS Take 0.4 mg by mouth daily after supper.   Yes [provider]  oxyCODONE-acetaminophen (PERCOCET/ROXICET) 5-325 MG tablet Take 1 tablet by mouth every 8 (eight) hours as needed for severe pain. 02/07/17   Maxie Barb, MD    Family History Family History  Problem Relation Age of Onset  . Hypertension Other     Social History Social History   Tobacco Use  . Smoking status: Former Smoker    Types: Cigars    Last attempt to quit: 01/23/1997    Years since quitting: 21.1  . Smokeless tobacco: Never Used  Substance Use Topics  . Alcohol use: No  . Drug use: No     Allergies   Patient has no known allergies.   Review of Systems Review of Systems  All other systems reviewed and are negative.    Physical Exam Updated Vital Signs BP (!) 151/80 (BP Location: Right Arm)   Pulse 66   Temp (!) 97.5 F (36.4 C) (Oral)   Resp 16   SpO2 93%   Physical Exam Vitals signs and nursing note reviewed.  Constitutional:      General: He is not in acute distress.    Appearance: He is well-developed.  HENT:     Head: Normocephalic and atraumatic.  Eyes:     Conjunctiva/sclera: Conjunctivae normal.     Pupils: Pupils are equal, round, and reactive to light.  Neck:     Musculoskeletal: Normal range of  motion and neck supple.  Cardiovascular:     Rate and Rhythm: Normal rate and regular rhythm.     Heart sounds: No murmur.  Pulmonary:     Effort: Pulmonary effort is normal. No respiratory distress.     Breath sounds: Normal breath sounds. No wheezing or rales.  Abdominal:     General: There is no distension.     Palpations: Abdomen is soft.     Tenderness: There is no abdominal tenderness. There is left CVA tenderness. There is no guarding or rebound.  Musculoskeletal: Normal range of motion.        General: No tenderness.     Right lower leg: No edema.  Left lower leg: No edema.  Skin:    General: Skin is warm and dry.     Findings: No erythema or rash.  Neurological:     Mental Status: He is alert and oriented to person, place, and time.  Psychiatric:        Behavior: Behavior normal.      ED Treatments / Results  Labs (all labs ordered are listed, but only abnormal results are displayed) Labs Reviewed  URINALYSIS, ROUTINE W REFLEX MICROSCOPIC - Abnormal; Notable for the following components:      Result Value   Glucose, UA 50 (*)    Hgb urine dipstick SMALL (*)    Leukocytes, UA MODERATE (*)    Bacteria, UA MANY (*)    All other components within normal limits  BASIC METABOLIC PANEL - Abnormal; Notable for the following components:   CO2 18 (*)    Glucose, Bld 261 (*)    Calcium 8.4 (*)    GFR calc non Af Amer 54 (*)    All other components within normal limits  CBC - Abnormal; Notable for the following components:   RBC 4.13 (*)    All other components within normal limits  URINE CULTURE    EKG None  Radiology Ct Renal Stone Study  Result Date: 02/26/2018 CLINICAL DATA:  Left low back pain EXAM: CT ABDOMEN AND PELVIS WITHOUT CONTRAST TECHNIQUE: Multidetector CT imaging of the abdomen and pelvis was performed following the standard protocol without IV contrast. COMPARISON:  09/07/2007 FINDINGS: Lower chest: Subsegmental atelectasis at the lung bases.  Hepatobiliary: Unremarkable Pancreas: A few punctate calcifications are present in the tail and uncinate process. These are nonspecific. No mass or ductal dilatation. No evidence of inflammation. Spleen: Unremarkable Adrenals/Urinary Tract: Adrenal glands are within normal limits. There is no evidence of urinary calculus. There is no definitive evidence of hydronephrosis. Several pelvic cysts are present. Simple cyst in the mid left kidney. No evidence of hydroureter. Bladder is within normal limits. Stomach/Bowel: Sigmoid diverticulosis is present without acute diverticulitis. No obvious mass in the colon. Normal appendix. No evidence of small-bowel obstruction. Stomach is decompressed. Vascular/Lymphatic: Atherosclerotic changes of the aorta and iliac arteries with calcification. No abnormal retroperitoneal adenopathy. Reproductive: Prostate is within normal limits. Other: No evidence of free fluid. Musculoskeletal: Stable mild compression deformity of T10. No new compression fracture. IMPRESSION: No evidence of urinary obstruction or urinary calculus. Sigmoid diverticulosis. Electronically Signed   By: Jolaine Click M.D.   On: 02/26/2018 09:37    Procedures Procedures (including critical care time)  Medications Ordered in ED Medications  0.9 %  sodium chloride infusion ( Intravenous New Bag/Given 02/26/18 1112)  cefTRIAXone (ROCEPHIN) 1 g in sodium chloride 0.9 % 100 mL IVPB (1 g Intravenous New Bag/Given 02/26/18 1112)  sodium chloride 0.9 % bolus 500 mL (500 mLs Intravenous New Bag/Given 02/26/18 1111)  carvedilol (COREG) tablet 12.5 mg (12.5 mg Oral Given 02/26/18 1139)     Initial Impression / Assessment and Plan / ED Course  I have reviewed the triage vital signs and the nursing notes.  Pertinent labs & imaging results that were available during my care of the patient were reviewed by me and considered in my medical decision making (see chart for details).     Patient is an elderly gentleman  presenting with 2 days of flank pain that is worsening without other associated symptoms.  History of recurrent UTIs but also history of kidney stones.  Patient was given pain medication  prior to my evaluation but is now resting comfortably.  He has mild left flank pain but no abdominal pain or symptoms concerning for sepsis.  Concern for possible UTI versus kidney stone.  Lower suspicion for dissection or AAA.  Patient was recently treated for a UTI at the beginning of December but his daughter states he usually requires a double dose to get rid of it.  We will do a CT to rule out infected stone.  CBC is reassuring with normal white count, BMP with hyperglycemia of 261 but unchanged creatinine.  UA is pending.  10:48 AM Patient's UA is concerning for UTI and CT is negative for urinary obstruction or kidney stone.  Patient treated with Rocephin and will be sent home with Keflex.  Urine culture pending.  Final Clinical Impressions(s) / ED Diagnoses   Final diagnoses:  Lower urinary tract infectious disease    ED Discharge Orders    None       Gwyneth SproutPlunkett, Roseann Kees, MD 02/26/18 1218

## 2018-02-26 NOTE — ED Triage Notes (Signed)
EMS reports low back pain, IV started and 50 mcg Fentynly given, pain is actually toward left side, daughter states he has UTI's at times. 124/70-84-97% RA-18 CBG 231

## 2018-02-26 NOTE — ED Notes (Signed)
Bed: YY48 Expected date: 02/26/18 Expected time: 7:15 AM Means of arrival: Ambulance Comments: Abd pain all night

## 2018-02-28 LAB — URINE CULTURE: Culture: >=100000 — AB

## 2018-03-01 ENCOUNTER — Telehealth: Payer: Self-pay | Admitting: Emergency Medicine

## 2018-03-01 NOTE — Telephone Encounter (Signed)
Post ED Visit - Positive Culture Follow-up  Culture report reviewed by antimicrobial stewardship pharmacist:  []  Enzo Bi, Pharm.D. []  Celedonio Miyamoto, Pharm.D., BCPS AQ-ID []  Garvin Fila, Pharm.D., BCPS []  Georgina Pillion, [address].D., BCPS []  [place], [address].D., BCPS, AAHIVP []  Estella Husk, Pharm.D., BCPS, AAHIVP []  Lysle Pearl, PharmD, BCPS []  Phillips Climes, PharmD, BCPS []  Agapito Games, PharmD, BCPS []  Verlan Friends, PharmD Ewing Schlein PharmD  Positive urine culture Treated with cephalexin, organism sensitive to the same and no further patient follow-up is required at this time.  Berle Mull 03/01/2018, 8:47 AM

## 2018-05-20 ENCOUNTER — Other Ambulatory Visit: Payer: Self-pay

## 2018-05-20 ENCOUNTER — Ambulatory Visit: Payer: Medicare Other | Admitting: Podiatry

## 2018-05-20 ENCOUNTER — Encounter: Payer: Self-pay | Admitting: Podiatry

## 2018-05-20 VITALS — Temp 97.8°F

## 2018-05-20 DIAGNOSIS — M79675 Pain in left toe(s): Secondary | ICD-10-CM | POA: Diagnosis not present

## 2018-05-20 DIAGNOSIS — B351 Tinea unguium: Secondary | ICD-10-CM | POA: Diagnosis not present

## 2018-05-20 DIAGNOSIS — M79674 Pain in right toe(s): Secondary | ICD-10-CM | POA: Diagnosis not present

## 2018-05-23 ENCOUNTER — Encounter: Payer: Self-pay | Admitting: Podiatry

## 2018-05-23 NOTE — Progress Notes (Addendum)
Subjective: Howard Shepard presents today with painful, thick toenails 1-5 b/l that he cannot cut and which interfere with daily activities.  Pain is aggravated when wearing enclosed shoe gear.  Daughter states yesterday, Dad's feet looked purple and were swollen. He complained of pain. Today, he has no problems. He denies any pain in feet or calves.  Dr. Selena Batten is his PCP.                                                                             He is on blood thinner, Plavix.   Current Outpatient Medications:  .  acetaminophen (TYLENOL) 325 MG tablet, Take 2 tablets (650 mg total) by mouth every 6 (six) hours as needed for mild pain (or Fever >/= 101)., Disp: , Rfl:  .  carvedilol (COREG) 12.5 MG tablet, Take 12.5 mg by mouth 2 (two) times daily with a meal., Disp: , Rfl:  .  clopidogrel (PLAVIX) 75 MG tablet, Take 75 mg by mouth daily., Disp: , Rfl:  .  Cyanocobalamin (VITAMIN B 12 PO), Take 1 tablet by mouth daily., Disp: , Rfl:  .  donepezil (ARICEPT) 10 MG tablet, Take 10 mg by mouth at bedtime. , Disp: , Rfl:  .  folic acid (FOLVITE) 800 MCG tablet, Take 800 mcg by mouth daily., Disp: , Rfl:  .  glipiZIDE (GLUCOTROL) 10 MG tablet, Take 10 mg by mouth 2 (two) times daily before a meal. , Disp: , Rfl:  .  Magnesium 250 MG TABS, Take 250 mg by mouth daily., Disp: , Rfl:  .  methotrexate (RHEUMATREX) 2.5 MG tablet, Take 7.5 mg by mouth See admin instructions. Take 7.5mg  by mouth Thursday morning and 7.5mg  by mouth Thursday evening, Disp: , Rfl:  .  Omega-3 Fatty Acids (FISH OIL PO), Take 1 capsule by mouth 2 (two) times daily., Disp: , Rfl:  .  omeprazole (PRILOSEC) 20 MG capsule, Take 20 mg by mouth daily., Disp: , Rfl:  .  simvastatin (ZOCOR) 10 MG tablet, Take 5 mg by mouth daily at 6 PM., Disp: , Rfl:  .  Tamsulosin HCl (FLOMAX) 0.4 MG CAPS, Take 0.4 mg by mouth daily after supper., Disp: , Rfl:   No Known Allergies  Objective:  Vascular Examination: Capillary refill time  immediate x 10 digits  Dorsalis pedis and Posterior tibial pulses palpable b/l  Digital hair present x 10 digits  Skin temperature gradient WNL b/l  No pain on calf compression b/l LE.  Mild varicosities.  Dermatological Examination: Skin with normal turgor, texture and tone b/l.  Toenails 1-5 b/l discolored, thick, dystrophic with subungual debris and pain with palpation to nailbeds due to thickness of nails.  Musculoskeletal: Muscle strength 5/5 to all LE muscle groups  No pain, crepitus or joint limitation noted with ROM.   Neurological: Sensation intact with 10 gram monofilament.  Vibratory sensation intact.  Assessment: Painful onychomycosis toenails 1-5 b/l   Plan: 1. Toenails 1-5 b/l were debrided in length and girth without iatrogenic bleeding. 2. Regarding yesterday's episode, I do not see any acute issues on today. Discussed blood thinners with daughter. Educated on signs/symptoms of DVT. Discussed use of compression hose for edema management. She related understanding  and will call should it occur again. 3. Patient to continue soft, supportive shoe gear daily. 4. Patient to report any pedal injuries to medical professional immediately. 5. Follow up 3 months.  6. Patient/POA to call should there be a concern in the interim.

## 2018-08-17 DIAGNOSIS — R413 Other amnesia: Secondary | ICD-10-CM | POA: Diagnosis not present

## 2018-08-17 DIAGNOSIS — R5383 Other fatigue: Secondary | ICD-10-CM | POA: Diagnosis not present

## 2018-08-17 DIAGNOSIS — E119 Type 2 diabetes mellitus without complications: Secondary | ICD-10-CM | POA: Diagnosis not present

## 2018-08-17 DIAGNOSIS — E7800 Pure hypercholesterolemia, unspecified: Secondary | ICD-10-CM | POA: Diagnosis not present

## 2018-08-17 DIAGNOSIS — Z0000 Encounter for general adult medical examination without abnormal findings: Secondary | ICD-10-CM | POA: Diagnosis not present

## 2018-08-17 DIAGNOSIS — I10 Essential (primary) hypertension: Secondary | ICD-10-CM | POA: Diagnosis not present

## 2018-08-17 DIAGNOSIS — I429 Cardiomyopathy, unspecified: Secondary | ICD-10-CM | POA: Diagnosis not present

## 2018-08-17 DIAGNOSIS — K219 Gastro-esophageal reflux disease without esophagitis: Secondary | ICD-10-CM | POA: Diagnosis not present

## 2018-08-19 ENCOUNTER — Ambulatory Visit: Payer: Medicare Other | Admitting: Podiatry

## 2018-08-22 DIAGNOSIS — E1140 Type 2 diabetes mellitus with diabetic neuropathy, unspecified: Secondary | ICD-10-CM | POA: Diagnosis not present

## 2018-08-23 DIAGNOSIS — Z8673 Personal history of transient ischemic attack (TIA), and cerebral infarction without residual deficits: Secondary | ICD-10-CM | POA: Diagnosis not present

## 2018-08-23 DIAGNOSIS — I429 Cardiomyopathy, unspecified: Secondary | ICD-10-CM | POA: Diagnosis not present

## 2018-08-23 DIAGNOSIS — M069 Rheumatoid arthritis, unspecified: Secondary | ICD-10-CM | POA: Diagnosis not present

## 2018-08-23 DIAGNOSIS — K219 Gastro-esophageal reflux disease without esophagitis: Secondary | ICD-10-CM | POA: Diagnosis not present

## 2018-08-23 DIAGNOSIS — E7800 Pure hypercholesterolemia, unspecified: Secondary | ICD-10-CM | POA: Diagnosis not present

## 2018-08-23 DIAGNOSIS — I10 Essential (primary) hypertension: Secondary | ICD-10-CM | POA: Diagnosis not present

## 2018-08-23 DIAGNOSIS — E119 Type 2 diabetes mellitus without complications: Secondary | ICD-10-CM | POA: Diagnosis not present

## 2018-08-23 DIAGNOSIS — G4733 Obstructive sleep apnea (adult) (pediatric): Secondary | ICD-10-CM | POA: Diagnosis not present

## 2018-08-23 DIAGNOSIS — M545 Low back pain: Secondary | ICD-10-CM | POA: Diagnosis not present

## 2018-08-23 DIAGNOSIS — G4700 Insomnia, unspecified: Secondary | ICD-10-CM | POA: Diagnosis not present

## 2018-08-25 DIAGNOSIS — M069 Rheumatoid arthritis, unspecified: Secondary | ICD-10-CM | POA: Diagnosis not present

## 2018-08-25 DIAGNOSIS — Z8673 Personal history of transient ischemic attack (TIA), and cerebral infarction without residual deficits: Secondary | ICD-10-CM | POA: Diagnosis not present

## 2018-08-25 DIAGNOSIS — M545 Low back pain: Secondary | ICD-10-CM | POA: Diagnosis not present

## 2018-08-25 DIAGNOSIS — E119 Type 2 diabetes mellitus without complications: Secondary | ICD-10-CM | POA: Diagnosis not present

## 2018-08-25 DIAGNOSIS — G4700 Insomnia, unspecified: Secondary | ICD-10-CM | POA: Diagnosis not present

## 2018-08-25 DIAGNOSIS — E7800 Pure hypercholesterolemia, unspecified: Secondary | ICD-10-CM | POA: Diagnosis not present

## 2018-08-25 DIAGNOSIS — G4733 Obstructive sleep apnea (adult) (pediatric): Secondary | ICD-10-CM | POA: Diagnosis not present

## 2018-08-25 DIAGNOSIS — I10 Essential (primary) hypertension: Secondary | ICD-10-CM | POA: Diagnosis not present

## 2018-08-25 DIAGNOSIS — K219 Gastro-esophageal reflux disease without esophagitis: Secondary | ICD-10-CM | POA: Diagnosis not present

## 2018-08-25 DIAGNOSIS — I429 Cardiomyopathy, unspecified: Secondary | ICD-10-CM | POA: Diagnosis not present

## 2018-08-29 DIAGNOSIS — K219 Gastro-esophageal reflux disease without esophagitis: Secondary | ICD-10-CM | POA: Diagnosis not present

## 2018-08-29 DIAGNOSIS — M545 Low back pain: Secondary | ICD-10-CM | POA: Diagnosis not present

## 2018-08-29 DIAGNOSIS — G4700 Insomnia, unspecified: Secondary | ICD-10-CM | POA: Diagnosis not present

## 2018-08-29 DIAGNOSIS — M069 Rheumatoid arthritis, unspecified: Secondary | ICD-10-CM | POA: Diagnosis not present

## 2018-08-29 DIAGNOSIS — I429 Cardiomyopathy, unspecified: Secondary | ICD-10-CM | POA: Diagnosis not present

## 2018-08-29 DIAGNOSIS — E7800 Pure hypercholesterolemia, unspecified: Secondary | ICD-10-CM | POA: Diagnosis not present

## 2018-08-29 DIAGNOSIS — G4733 Obstructive sleep apnea (adult) (pediatric): Secondary | ICD-10-CM | POA: Diagnosis not present

## 2018-08-29 DIAGNOSIS — E119 Type 2 diabetes mellitus without complications: Secondary | ICD-10-CM | POA: Diagnosis not present

## 2018-08-29 DIAGNOSIS — Z8673 Personal history of transient ischemic attack (TIA), and cerebral infarction without residual deficits: Secondary | ICD-10-CM | POA: Diagnosis not present

## 2018-08-29 DIAGNOSIS — I10 Essential (primary) hypertension: Secondary | ICD-10-CM | POA: Diagnosis not present

## 2018-08-31 DIAGNOSIS — I429 Cardiomyopathy, unspecified: Secondary | ICD-10-CM | POA: Diagnosis not present

## 2018-08-31 DIAGNOSIS — E119 Type 2 diabetes mellitus without complications: Secondary | ICD-10-CM | POA: Diagnosis not present

## 2018-08-31 DIAGNOSIS — I10 Essential (primary) hypertension: Secondary | ICD-10-CM | POA: Diagnosis not present

## 2018-08-31 DIAGNOSIS — Z8673 Personal history of transient ischemic attack (TIA), and cerebral infarction without residual deficits: Secondary | ICD-10-CM | POA: Diagnosis not present

## 2018-08-31 DIAGNOSIS — E7800 Pure hypercholesterolemia, unspecified: Secondary | ICD-10-CM | POA: Diagnosis not present

## 2018-08-31 DIAGNOSIS — K219 Gastro-esophageal reflux disease without esophagitis: Secondary | ICD-10-CM | POA: Diagnosis not present

## 2018-08-31 DIAGNOSIS — G4733 Obstructive sleep apnea (adult) (pediatric): Secondary | ICD-10-CM | POA: Diagnosis not present

## 2018-08-31 DIAGNOSIS — M545 Low back pain: Secondary | ICD-10-CM | POA: Diagnosis not present

## 2018-08-31 DIAGNOSIS — G4700 Insomnia, unspecified: Secondary | ICD-10-CM | POA: Diagnosis not present

## 2018-08-31 DIAGNOSIS — M069 Rheumatoid arthritis, unspecified: Secondary | ICD-10-CM | POA: Diagnosis not present

## 2018-09-07 DIAGNOSIS — E7800 Pure hypercholesterolemia, unspecified: Secondary | ICD-10-CM | POA: Diagnosis not present

## 2018-09-07 DIAGNOSIS — I10 Essential (primary) hypertension: Secondary | ICD-10-CM | POA: Diagnosis not present

## 2018-09-07 DIAGNOSIS — G4733 Obstructive sleep apnea (adult) (pediatric): Secondary | ICD-10-CM | POA: Diagnosis not present

## 2018-09-07 DIAGNOSIS — K219 Gastro-esophageal reflux disease without esophagitis: Secondary | ICD-10-CM | POA: Diagnosis not present

## 2018-09-07 DIAGNOSIS — M069 Rheumatoid arthritis, unspecified: Secondary | ICD-10-CM | POA: Diagnosis not present

## 2018-09-07 DIAGNOSIS — E119 Type 2 diabetes mellitus without complications: Secondary | ICD-10-CM | POA: Diagnosis not present

## 2018-09-07 DIAGNOSIS — M545 Low back pain: Secondary | ICD-10-CM | POA: Diagnosis not present

## 2018-09-07 DIAGNOSIS — G4700 Insomnia, unspecified: Secondary | ICD-10-CM | POA: Diagnosis not present

## 2018-09-07 DIAGNOSIS — Z8673 Personal history of transient ischemic attack (TIA), and cerebral infarction without residual deficits: Secondary | ICD-10-CM | POA: Diagnosis not present

## 2018-09-07 DIAGNOSIS — I429 Cardiomyopathy, unspecified: Secondary | ICD-10-CM | POA: Diagnosis not present

## 2018-09-15 DIAGNOSIS — G4733 Obstructive sleep apnea (adult) (pediatric): Secondary | ICD-10-CM | POA: Diagnosis not present

## 2018-09-15 DIAGNOSIS — M069 Rheumatoid arthritis, unspecified: Secondary | ICD-10-CM | POA: Diagnosis not present

## 2018-09-15 DIAGNOSIS — Z8673 Personal history of transient ischemic attack (TIA), and cerebral infarction without residual deficits: Secondary | ICD-10-CM | POA: Diagnosis not present

## 2018-09-15 DIAGNOSIS — E7800 Pure hypercholesterolemia, unspecified: Secondary | ICD-10-CM | POA: Diagnosis not present

## 2018-09-15 DIAGNOSIS — G4700 Insomnia, unspecified: Secondary | ICD-10-CM | POA: Diagnosis not present

## 2018-09-15 DIAGNOSIS — M545 Low back pain: Secondary | ICD-10-CM | POA: Diagnosis not present

## 2018-09-15 DIAGNOSIS — I429 Cardiomyopathy, unspecified: Secondary | ICD-10-CM | POA: Diagnosis not present

## 2018-09-15 DIAGNOSIS — E119 Type 2 diabetes mellitus without complications: Secondary | ICD-10-CM | POA: Diagnosis not present

## 2018-09-15 DIAGNOSIS — K219 Gastro-esophageal reflux disease without esophagitis: Secondary | ICD-10-CM | POA: Diagnosis not present

## 2018-09-15 DIAGNOSIS — I10 Essential (primary) hypertension: Secondary | ICD-10-CM | POA: Diagnosis not present

## 2018-10-03 DIAGNOSIS — Z8673 Personal history of transient ischemic attack (TIA), and cerebral infarction without residual deficits: Secondary | ICD-10-CM | POA: Diagnosis not present

## 2018-10-03 DIAGNOSIS — K219 Gastro-esophageal reflux disease without esophagitis: Secondary | ICD-10-CM | POA: Diagnosis not present

## 2018-10-03 DIAGNOSIS — M545 Low back pain: Secondary | ICD-10-CM | POA: Diagnosis not present

## 2018-10-03 DIAGNOSIS — I429 Cardiomyopathy, unspecified: Secondary | ICD-10-CM | POA: Diagnosis not present

## 2018-10-03 DIAGNOSIS — M069 Rheumatoid arthritis, unspecified: Secondary | ICD-10-CM | POA: Diagnosis not present

## 2018-10-03 DIAGNOSIS — I10 Essential (primary) hypertension: Secondary | ICD-10-CM | POA: Diagnosis not present

## 2018-10-03 DIAGNOSIS — E7800 Pure hypercholesterolemia, unspecified: Secondary | ICD-10-CM | POA: Diagnosis not present

## 2018-10-03 DIAGNOSIS — G4733 Obstructive sleep apnea (adult) (pediatric): Secondary | ICD-10-CM | POA: Diagnosis not present

## 2018-10-03 DIAGNOSIS — E119 Type 2 diabetes mellitus without complications: Secondary | ICD-10-CM | POA: Diagnosis not present

## 2018-10-03 DIAGNOSIS — G4700 Insomnia, unspecified: Secondary | ICD-10-CM | POA: Diagnosis not present

## 2018-10-13 DIAGNOSIS — Z7952 Long term (current) use of systemic steroids: Secondary | ICD-10-CM | POA: Diagnosis not present

## 2018-10-13 DIAGNOSIS — G4733 Obstructive sleep apnea (adult) (pediatric): Secondary | ICD-10-CM | POA: Diagnosis not present

## 2018-10-13 DIAGNOSIS — Z9181 History of falling: Secondary | ICD-10-CM | POA: Diagnosis not present

## 2018-10-13 DIAGNOSIS — E119 Type 2 diabetes mellitus without complications: Secondary | ICD-10-CM | POA: Diagnosis not present

## 2018-10-13 DIAGNOSIS — M069 Rheumatoid arthritis, unspecified: Secondary | ICD-10-CM | POA: Diagnosis not present

## 2018-10-13 DIAGNOSIS — K219 Gastro-esophageal reflux disease without esophagitis: Secondary | ICD-10-CM | POA: Diagnosis not present

## 2018-10-13 DIAGNOSIS — E7800 Pure hypercholesterolemia, unspecified: Secondary | ICD-10-CM | POA: Diagnosis not present

## 2018-10-13 DIAGNOSIS — H9110 Presbycusis, unspecified ear: Secondary | ICD-10-CM | POA: Diagnosis not present

## 2018-10-13 DIAGNOSIS — M545 Low back pain: Secondary | ICD-10-CM | POA: Diagnosis not present

## 2018-10-13 DIAGNOSIS — Z87891 Personal history of nicotine dependence: Secondary | ICD-10-CM | POA: Diagnosis not present

## 2018-10-13 DIAGNOSIS — J309 Allergic rhinitis, unspecified: Secondary | ICD-10-CM | POA: Diagnosis not present

## 2018-10-13 DIAGNOSIS — K5790 Diverticulosis of intestine, part unspecified, without perforation or abscess without bleeding: Secondary | ICD-10-CM | POA: Diagnosis not present

## 2018-10-13 DIAGNOSIS — I428 Other cardiomyopathies: Secondary | ICD-10-CM | POA: Diagnosis not present

## 2018-10-13 DIAGNOSIS — Z8673 Personal history of transient ischemic attack (TIA), and cerebral infarction without residual deficits: Secondary | ICD-10-CM | POA: Diagnosis not present

## 2018-10-13 DIAGNOSIS — I5030 Unspecified diastolic (congestive) heart failure: Secondary | ICD-10-CM | POA: Diagnosis not present

## 2018-10-13 DIAGNOSIS — I110 Hypertensive heart disease with heart failure: Secondary | ICD-10-CM | POA: Diagnosis not present

## 2018-10-13 DIAGNOSIS — G4700 Insomnia, unspecified: Secondary | ICD-10-CM | POA: Diagnosis not present

## 2018-10-18 DIAGNOSIS — K219 Gastro-esophageal reflux disease without esophagitis: Secondary | ICD-10-CM | POA: Diagnosis not present

## 2018-10-18 DIAGNOSIS — G4700 Insomnia, unspecified: Secondary | ICD-10-CM | POA: Diagnosis not present

## 2018-10-18 DIAGNOSIS — Z87891 Personal history of nicotine dependence: Secondary | ICD-10-CM | POA: Diagnosis not present

## 2018-10-18 DIAGNOSIS — M545 Low back pain: Secondary | ICD-10-CM | POA: Diagnosis not present

## 2018-10-18 DIAGNOSIS — E119 Type 2 diabetes mellitus without complications: Secondary | ICD-10-CM | POA: Diagnosis not present

## 2018-10-18 DIAGNOSIS — I110 Hypertensive heart disease with heart failure: Secondary | ICD-10-CM | POA: Diagnosis not present

## 2018-10-18 DIAGNOSIS — I428 Other cardiomyopathies: Secondary | ICD-10-CM | POA: Diagnosis not present

## 2018-10-18 DIAGNOSIS — G4733 Obstructive sleep apnea (adult) (pediatric): Secondary | ICD-10-CM | POA: Diagnosis not present

## 2018-10-18 DIAGNOSIS — M069 Rheumatoid arthritis, unspecified: Secondary | ICD-10-CM | POA: Diagnosis not present

## 2018-10-18 DIAGNOSIS — J309 Allergic rhinitis, unspecified: Secondary | ICD-10-CM | POA: Diagnosis not present

## 2018-10-18 DIAGNOSIS — Z8673 Personal history of transient ischemic attack (TIA), and cerebral infarction without residual deficits: Secondary | ICD-10-CM | POA: Diagnosis not present

## 2018-10-18 DIAGNOSIS — H9110 Presbycusis, unspecified ear: Secondary | ICD-10-CM | POA: Diagnosis not present

## 2018-10-18 DIAGNOSIS — K5790 Diverticulosis of intestine, part unspecified, without perforation or abscess without bleeding: Secondary | ICD-10-CM | POA: Diagnosis not present

## 2018-10-18 DIAGNOSIS — Z9181 History of falling: Secondary | ICD-10-CM | POA: Diagnosis not present

## 2018-10-18 DIAGNOSIS — I5030 Unspecified diastolic (congestive) heart failure: Secondary | ICD-10-CM | POA: Diagnosis not present

## 2018-10-18 DIAGNOSIS — Z7952 Long term (current) use of systemic steroids: Secondary | ICD-10-CM | POA: Diagnosis not present

## 2018-10-18 DIAGNOSIS — E7800 Pure hypercholesterolemia, unspecified: Secondary | ICD-10-CM | POA: Diagnosis not present

## 2018-10-24 DIAGNOSIS — H9110 Presbycusis, unspecified ear: Secondary | ICD-10-CM | POA: Diagnosis not present

## 2018-10-24 DIAGNOSIS — Z7952 Long term (current) use of systemic steroids: Secondary | ICD-10-CM | POA: Diagnosis not present

## 2018-10-24 DIAGNOSIS — G4733 Obstructive sleep apnea (adult) (pediatric): Secondary | ICD-10-CM | POA: Diagnosis not present

## 2018-10-24 DIAGNOSIS — Z87891 Personal history of nicotine dependence: Secondary | ICD-10-CM | POA: Diagnosis not present

## 2018-10-24 DIAGNOSIS — K5790 Diverticulosis of intestine, part unspecified, without perforation or abscess without bleeding: Secondary | ICD-10-CM | POA: Diagnosis not present

## 2018-10-24 DIAGNOSIS — Z9181 History of falling: Secondary | ICD-10-CM | POA: Diagnosis not present

## 2018-10-24 DIAGNOSIS — I110 Hypertensive heart disease with heart failure: Secondary | ICD-10-CM | POA: Diagnosis not present

## 2018-10-24 DIAGNOSIS — Z8673 Personal history of transient ischemic attack (TIA), and cerebral infarction without residual deficits: Secondary | ICD-10-CM | POA: Diagnosis not present

## 2018-10-24 DIAGNOSIS — I428 Other cardiomyopathies: Secondary | ICD-10-CM | POA: Diagnosis not present

## 2018-10-24 DIAGNOSIS — M069 Rheumatoid arthritis, unspecified: Secondary | ICD-10-CM | POA: Diagnosis not present

## 2018-10-24 DIAGNOSIS — E7800 Pure hypercholesterolemia, unspecified: Secondary | ICD-10-CM | POA: Diagnosis not present

## 2018-10-24 DIAGNOSIS — E119 Type 2 diabetes mellitus without complications: Secondary | ICD-10-CM | POA: Diagnosis not present

## 2018-10-24 DIAGNOSIS — J309 Allergic rhinitis, unspecified: Secondary | ICD-10-CM | POA: Diagnosis not present

## 2018-10-24 DIAGNOSIS — M545 Low back pain: Secondary | ICD-10-CM | POA: Diagnosis not present

## 2018-10-24 DIAGNOSIS — K219 Gastro-esophageal reflux disease without esophagitis: Secondary | ICD-10-CM | POA: Diagnosis not present

## 2018-10-24 DIAGNOSIS — I5030 Unspecified diastolic (congestive) heart failure: Secondary | ICD-10-CM | POA: Diagnosis not present

## 2018-10-24 DIAGNOSIS — G4700 Insomnia, unspecified: Secondary | ICD-10-CM | POA: Diagnosis not present

## 2018-10-31 DIAGNOSIS — K5790 Diverticulosis of intestine, part unspecified, without perforation or abscess without bleeding: Secondary | ICD-10-CM | POA: Diagnosis not present

## 2018-10-31 DIAGNOSIS — E119 Type 2 diabetes mellitus without complications: Secondary | ICD-10-CM | POA: Diagnosis not present

## 2018-10-31 DIAGNOSIS — J309 Allergic rhinitis, unspecified: Secondary | ICD-10-CM | POA: Diagnosis not present

## 2018-10-31 DIAGNOSIS — G4700 Insomnia, unspecified: Secondary | ICD-10-CM | POA: Diagnosis not present

## 2018-10-31 DIAGNOSIS — G4733 Obstructive sleep apnea (adult) (pediatric): Secondary | ICD-10-CM | POA: Diagnosis not present

## 2018-10-31 DIAGNOSIS — E7800 Pure hypercholesterolemia, unspecified: Secondary | ICD-10-CM | POA: Diagnosis not present

## 2018-10-31 DIAGNOSIS — Z8673 Personal history of transient ischemic attack (TIA), and cerebral infarction without residual deficits: Secondary | ICD-10-CM | POA: Diagnosis not present

## 2018-10-31 DIAGNOSIS — H9110 Presbycusis, unspecified ear: Secondary | ICD-10-CM | POA: Diagnosis not present

## 2018-10-31 DIAGNOSIS — I5030 Unspecified diastolic (congestive) heart failure: Secondary | ICD-10-CM | POA: Diagnosis not present

## 2018-10-31 DIAGNOSIS — Z7952 Long term (current) use of systemic steroids: Secondary | ICD-10-CM | POA: Diagnosis not present

## 2018-10-31 DIAGNOSIS — K219 Gastro-esophageal reflux disease without esophagitis: Secondary | ICD-10-CM | POA: Diagnosis not present

## 2018-10-31 DIAGNOSIS — I110 Hypertensive heart disease with heart failure: Secondary | ICD-10-CM | POA: Diagnosis not present

## 2018-10-31 DIAGNOSIS — M545 Low back pain: Secondary | ICD-10-CM | POA: Diagnosis not present

## 2018-10-31 DIAGNOSIS — Z87891 Personal history of nicotine dependence: Secondary | ICD-10-CM | POA: Diagnosis not present

## 2018-10-31 DIAGNOSIS — M069 Rheumatoid arthritis, unspecified: Secondary | ICD-10-CM | POA: Diagnosis not present

## 2018-10-31 DIAGNOSIS — Z9181 History of falling: Secondary | ICD-10-CM | POA: Diagnosis not present

## 2018-10-31 DIAGNOSIS — I428 Other cardiomyopathies: Secondary | ICD-10-CM | POA: Diagnosis not present

## 2018-11-07 DIAGNOSIS — J309 Allergic rhinitis, unspecified: Secondary | ICD-10-CM | POA: Diagnosis not present

## 2018-11-07 DIAGNOSIS — G4733 Obstructive sleep apnea (adult) (pediatric): Secondary | ICD-10-CM | POA: Diagnosis not present

## 2018-11-07 DIAGNOSIS — M069 Rheumatoid arthritis, unspecified: Secondary | ICD-10-CM | POA: Diagnosis not present

## 2018-11-07 DIAGNOSIS — I110 Hypertensive heart disease with heart failure: Secondary | ICD-10-CM | POA: Diagnosis not present

## 2018-11-07 DIAGNOSIS — Z87891 Personal history of nicotine dependence: Secondary | ICD-10-CM | POA: Diagnosis not present

## 2018-11-07 DIAGNOSIS — Z7952 Long term (current) use of systemic steroids: Secondary | ICD-10-CM | POA: Diagnosis not present

## 2018-11-07 DIAGNOSIS — I428 Other cardiomyopathies: Secondary | ICD-10-CM | POA: Diagnosis not present

## 2018-11-07 DIAGNOSIS — I5030 Unspecified diastolic (congestive) heart failure: Secondary | ICD-10-CM | POA: Diagnosis not present

## 2018-11-07 DIAGNOSIS — K5790 Diverticulosis of intestine, part unspecified, without perforation or abscess without bleeding: Secondary | ICD-10-CM | POA: Diagnosis not present

## 2018-11-07 DIAGNOSIS — E7800 Pure hypercholesterolemia, unspecified: Secondary | ICD-10-CM | POA: Diagnosis not present

## 2018-11-07 DIAGNOSIS — M545 Low back pain: Secondary | ICD-10-CM | POA: Diagnosis not present

## 2018-11-07 DIAGNOSIS — Z8673 Personal history of transient ischemic attack (TIA), and cerebral infarction without residual deficits: Secondary | ICD-10-CM | POA: Diagnosis not present

## 2018-11-07 DIAGNOSIS — E119 Type 2 diabetes mellitus without complications: Secondary | ICD-10-CM | POA: Diagnosis not present

## 2018-11-07 DIAGNOSIS — K219 Gastro-esophageal reflux disease without esophagitis: Secondary | ICD-10-CM | POA: Diagnosis not present

## 2018-11-07 DIAGNOSIS — G4700 Insomnia, unspecified: Secondary | ICD-10-CM | POA: Diagnosis not present

## 2018-11-07 DIAGNOSIS — Z9181 History of falling: Secondary | ICD-10-CM | POA: Diagnosis not present

## 2018-11-07 DIAGNOSIS — H9110 Presbycusis, unspecified ear: Secondary | ICD-10-CM | POA: Diagnosis not present

## 2018-11-14 DIAGNOSIS — E119 Type 2 diabetes mellitus without complications: Secondary | ICD-10-CM | POA: Diagnosis not present

## 2018-11-14 DIAGNOSIS — I428 Other cardiomyopathies: Secondary | ICD-10-CM | POA: Diagnosis not present

## 2018-11-14 DIAGNOSIS — G4733 Obstructive sleep apnea (adult) (pediatric): Secondary | ICD-10-CM | POA: Diagnosis not present

## 2018-11-14 DIAGNOSIS — K5790 Diverticulosis of intestine, part unspecified, without perforation or abscess without bleeding: Secondary | ICD-10-CM | POA: Diagnosis not present

## 2018-11-14 DIAGNOSIS — Z87891 Personal history of nicotine dependence: Secondary | ICD-10-CM | POA: Diagnosis not present

## 2018-11-14 DIAGNOSIS — M069 Rheumatoid arthritis, unspecified: Secondary | ICD-10-CM | POA: Diagnosis not present

## 2018-11-14 DIAGNOSIS — E7800 Pure hypercholesterolemia, unspecified: Secondary | ICD-10-CM | POA: Diagnosis not present

## 2018-11-14 DIAGNOSIS — K219 Gastro-esophageal reflux disease without esophagitis: Secondary | ICD-10-CM | POA: Diagnosis not present

## 2018-11-14 DIAGNOSIS — J309 Allergic rhinitis, unspecified: Secondary | ICD-10-CM | POA: Diagnosis not present

## 2018-11-14 DIAGNOSIS — Z9181 History of falling: Secondary | ICD-10-CM | POA: Diagnosis not present

## 2018-11-14 DIAGNOSIS — G4700 Insomnia, unspecified: Secondary | ICD-10-CM | POA: Diagnosis not present

## 2018-11-14 DIAGNOSIS — H9110 Presbycusis, unspecified ear: Secondary | ICD-10-CM | POA: Diagnosis not present

## 2018-11-14 DIAGNOSIS — I110 Hypertensive heart disease with heart failure: Secondary | ICD-10-CM | POA: Diagnosis not present

## 2018-11-14 DIAGNOSIS — I5030 Unspecified diastolic (congestive) heart failure: Secondary | ICD-10-CM | POA: Diagnosis not present

## 2018-11-14 DIAGNOSIS — Z7952 Long term (current) use of systemic steroids: Secondary | ICD-10-CM | POA: Diagnosis not present

## 2018-11-14 DIAGNOSIS — M545 Low back pain: Secondary | ICD-10-CM | POA: Diagnosis not present

## 2018-11-14 DIAGNOSIS — Z8673 Personal history of transient ischemic attack (TIA), and cerebral infarction without residual deficits: Secondary | ICD-10-CM | POA: Diagnosis not present

## 2018-11-21 DIAGNOSIS — K5790 Diverticulosis of intestine, part unspecified, without perforation or abscess without bleeding: Secondary | ICD-10-CM | POA: Diagnosis not present

## 2018-11-21 DIAGNOSIS — Z8673 Personal history of transient ischemic attack (TIA), and cerebral infarction without residual deficits: Secondary | ICD-10-CM | POA: Diagnosis not present

## 2018-11-21 DIAGNOSIS — E7800 Pure hypercholesterolemia, unspecified: Secondary | ICD-10-CM | POA: Diagnosis not present

## 2018-11-21 DIAGNOSIS — G4733 Obstructive sleep apnea (adult) (pediatric): Secondary | ICD-10-CM | POA: Diagnosis not present

## 2018-11-21 DIAGNOSIS — Z87891 Personal history of nicotine dependence: Secondary | ICD-10-CM | POA: Diagnosis not present

## 2018-11-21 DIAGNOSIS — E119 Type 2 diabetes mellitus without complications: Secondary | ICD-10-CM | POA: Diagnosis not present

## 2018-11-21 DIAGNOSIS — M545 Low back pain: Secondary | ICD-10-CM | POA: Diagnosis not present

## 2018-11-21 DIAGNOSIS — J309 Allergic rhinitis, unspecified: Secondary | ICD-10-CM | POA: Diagnosis not present

## 2018-11-21 DIAGNOSIS — I5030 Unspecified diastolic (congestive) heart failure: Secondary | ICD-10-CM | POA: Diagnosis not present

## 2018-11-21 DIAGNOSIS — H9110 Presbycusis, unspecified ear: Secondary | ICD-10-CM | POA: Diagnosis not present

## 2018-11-21 DIAGNOSIS — I110 Hypertensive heart disease with heart failure: Secondary | ICD-10-CM | POA: Diagnosis not present

## 2018-11-21 DIAGNOSIS — Z9181 History of falling: Secondary | ICD-10-CM | POA: Diagnosis not present

## 2018-11-21 DIAGNOSIS — G4700 Insomnia, unspecified: Secondary | ICD-10-CM | POA: Diagnosis not present

## 2018-11-21 DIAGNOSIS — M069 Rheumatoid arthritis, unspecified: Secondary | ICD-10-CM | POA: Diagnosis not present

## 2018-11-21 DIAGNOSIS — I428 Other cardiomyopathies: Secondary | ICD-10-CM | POA: Diagnosis not present

## 2018-11-21 DIAGNOSIS — Z7952 Long term (current) use of systemic steroids: Secondary | ICD-10-CM | POA: Diagnosis not present

## 2018-11-21 DIAGNOSIS — K219 Gastro-esophageal reflux disease without esophagitis: Secondary | ICD-10-CM | POA: Diagnosis not present

## 2018-12-05 DIAGNOSIS — Z8673 Personal history of transient ischemic attack (TIA), and cerebral infarction without residual deficits: Secondary | ICD-10-CM | POA: Diagnosis not present

## 2018-12-05 DIAGNOSIS — I428 Other cardiomyopathies: Secondary | ICD-10-CM | POA: Diagnosis not present

## 2018-12-05 DIAGNOSIS — M069 Rheumatoid arthritis, unspecified: Secondary | ICD-10-CM | POA: Diagnosis not present

## 2018-12-05 DIAGNOSIS — G4700 Insomnia, unspecified: Secondary | ICD-10-CM | POA: Diagnosis not present

## 2018-12-05 DIAGNOSIS — J309 Allergic rhinitis, unspecified: Secondary | ICD-10-CM | POA: Diagnosis not present

## 2018-12-05 DIAGNOSIS — G4733 Obstructive sleep apnea (adult) (pediatric): Secondary | ICD-10-CM | POA: Diagnosis not present

## 2018-12-05 DIAGNOSIS — E119 Type 2 diabetes mellitus without complications: Secondary | ICD-10-CM | POA: Diagnosis not present

## 2018-12-05 DIAGNOSIS — Z87891 Personal history of nicotine dependence: Secondary | ICD-10-CM | POA: Diagnosis not present

## 2018-12-05 DIAGNOSIS — E7800 Pure hypercholesterolemia, unspecified: Secondary | ICD-10-CM | POA: Diagnosis not present

## 2018-12-05 DIAGNOSIS — Z7952 Long term (current) use of systemic steroids: Secondary | ICD-10-CM | POA: Diagnosis not present

## 2018-12-05 DIAGNOSIS — I5030 Unspecified diastolic (congestive) heart failure: Secondary | ICD-10-CM | POA: Diagnosis not present

## 2018-12-05 DIAGNOSIS — K5790 Diverticulosis of intestine, part unspecified, without perforation or abscess without bleeding: Secondary | ICD-10-CM | POA: Diagnosis not present

## 2018-12-05 DIAGNOSIS — H9110 Presbycusis, unspecified ear: Secondary | ICD-10-CM | POA: Diagnosis not present

## 2018-12-05 DIAGNOSIS — M545 Low back pain: Secondary | ICD-10-CM | POA: Diagnosis not present

## 2018-12-05 DIAGNOSIS — Z9181 History of falling: Secondary | ICD-10-CM | POA: Diagnosis not present

## 2018-12-05 DIAGNOSIS — K219 Gastro-esophageal reflux disease without esophagitis: Secondary | ICD-10-CM | POA: Diagnosis not present

## 2018-12-05 DIAGNOSIS — I110 Hypertensive heart disease with heart failure: Secondary | ICD-10-CM | POA: Diagnosis not present

## 2018-12-19 DIAGNOSIS — Z87891 Personal history of nicotine dependence: Secondary | ICD-10-CM | POA: Diagnosis not present

## 2018-12-19 DIAGNOSIS — Z8673 Personal history of transient ischemic attack (TIA), and cerebral infarction without residual deficits: Secondary | ICD-10-CM | POA: Diagnosis not present

## 2018-12-19 DIAGNOSIS — G4733 Obstructive sleep apnea (adult) (pediatric): Secondary | ICD-10-CM | POA: Diagnosis not present

## 2018-12-19 DIAGNOSIS — Z7952 Long term (current) use of systemic steroids: Secondary | ICD-10-CM | POA: Diagnosis not present

## 2018-12-19 DIAGNOSIS — G4700 Insomnia, unspecified: Secondary | ICD-10-CM | POA: Diagnosis not present

## 2018-12-19 DIAGNOSIS — Z9181 History of falling: Secondary | ICD-10-CM | POA: Diagnosis not present

## 2018-12-19 DIAGNOSIS — E119 Type 2 diabetes mellitus without complications: Secondary | ICD-10-CM | POA: Diagnosis not present

## 2018-12-19 DIAGNOSIS — H9110 Presbycusis, unspecified ear: Secondary | ICD-10-CM | POA: Diagnosis not present

## 2018-12-19 DIAGNOSIS — M069 Rheumatoid arthritis, unspecified: Secondary | ICD-10-CM | POA: Diagnosis not present

## 2018-12-19 DIAGNOSIS — I5030 Unspecified diastolic (congestive) heart failure: Secondary | ICD-10-CM | POA: Diagnosis not present

## 2018-12-19 DIAGNOSIS — K5790 Diverticulosis of intestine, part unspecified, without perforation or abscess without bleeding: Secondary | ICD-10-CM | POA: Diagnosis not present

## 2018-12-19 DIAGNOSIS — J309 Allergic rhinitis, unspecified: Secondary | ICD-10-CM | POA: Diagnosis not present

## 2018-12-19 DIAGNOSIS — I110 Hypertensive heart disease with heart failure: Secondary | ICD-10-CM | POA: Diagnosis not present

## 2018-12-19 DIAGNOSIS — I428 Other cardiomyopathies: Secondary | ICD-10-CM | POA: Diagnosis not present

## 2018-12-19 DIAGNOSIS — K219 Gastro-esophageal reflux disease without esophagitis: Secondary | ICD-10-CM | POA: Diagnosis not present

## 2018-12-19 DIAGNOSIS — M545 Low back pain: Secondary | ICD-10-CM | POA: Diagnosis not present

## 2018-12-19 DIAGNOSIS — E7800 Pure hypercholesterolemia, unspecified: Secondary | ICD-10-CM | POA: Diagnosis not present

## 2018-12-26 DIAGNOSIS — K219 Gastro-esophageal reflux disease without esophagitis: Secondary | ICD-10-CM | POA: Diagnosis not present

## 2018-12-26 DIAGNOSIS — Z7902 Long term (current) use of antithrombotics/antiplatelets: Secondary | ICD-10-CM | POA: Diagnosis not present

## 2018-12-26 DIAGNOSIS — I428 Other cardiomyopathies: Secondary | ICD-10-CM | POA: Diagnosis not present

## 2018-12-26 DIAGNOSIS — G4733 Obstructive sleep apnea (adult) (pediatric): Secondary | ICD-10-CM | POA: Diagnosis not present

## 2018-12-26 DIAGNOSIS — J309 Allergic rhinitis, unspecified: Secondary | ICD-10-CM | POA: Diagnosis not present

## 2018-12-26 DIAGNOSIS — I5030 Unspecified diastolic (congestive) heart failure: Secondary | ICD-10-CM | POA: Diagnosis not present

## 2018-12-26 DIAGNOSIS — Z8673 Personal history of transient ischemic attack (TIA), and cerebral infarction without residual deficits: Secondary | ICD-10-CM | POA: Diagnosis not present

## 2018-12-26 DIAGNOSIS — E119 Type 2 diabetes mellitus without complications: Secondary | ICD-10-CM | POA: Diagnosis not present

## 2018-12-26 DIAGNOSIS — I110 Hypertensive heart disease with heart failure: Secondary | ICD-10-CM | POA: Diagnosis not present

## 2018-12-26 DIAGNOSIS — G4700 Insomnia, unspecified: Secondary | ICD-10-CM | POA: Diagnosis not present

## 2018-12-26 DIAGNOSIS — M069 Rheumatoid arthritis, unspecified: Secondary | ICD-10-CM | POA: Diagnosis not present

## 2018-12-26 DIAGNOSIS — Z7984 Long term (current) use of oral hypoglycemic drugs: Secondary | ICD-10-CM | POA: Diagnosis not present

## 2018-12-26 DIAGNOSIS — Z7952 Long term (current) use of systemic steroids: Secondary | ICD-10-CM | POA: Diagnosis not present

## 2018-12-26 DIAGNOSIS — K5790 Diverticulosis of intestine, part unspecified, without perforation or abscess without bleeding: Secondary | ICD-10-CM | POA: Diagnosis not present

## 2018-12-26 DIAGNOSIS — E7800 Pure hypercholesterolemia, unspecified: Secondary | ICD-10-CM | POA: Diagnosis not present

## 2018-12-26 DIAGNOSIS — H9110 Presbycusis, unspecified ear: Secondary | ICD-10-CM | POA: Diagnosis not present

## 2018-12-26 DIAGNOSIS — Z87891 Personal history of nicotine dependence: Secondary | ICD-10-CM | POA: Diagnosis not present

## 2018-12-26 DIAGNOSIS — Z9181 History of falling: Secondary | ICD-10-CM | POA: Diagnosis not present

## 2018-12-27 ENCOUNTER — Ambulatory Visit: Payer: Medicare Other | Admitting: Podiatry

## 2019-01-02 DIAGNOSIS — K219 Gastro-esophageal reflux disease without esophagitis: Secondary | ICD-10-CM | POA: Diagnosis not present

## 2019-01-02 DIAGNOSIS — Z7984 Long term (current) use of oral hypoglycemic drugs: Secondary | ICD-10-CM | POA: Diagnosis not present

## 2019-01-02 DIAGNOSIS — H9110 Presbycusis, unspecified ear: Secondary | ICD-10-CM | POA: Diagnosis not present

## 2019-01-02 DIAGNOSIS — E119 Type 2 diabetes mellitus without complications: Secondary | ICD-10-CM | POA: Diagnosis not present

## 2019-01-02 DIAGNOSIS — E7800 Pure hypercholesterolemia, unspecified: Secondary | ICD-10-CM | POA: Diagnosis not present

## 2019-01-02 DIAGNOSIS — Z9181 History of falling: Secondary | ICD-10-CM | POA: Diagnosis not present

## 2019-01-02 DIAGNOSIS — K5790 Diverticulosis of intestine, part unspecified, without perforation or abscess without bleeding: Secondary | ICD-10-CM | POA: Diagnosis not present

## 2019-01-02 DIAGNOSIS — Z7902 Long term (current) use of antithrombotics/antiplatelets: Secondary | ICD-10-CM | POA: Diagnosis not present

## 2019-01-02 DIAGNOSIS — I5030 Unspecified diastolic (congestive) heart failure: Secondary | ICD-10-CM | POA: Diagnosis not present

## 2019-01-02 DIAGNOSIS — Z87891 Personal history of nicotine dependence: Secondary | ICD-10-CM | POA: Diagnosis not present

## 2019-01-02 DIAGNOSIS — I110 Hypertensive heart disease with heart failure: Secondary | ICD-10-CM | POA: Diagnosis not present

## 2019-01-02 DIAGNOSIS — Z7952 Long term (current) use of systemic steroids: Secondary | ICD-10-CM | POA: Diagnosis not present

## 2019-01-02 DIAGNOSIS — M069 Rheumatoid arthritis, unspecified: Secondary | ICD-10-CM | POA: Diagnosis not present

## 2019-01-02 DIAGNOSIS — G4700 Insomnia, unspecified: Secondary | ICD-10-CM | POA: Diagnosis not present

## 2019-01-02 DIAGNOSIS — Z8673 Personal history of transient ischemic attack (TIA), and cerebral infarction without residual deficits: Secondary | ICD-10-CM | POA: Diagnosis not present

## 2019-01-02 DIAGNOSIS — G4733 Obstructive sleep apnea (adult) (pediatric): Secondary | ICD-10-CM | POA: Diagnosis not present

## 2019-01-02 DIAGNOSIS — I428 Other cardiomyopathies: Secondary | ICD-10-CM | POA: Diagnosis not present

## 2019-01-02 DIAGNOSIS — J309 Allergic rhinitis, unspecified: Secondary | ICD-10-CM | POA: Diagnosis not present

## 2019-01-04 DIAGNOSIS — M069 Rheumatoid arthritis, unspecified: Secondary | ICD-10-CM | POA: Diagnosis not present

## 2019-01-04 DIAGNOSIS — E119 Type 2 diabetes mellitus without complications: Secondary | ICD-10-CM | POA: Diagnosis not present

## 2019-01-04 DIAGNOSIS — I110 Hypertensive heart disease with heart failure: Secondary | ICD-10-CM | POA: Diagnosis not present

## 2019-01-16 DIAGNOSIS — H9110 Presbycusis, unspecified ear: Secondary | ICD-10-CM | POA: Diagnosis not present

## 2019-01-16 DIAGNOSIS — M069 Rheumatoid arthritis, unspecified: Secondary | ICD-10-CM | POA: Diagnosis not present

## 2019-01-16 DIAGNOSIS — Z87891 Personal history of nicotine dependence: Secondary | ICD-10-CM | POA: Diagnosis not present

## 2019-01-16 DIAGNOSIS — Z7984 Long term (current) use of oral hypoglycemic drugs: Secondary | ICD-10-CM | POA: Diagnosis not present

## 2019-01-16 DIAGNOSIS — K5790 Diverticulosis of intestine, part unspecified, without perforation or abscess without bleeding: Secondary | ICD-10-CM | POA: Diagnosis not present

## 2019-01-16 DIAGNOSIS — G4733 Obstructive sleep apnea (adult) (pediatric): Secondary | ICD-10-CM | POA: Diagnosis not present

## 2019-01-16 DIAGNOSIS — E119 Type 2 diabetes mellitus without complications: Secondary | ICD-10-CM | POA: Diagnosis not present

## 2019-01-16 DIAGNOSIS — I110 Hypertensive heart disease with heart failure: Secondary | ICD-10-CM | POA: Diagnosis not present

## 2019-01-16 DIAGNOSIS — Z8673 Personal history of transient ischemic attack (TIA), and cerebral infarction without residual deficits: Secondary | ICD-10-CM | POA: Diagnosis not present

## 2019-01-16 DIAGNOSIS — E7800 Pure hypercholesterolemia, unspecified: Secondary | ICD-10-CM | POA: Diagnosis not present

## 2019-01-16 DIAGNOSIS — I428 Other cardiomyopathies: Secondary | ICD-10-CM | POA: Diagnosis not present

## 2019-01-16 DIAGNOSIS — G4700 Insomnia, unspecified: Secondary | ICD-10-CM | POA: Diagnosis not present

## 2019-01-16 DIAGNOSIS — J309 Allergic rhinitis, unspecified: Secondary | ICD-10-CM | POA: Diagnosis not present

## 2019-01-16 DIAGNOSIS — K219 Gastro-esophageal reflux disease without esophagitis: Secondary | ICD-10-CM | POA: Diagnosis not present

## 2019-01-16 DIAGNOSIS — I5030 Unspecified diastolic (congestive) heart failure: Secondary | ICD-10-CM | POA: Diagnosis not present

## 2019-01-16 DIAGNOSIS — Z9181 History of falling: Secondary | ICD-10-CM | POA: Diagnosis not present

## 2019-01-16 DIAGNOSIS — Z7902 Long term (current) use of antithrombotics/antiplatelets: Secondary | ICD-10-CM | POA: Diagnosis not present

## 2019-01-16 DIAGNOSIS — Z7952 Long term (current) use of systemic steroids: Secondary | ICD-10-CM | POA: Diagnosis not present

## 2019-01-25 DIAGNOSIS — G4700 Insomnia, unspecified: Secondary | ICD-10-CM | POA: Diagnosis not present

## 2019-01-25 DIAGNOSIS — G4733 Obstructive sleep apnea (adult) (pediatric): Secondary | ICD-10-CM | POA: Diagnosis not present

## 2019-01-25 DIAGNOSIS — I5030 Unspecified diastolic (congestive) heart failure: Secondary | ICD-10-CM | POA: Diagnosis not present

## 2019-01-25 DIAGNOSIS — H9110 Presbycusis, unspecified ear: Secondary | ICD-10-CM | POA: Diagnosis not present

## 2019-01-25 DIAGNOSIS — Z7984 Long term (current) use of oral hypoglycemic drugs: Secondary | ICD-10-CM | POA: Diagnosis not present

## 2019-01-25 DIAGNOSIS — M069 Rheumatoid arthritis, unspecified: Secondary | ICD-10-CM | POA: Diagnosis not present

## 2019-01-25 DIAGNOSIS — Z87891 Personal history of nicotine dependence: Secondary | ICD-10-CM | POA: Diagnosis not present

## 2019-01-25 DIAGNOSIS — Z8673 Personal history of transient ischemic attack (TIA), and cerebral infarction without residual deficits: Secondary | ICD-10-CM | POA: Diagnosis not present

## 2019-01-25 DIAGNOSIS — E7800 Pure hypercholesterolemia, unspecified: Secondary | ICD-10-CM | POA: Diagnosis not present

## 2019-01-25 DIAGNOSIS — K219 Gastro-esophageal reflux disease without esophagitis: Secondary | ICD-10-CM | POA: Diagnosis not present

## 2019-01-25 DIAGNOSIS — K5790 Diverticulosis of intestine, part unspecified, without perforation or abscess without bleeding: Secondary | ICD-10-CM | POA: Diagnosis not present

## 2019-01-25 DIAGNOSIS — E119 Type 2 diabetes mellitus without complications: Secondary | ICD-10-CM | POA: Diagnosis not present

## 2019-01-25 DIAGNOSIS — Z9181 History of falling: Secondary | ICD-10-CM | POA: Diagnosis not present

## 2019-01-25 DIAGNOSIS — J309 Allergic rhinitis, unspecified: Secondary | ICD-10-CM | POA: Diagnosis not present

## 2019-01-25 DIAGNOSIS — Z7902 Long term (current) use of antithrombotics/antiplatelets: Secondary | ICD-10-CM | POA: Diagnosis not present

## 2019-01-25 DIAGNOSIS — I428 Other cardiomyopathies: Secondary | ICD-10-CM | POA: Diagnosis not present

## 2019-01-25 DIAGNOSIS — I110 Hypertensive heart disease with heart failure: Secondary | ICD-10-CM | POA: Diagnosis not present

## 2019-01-25 DIAGNOSIS — Z7952 Long term (current) use of systemic steroids: Secondary | ICD-10-CM | POA: Diagnosis not present

## 2019-02-20 DIAGNOSIS — E7800 Pure hypercholesterolemia, unspecified: Secondary | ICD-10-CM | POA: Diagnosis not present

## 2019-02-20 DIAGNOSIS — G4700 Insomnia, unspecified: Secondary | ICD-10-CM | POA: Diagnosis not present

## 2019-02-20 DIAGNOSIS — K5790 Diverticulosis of intestine, part unspecified, without perforation or abscess without bleeding: Secondary | ICD-10-CM | POA: Diagnosis not present

## 2019-02-20 DIAGNOSIS — J309 Allergic rhinitis, unspecified: Secondary | ICD-10-CM | POA: Diagnosis not present

## 2019-02-20 DIAGNOSIS — Z7902 Long term (current) use of antithrombotics/antiplatelets: Secondary | ICD-10-CM | POA: Diagnosis not present

## 2019-02-20 DIAGNOSIS — I5030 Unspecified diastolic (congestive) heart failure: Secondary | ICD-10-CM | POA: Diagnosis not present

## 2019-02-20 DIAGNOSIS — Z9181 History of falling: Secondary | ICD-10-CM | POA: Diagnosis not present

## 2019-02-20 DIAGNOSIS — E119 Type 2 diabetes mellitus without complications: Secondary | ICD-10-CM | POA: Diagnosis not present

## 2019-02-20 DIAGNOSIS — Z8673 Personal history of transient ischemic attack (TIA), and cerebral infarction without residual deficits: Secondary | ICD-10-CM | POA: Diagnosis not present

## 2019-02-20 DIAGNOSIS — M069 Rheumatoid arthritis, unspecified: Secondary | ICD-10-CM | POA: Diagnosis not present

## 2019-02-20 DIAGNOSIS — H9110 Presbycusis, unspecified ear: Secondary | ICD-10-CM | POA: Diagnosis not present

## 2019-02-20 DIAGNOSIS — Z7984 Long term (current) use of oral hypoglycemic drugs: Secondary | ICD-10-CM | POA: Diagnosis not present

## 2019-02-20 DIAGNOSIS — Z87891 Personal history of nicotine dependence: Secondary | ICD-10-CM | POA: Diagnosis not present

## 2019-02-20 DIAGNOSIS — I428 Other cardiomyopathies: Secondary | ICD-10-CM | POA: Diagnosis not present

## 2019-02-20 DIAGNOSIS — J988 Other specified respiratory disorders: Secondary | ICD-10-CM | POA: Diagnosis not present

## 2019-02-20 DIAGNOSIS — G4733 Obstructive sleep apnea (adult) (pediatric): Secondary | ICD-10-CM | POA: Diagnosis not present

## 2019-02-20 DIAGNOSIS — I110 Hypertensive heart disease with heart failure: Secondary | ICD-10-CM | POA: Diagnosis not present

## 2019-02-20 DIAGNOSIS — Z7952 Long term (current) use of systemic steroids: Secondary | ICD-10-CM | POA: Diagnosis not present

## 2019-02-20 DIAGNOSIS — K219 Gastro-esophageal reflux disease without esophagitis: Secondary | ICD-10-CM | POA: Diagnosis not present

## 2019-02-23 DIAGNOSIS — E119 Type 2 diabetes mellitus without complications: Secondary | ICD-10-CM | POA: Diagnosis not present

## 2019-02-23 DIAGNOSIS — E7800 Pure hypercholesterolemia, unspecified: Secondary | ICD-10-CM | POA: Diagnosis not present

## 2019-02-23 DIAGNOSIS — Z7952 Long term (current) use of systemic steroids: Secondary | ICD-10-CM | POA: Diagnosis not present

## 2019-02-23 DIAGNOSIS — Z7984 Long term (current) use of oral hypoglycemic drugs: Secondary | ICD-10-CM | POA: Diagnosis not present

## 2019-02-23 DIAGNOSIS — I110 Hypertensive heart disease with heart failure: Secondary | ICD-10-CM | POA: Diagnosis not present

## 2019-02-23 DIAGNOSIS — K5790 Diverticulosis of intestine, part unspecified, without perforation or abscess without bleeding: Secondary | ICD-10-CM | POA: Diagnosis not present

## 2019-02-23 DIAGNOSIS — M069 Rheumatoid arthritis, unspecified: Secondary | ICD-10-CM | POA: Diagnosis not present

## 2019-02-23 DIAGNOSIS — I428 Other cardiomyopathies: Secondary | ICD-10-CM | POA: Diagnosis not present

## 2019-02-23 DIAGNOSIS — K219 Gastro-esophageal reflux disease without esophagitis: Secondary | ICD-10-CM | POA: Diagnosis not present

## 2019-02-23 DIAGNOSIS — I5030 Unspecified diastolic (congestive) heart failure: Secondary | ICD-10-CM | POA: Diagnosis not present

## 2019-02-23 DIAGNOSIS — H9110 Presbycusis, unspecified ear: Secondary | ICD-10-CM | POA: Diagnosis not present

## 2019-02-23 DIAGNOSIS — J309 Allergic rhinitis, unspecified: Secondary | ICD-10-CM | POA: Diagnosis not present

## 2019-02-23 DIAGNOSIS — Z9181 History of falling: Secondary | ICD-10-CM | POA: Diagnosis not present

## 2019-02-23 DIAGNOSIS — Z8673 Personal history of transient ischemic attack (TIA), and cerebral infarction without residual deficits: Secondary | ICD-10-CM | POA: Diagnosis not present

## 2019-02-23 DIAGNOSIS — J988 Other specified respiratory disorders: Secondary | ICD-10-CM | POA: Diagnosis not present

## 2019-02-23 DIAGNOSIS — G4700 Insomnia, unspecified: Secondary | ICD-10-CM | POA: Diagnosis not present

## 2019-02-23 DIAGNOSIS — Z87891 Personal history of nicotine dependence: Secondary | ICD-10-CM | POA: Diagnosis not present

## 2019-02-23 DIAGNOSIS — G4733 Obstructive sleep apnea (adult) (pediatric): Secondary | ICD-10-CM | POA: Diagnosis not present

## 2019-02-23 DIAGNOSIS — Z7902 Long term (current) use of antithrombotics/antiplatelets: Secondary | ICD-10-CM | POA: Diagnosis not present

## 2019-02-27 DIAGNOSIS — I5030 Unspecified diastolic (congestive) heart failure: Secondary | ICD-10-CM | POA: Diagnosis not present

## 2019-02-27 DIAGNOSIS — H9110 Presbycusis, unspecified ear: Secondary | ICD-10-CM | POA: Diagnosis not present

## 2019-02-27 DIAGNOSIS — E7800 Pure hypercholesterolemia, unspecified: Secondary | ICD-10-CM | POA: Diagnosis not present

## 2019-02-27 DIAGNOSIS — I428 Other cardiomyopathies: Secondary | ICD-10-CM | POA: Diagnosis not present

## 2019-02-27 DIAGNOSIS — I110 Hypertensive heart disease with heart failure: Secondary | ICD-10-CM | POA: Diagnosis not present

## 2019-02-27 DIAGNOSIS — M069 Rheumatoid arthritis, unspecified: Secondary | ICD-10-CM | POA: Diagnosis not present

## 2019-02-27 DIAGNOSIS — G4733 Obstructive sleep apnea (adult) (pediatric): Secondary | ICD-10-CM | POA: Diagnosis not present

## 2019-02-27 DIAGNOSIS — J309 Allergic rhinitis, unspecified: Secondary | ICD-10-CM | POA: Diagnosis not present

## 2019-02-27 DIAGNOSIS — Z7902 Long term (current) use of antithrombotics/antiplatelets: Secondary | ICD-10-CM | POA: Diagnosis not present

## 2019-02-27 DIAGNOSIS — Z87891 Personal history of nicotine dependence: Secondary | ICD-10-CM | POA: Diagnosis not present

## 2019-02-27 DIAGNOSIS — K219 Gastro-esophageal reflux disease without esophagitis: Secondary | ICD-10-CM | POA: Diagnosis not present

## 2019-02-27 DIAGNOSIS — Z8673 Personal history of transient ischemic attack (TIA), and cerebral infarction without residual deficits: Secondary | ICD-10-CM | POA: Diagnosis not present

## 2019-02-27 DIAGNOSIS — Z7984 Long term (current) use of oral hypoglycemic drugs: Secondary | ICD-10-CM | POA: Diagnosis not present

## 2019-02-27 DIAGNOSIS — J988 Other specified respiratory disorders: Secondary | ICD-10-CM | POA: Diagnosis not present

## 2019-02-27 DIAGNOSIS — E119 Type 2 diabetes mellitus without complications: Secondary | ICD-10-CM | POA: Diagnosis not present

## 2019-02-27 DIAGNOSIS — G4700 Insomnia, unspecified: Secondary | ICD-10-CM | POA: Diagnosis not present

## 2019-02-27 DIAGNOSIS — Z7952 Long term (current) use of systemic steroids: Secondary | ICD-10-CM | POA: Diagnosis not present

## 2019-02-27 DIAGNOSIS — Z9181 History of falling: Secondary | ICD-10-CM | POA: Diagnosis not present

## 2019-02-27 DIAGNOSIS — K5790 Diverticulosis of intestine, part unspecified, without perforation or abscess without bleeding: Secondary | ICD-10-CM | POA: Diagnosis not present

## 2019-03-01 DIAGNOSIS — E7800 Pure hypercholesterolemia, unspecified: Secondary | ICD-10-CM | POA: Diagnosis not present

## 2019-03-01 DIAGNOSIS — E119 Type 2 diabetes mellitus without complications: Secondary | ICD-10-CM | POA: Diagnosis not present

## 2019-03-01 DIAGNOSIS — Z87891 Personal history of nicotine dependence: Secondary | ICD-10-CM | POA: Diagnosis not present

## 2019-03-01 DIAGNOSIS — Z7952 Long term (current) use of systemic steroids: Secondary | ICD-10-CM | POA: Diagnosis not present

## 2019-03-01 DIAGNOSIS — I110 Hypertensive heart disease with heart failure: Secondary | ICD-10-CM | POA: Diagnosis not present

## 2019-03-01 DIAGNOSIS — J309 Allergic rhinitis, unspecified: Secondary | ICD-10-CM | POA: Diagnosis not present

## 2019-03-01 DIAGNOSIS — Z7902 Long term (current) use of antithrombotics/antiplatelets: Secondary | ICD-10-CM | POA: Diagnosis not present

## 2019-03-01 DIAGNOSIS — M069 Rheumatoid arthritis, unspecified: Secondary | ICD-10-CM | POA: Diagnosis not present

## 2019-03-01 DIAGNOSIS — H9110 Presbycusis, unspecified ear: Secondary | ICD-10-CM | POA: Diagnosis not present

## 2019-03-01 DIAGNOSIS — I428 Other cardiomyopathies: Secondary | ICD-10-CM | POA: Diagnosis not present

## 2019-03-01 DIAGNOSIS — Z8673 Personal history of transient ischemic attack (TIA), and cerebral infarction without residual deficits: Secondary | ICD-10-CM | POA: Diagnosis not present

## 2019-03-01 DIAGNOSIS — Z7984 Long term (current) use of oral hypoglycemic drugs: Secondary | ICD-10-CM | POA: Diagnosis not present

## 2019-03-01 DIAGNOSIS — J988 Other specified respiratory disorders: Secondary | ICD-10-CM | POA: Diagnosis not present

## 2019-03-01 DIAGNOSIS — I5030 Unspecified diastolic (congestive) heart failure: Secondary | ICD-10-CM | POA: Diagnosis not present

## 2019-03-01 DIAGNOSIS — Z9181 History of falling: Secondary | ICD-10-CM | POA: Diagnosis not present

## 2019-03-01 DIAGNOSIS — G4700 Insomnia, unspecified: Secondary | ICD-10-CM | POA: Diagnosis not present

## 2019-03-01 DIAGNOSIS — K5790 Diverticulosis of intestine, part unspecified, without perforation or abscess without bleeding: Secondary | ICD-10-CM | POA: Diagnosis not present

## 2019-03-01 DIAGNOSIS — K219 Gastro-esophageal reflux disease without esophagitis: Secondary | ICD-10-CM | POA: Diagnosis not present

## 2019-03-01 DIAGNOSIS — G4733 Obstructive sleep apnea (adult) (pediatric): Secondary | ICD-10-CM | POA: Diagnosis not present

## 2019-03-02 DIAGNOSIS — Z8673 Personal history of transient ischemic attack (TIA), and cerebral infarction without residual deficits: Secondary | ICD-10-CM | POA: Diagnosis not present

## 2019-03-02 DIAGNOSIS — J988 Other specified respiratory disorders: Secondary | ICD-10-CM | POA: Diagnosis not present

## 2019-03-02 DIAGNOSIS — I110 Hypertensive heart disease with heart failure: Secondary | ICD-10-CM | POA: Diagnosis not present

## 2019-03-02 DIAGNOSIS — K219 Gastro-esophageal reflux disease without esophagitis: Secondary | ICD-10-CM | POA: Diagnosis not present

## 2019-03-02 DIAGNOSIS — Z9181 History of falling: Secondary | ICD-10-CM | POA: Diagnosis not present

## 2019-03-02 DIAGNOSIS — Z7984 Long term (current) use of oral hypoglycemic drugs: Secondary | ICD-10-CM | POA: Diagnosis not present

## 2019-03-02 DIAGNOSIS — Z7902 Long term (current) use of antithrombotics/antiplatelets: Secondary | ICD-10-CM | POA: Diagnosis not present

## 2019-03-02 DIAGNOSIS — M069 Rheumatoid arthritis, unspecified: Secondary | ICD-10-CM | POA: Diagnosis not present

## 2019-03-02 DIAGNOSIS — Z87891 Personal history of nicotine dependence: Secondary | ICD-10-CM | POA: Diagnosis not present

## 2019-03-02 DIAGNOSIS — G4700 Insomnia, unspecified: Secondary | ICD-10-CM | POA: Diagnosis not present

## 2019-03-02 DIAGNOSIS — E7800 Pure hypercholesterolemia, unspecified: Secondary | ICD-10-CM | POA: Diagnosis not present

## 2019-03-02 DIAGNOSIS — J309 Allergic rhinitis, unspecified: Secondary | ICD-10-CM | POA: Diagnosis not present

## 2019-03-02 DIAGNOSIS — I428 Other cardiomyopathies: Secondary | ICD-10-CM | POA: Diagnosis not present

## 2019-03-02 DIAGNOSIS — E119 Type 2 diabetes mellitus without complications: Secondary | ICD-10-CM | POA: Diagnosis not present

## 2019-03-02 DIAGNOSIS — I5030 Unspecified diastolic (congestive) heart failure: Secondary | ICD-10-CM | POA: Diagnosis not present

## 2019-03-02 DIAGNOSIS — G4733 Obstructive sleep apnea (adult) (pediatric): Secondary | ICD-10-CM | POA: Diagnosis not present

## 2019-03-02 DIAGNOSIS — H9110 Presbycusis, unspecified ear: Secondary | ICD-10-CM | POA: Diagnosis not present

## 2019-03-02 DIAGNOSIS — Z7952 Long term (current) use of systemic steroids: Secondary | ICD-10-CM | POA: Diagnosis not present

## 2019-03-02 DIAGNOSIS — K5790 Diverticulosis of intestine, part unspecified, without perforation or abscess without bleeding: Secondary | ICD-10-CM | POA: Diagnosis not present

## 2019-03-06 DIAGNOSIS — Z8673 Personal history of transient ischemic attack (TIA), and cerebral infarction without residual deficits: Secondary | ICD-10-CM | POA: Diagnosis not present

## 2019-03-06 DIAGNOSIS — J988 Other specified respiratory disorders: Secondary | ICD-10-CM | POA: Diagnosis not present

## 2019-03-06 DIAGNOSIS — M069 Rheumatoid arthritis, unspecified: Secondary | ICD-10-CM | POA: Diagnosis not present

## 2019-03-06 DIAGNOSIS — K5790 Diverticulosis of intestine, part unspecified, without perforation or abscess without bleeding: Secondary | ICD-10-CM | POA: Diagnosis not present

## 2019-03-06 DIAGNOSIS — I428 Other cardiomyopathies: Secondary | ICD-10-CM | POA: Diagnosis not present

## 2019-03-06 DIAGNOSIS — G4700 Insomnia, unspecified: Secondary | ICD-10-CM | POA: Diagnosis not present

## 2019-03-06 DIAGNOSIS — E119 Type 2 diabetes mellitus without complications: Secondary | ICD-10-CM | POA: Diagnosis not present

## 2019-03-06 DIAGNOSIS — I110 Hypertensive heart disease with heart failure: Secondary | ICD-10-CM | POA: Diagnosis not present

## 2019-03-06 DIAGNOSIS — Z7984 Long term (current) use of oral hypoglycemic drugs: Secondary | ICD-10-CM | POA: Diagnosis not present

## 2019-03-06 DIAGNOSIS — I5030 Unspecified diastolic (congestive) heart failure: Secondary | ICD-10-CM | POA: Diagnosis not present

## 2019-03-06 DIAGNOSIS — G4733 Obstructive sleep apnea (adult) (pediatric): Secondary | ICD-10-CM | POA: Diagnosis not present

## 2019-03-06 DIAGNOSIS — Z87891 Personal history of nicotine dependence: Secondary | ICD-10-CM | POA: Diagnosis not present

## 2019-03-06 DIAGNOSIS — Z7952 Long term (current) use of systemic steroids: Secondary | ICD-10-CM | POA: Diagnosis not present

## 2019-03-06 DIAGNOSIS — J309 Allergic rhinitis, unspecified: Secondary | ICD-10-CM | POA: Diagnosis not present

## 2019-03-06 DIAGNOSIS — H9110 Presbycusis, unspecified ear: Secondary | ICD-10-CM | POA: Diagnosis not present

## 2019-03-06 DIAGNOSIS — E7800 Pure hypercholesterolemia, unspecified: Secondary | ICD-10-CM | POA: Diagnosis not present

## 2019-03-06 DIAGNOSIS — Z9181 History of falling: Secondary | ICD-10-CM | POA: Diagnosis not present

## 2019-03-06 DIAGNOSIS — Z7902 Long term (current) use of antithrombotics/antiplatelets: Secondary | ICD-10-CM | POA: Diagnosis not present

## 2019-03-06 DIAGNOSIS — K219 Gastro-esophageal reflux disease without esophagitis: Secondary | ICD-10-CM | POA: Diagnosis not present

## 2019-03-09 DIAGNOSIS — Z7984 Long term (current) use of oral hypoglycemic drugs: Secondary | ICD-10-CM | POA: Diagnosis not present

## 2019-03-09 DIAGNOSIS — H9110 Presbycusis, unspecified ear: Secondary | ICD-10-CM | POA: Diagnosis not present

## 2019-03-09 DIAGNOSIS — J988 Other specified respiratory disorders: Secondary | ICD-10-CM | POA: Diagnosis not present

## 2019-03-09 DIAGNOSIS — G4700 Insomnia, unspecified: Secondary | ICD-10-CM | POA: Diagnosis not present

## 2019-03-09 DIAGNOSIS — K5790 Diverticulosis of intestine, part unspecified, without perforation or abscess without bleeding: Secondary | ICD-10-CM | POA: Diagnosis not present

## 2019-03-09 DIAGNOSIS — M069 Rheumatoid arthritis, unspecified: Secondary | ICD-10-CM | POA: Diagnosis not present

## 2019-03-09 DIAGNOSIS — K219 Gastro-esophageal reflux disease without esophagitis: Secondary | ICD-10-CM | POA: Diagnosis not present

## 2019-03-09 DIAGNOSIS — G4733 Obstructive sleep apnea (adult) (pediatric): Secondary | ICD-10-CM | POA: Diagnosis not present

## 2019-03-09 DIAGNOSIS — E119 Type 2 diabetes mellitus without complications: Secondary | ICD-10-CM | POA: Diagnosis not present

## 2019-03-09 DIAGNOSIS — Z8673 Personal history of transient ischemic attack (TIA), and cerebral infarction without residual deficits: Secondary | ICD-10-CM | POA: Diagnosis not present

## 2019-03-09 DIAGNOSIS — I110 Hypertensive heart disease with heart failure: Secondary | ICD-10-CM | POA: Diagnosis not present

## 2019-03-09 DIAGNOSIS — Z7952 Long term (current) use of systemic steroids: Secondary | ICD-10-CM | POA: Diagnosis not present

## 2019-03-09 DIAGNOSIS — Z87891 Personal history of nicotine dependence: Secondary | ICD-10-CM | POA: Diagnosis not present

## 2019-03-09 DIAGNOSIS — I5030 Unspecified diastolic (congestive) heart failure: Secondary | ICD-10-CM | POA: Diagnosis not present

## 2019-03-09 DIAGNOSIS — Z9181 History of falling: Secondary | ICD-10-CM | POA: Diagnosis not present

## 2019-03-09 DIAGNOSIS — Z7902 Long term (current) use of antithrombotics/antiplatelets: Secondary | ICD-10-CM | POA: Diagnosis not present

## 2019-03-09 DIAGNOSIS — J309 Allergic rhinitis, unspecified: Secondary | ICD-10-CM | POA: Diagnosis not present

## 2019-03-09 DIAGNOSIS — I428 Other cardiomyopathies: Secondary | ICD-10-CM | POA: Diagnosis not present

## 2019-03-09 DIAGNOSIS — E7800 Pure hypercholesterolemia, unspecified: Secondary | ICD-10-CM | POA: Diagnosis not present

## 2019-03-10 DIAGNOSIS — J309 Allergic rhinitis, unspecified: Secondary | ICD-10-CM | POA: Diagnosis not present

## 2019-03-10 DIAGNOSIS — G4700 Insomnia, unspecified: Secondary | ICD-10-CM | POA: Diagnosis not present

## 2019-03-10 DIAGNOSIS — Z87891 Personal history of nicotine dependence: Secondary | ICD-10-CM | POA: Diagnosis not present

## 2019-03-10 DIAGNOSIS — Z8673 Personal history of transient ischemic attack (TIA), and cerebral infarction without residual deficits: Secondary | ICD-10-CM | POA: Diagnosis not present

## 2019-03-10 DIAGNOSIS — I428 Other cardiomyopathies: Secondary | ICD-10-CM | POA: Diagnosis not present

## 2019-03-10 DIAGNOSIS — G4733 Obstructive sleep apnea (adult) (pediatric): Secondary | ICD-10-CM | POA: Diagnosis not present

## 2019-03-10 DIAGNOSIS — Z7902 Long term (current) use of antithrombotics/antiplatelets: Secondary | ICD-10-CM | POA: Diagnosis not present

## 2019-03-10 DIAGNOSIS — M069 Rheumatoid arthritis, unspecified: Secondary | ICD-10-CM | POA: Diagnosis not present

## 2019-03-10 DIAGNOSIS — Z9181 History of falling: Secondary | ICD-10-CM | POA: Diagnosis not present

## 2019-03-10 DIAGNOSIS — I110 Hypertensive heart disease with heart failure: Secondary | ICD-10-CM | POA: Diagnosis not present

## 2019-03-10 DIAGNOSIS — K219 Gastro-esophageal reflux disease without esophagitis: Secondary | ICD-10-CM | POA: Diagnosis not present

## 2019-03-10 DIAGNOSIS — E7800 Pure hypercholesterolemia, unspecified: Secondary | ICD-10-CM | POA: Diagnosis not present

## 2019-03-10 DIAGNOSIS — Z7984 Long term (current) use of oral hypoglycemic drugs: Secondary | ICD-10-CM | POA: Diagnosis not present

## 2019-03-10 DIAGNOSIS — E119 Type 2 diabetes mellitus without complications: Secondary | ICD-10-CM | POA: Diagnosis not present

## 2019-03-10 DIAGNOSIS — K5790 Diverticulosis of intestine, part unspecified, without perforation or abscess without bleeding: Secondary | ICD-10-CM | POA: Diagnosis not present

## 2019-03-10 DIAGNOSIS — Z7952 Long term (current) use of systemic steroids: Secondary | ICD-10-CM | POA: Diagnosis not present

## 2019-03-10 DIAGNOSIS — I5030 Unspecified diastolic (congestive) heart failure: Secondary | ICD-10-CM | POA: Diagnosis not present

## 2019-03-10 DIAGNOSIS — J988 Other specified respiratory disorders: Secondary | ICD-10-CM | POA: Diagnosis not present

## 2019-03-10 DIAGNOSIS — H9110 Presbycusis, unspecified ear: Secondary | ICD-10-CM | POA: Diagnosis not present

## 2019-03-13 DIAGNOSIS — I5030 Unspecified diastolic (congestive) heart failure: Secondary | ICD-10-CM | POA: Diagnosis not present

## 2019-03-13 DIAGNOSIS — J988 Other specified respiratory disorders: Secondary | ICD-10-CM | POA: Diagnosis not present

## 2019-03-13 DIAGNOSIS — I110 Hypertensive heart disease with heart failure: Secondary | ICD-10-CM | POA: Diagnosis not present

## 2019-03-13 DIAGNOSIS — I428 Other cardiomyopathies: Secondary | ICD-10-CM | POA: Diagnosis not present

## 2019-03-13 DIAGNOSIS — K219 Gastro-esophageal reflux disease without esophagitis: Secondary | ICD-10-CM | POA: Diagnosis not present

## 2019-03-13 DIAGNOSIS — J309 Allergic rhinitis, unspecified: Secondary | ICD-10-CM | POA: Diagnosis not present

## 2019-03-13 DIAGNOSIS — Z87891 Personal history of nicotine dependence: Secondary | ICD-10-CM | POA: Diagnosis not present

## 2019-03-13 DIAGNOSIS — M069 Rheumatoid arthritis, unspecified: Secondary | ICD-10-CM | POA: Diagnosis not present

## 2019-03-13 DIAGNOSIS — Z7984 Long term (current) use of oral hypoglycemic drugs: Secondary | ICD-10-CM | POA: Diagnosis not present

## 2019-03-13 DIAGNOSIS — Z8673 Personal history of transient ischemic attack (TIA), and cerebral infarction without residual deficits: Secondary | ICD-10-CM | POA: Diagnosis not present

## 2019-03-13 DIAGNOSIS — G4700 Insomnia, unspecified: Secondary | ICD-10-CM | POA: Diagnosis not present

## 2019-03-13 DIAGNOSIS — Z9181 History of falling: Secondary | ICD-10-CM | POA: Diagnosis not present

## 2019-03-13 DIAGNOSIS — E119 Type 2 diabetes mellitus without complications: Secondary | ICD-10-CM | POA: Diagnosis not present

## 2019-03-13 DIAGNOSIS — Z7952 Long term (current) use of systemic steroids: Secondary | ICD-10-CM | POA: Diagnosis not present

## 2019-03-13 DIAGNOSIS — K5790 Diverticulosis of intestine, part unspecified, without perforation or abscess without bleeding: Secondary | ICD-10-CM | POA: Diagnosis not present

## 2019-03-13 DIAGNOSIS — E7800 Pure hypercholesterolemia, unspecified: Secondary | ICD-10-CM | POA: Diagnosis not present

## 2019-03-13 DIAGNOSIS — G4733 Obstructive sleep apnea (adult) (pediatric): Secondary | ICD-10-CM | POA: Diagnosis not present

## 2019-03-13 DIAGNOSIS — Z7902 Long term (current) use of antithrombotics/antiplatelets: Secondary | ICD-10-CM | POA: Diagnosis not present

## 2019-03-13 DIAGNOSIS — H9110 Presbycusis, unspecified ear: Secondary | ICD-10-CM | POA: Diagnosis not present

## 2019-03-14 DIAGNOSIS — I5030 Unspecified diastolic (congestive) heart failure: Secondary | ICD-10-CM | POA: Diagnosis not present

## 2019-03-14 DIAGNOSIS — Z87891 Personal history of nicotine dependence: Secondary | ICD-10-CM | POA: Diagnosis not present

## 2019-03-14 DIAGNOSIS — G4700 Insomnia, unspecified: Secondary | ICD-10-CM | POA: Diagnosis not present

## 2019-03-14 DIAGNOSIS — E7800 Pure hypercholesterolemia, unspecified: Secondary | ICD-10-CM | POA: Diagnosis not present

## 2019-03-14 DIAGNOSIS — Z7902 Long term (current) use of antithrombotics/antiplatelets: Secondary | ICD-10-CM | POA: Diagnosis not present

## 2019-03-14 DIAGNOSIS — K5790 Diverticulosis of intestine, part unspecified, without perforation or abscess without bleeding: Secondary | ICD-10-CM | POA: Diagnosis not present

## 2019-03-14 DIAGNOSIS — Z7984 Long term (current) use of oral hypoglycemic drugs: Secondary | ICD-10-CM | POA: Diagnosis not present

## 2019-03-14 DIAGNOSIS — Z8673 Personal history of transient ischemic attack (TIA), and cerebral infarction without residual deficits: Secondary | ICD-10-CM | POA: Diagnosis not present

## 2019-03-14 DIAGNOSIS — J988 Other specified respiratory disorders: Secondary | ICD-10-CM | POA: Diagnosis not present

## 2019-03-14 DIAGNOSIS — J309 Allergic rhinitis, unspecified: Secondary | ICD-10-CM | POA: Diagnosis not present

## 2019-03-14 DIAGNOSIS — K219 Gastro-esophageal reflux disease without esophagitis: Secondary | ICD-10-CM | POA: Diagnosis not present

## 2019-03-14 DIAGNOSIS — M069 Rheumatoid arthritis, unspecified: Secondary | ICD-10-CM | POA: Diagnosis not present

## 2019-03-14 DIAGNOSIS — G4733 Obstructive sleep apnea (adult) (pediatric): Secondary | ICD-10-CM | POA: Diagnosis not present

## 2019-03-14 DIAGNOSIS — I428 Other cardiomyopathies: Secondary | ICD-10-CM | POA: Diagnosis not present

## 2019-03-14 DIAGNOSIS — I110 Hypertensive heart disease with heart failure: Secondary | ICD-10-CM | POA: Diagnosis not present

## 2019-03-14 DIAGNOSIS — Z7952 Long term (current) use of systemic steroids: Secondary | ICD-10-CM | POA: Diagnosis not present

## 2019-03-14 DIAGNOSIS — E119 Type 2 diabetes mellitus without complications: Secondary | ICD-10-CM | POA: Diagnosis not present

## 2019-03-14 DIAGNOSIS — H9110 Presbycusis, unspecified ear: Secondary | ICD-10-CM | POA: Diagnosis not present

## 2019-03-14 DIAGNOSIS — Z9181 History of falling: Secondary | ICD-10-CM | POA: Diagnosis not present

## 2019-03-17 DIAGNOSIS — H9110 Presbycusis, unspecified ear: Secondary | ICD-10-CM | POA: Diagnosis not present

## 2019-03-17 DIAGNOSIS — J309 Allergic rhinitis, unspecified: Secondary | ICD-10-CM | POA: Diagnosis not present

## 2019-03-17 DIAGNOSIS — G4733 Obstructive sleep apnea (adult) (pediatric): Secondary | ICD-10-CM | POA: Diagnosis not present

## 2019-03-17 DIAGNOSIS — Z7952 Long term (current) use of systemic steroids: Secondary | ICD-10-CM | POA: Diagnosis not present

## 2019-03-17 DIAGNOSIS — M069 Rheumatoid arthritis, unspecified: Secondary | ICD-10-CM | POA: Diagnosis not present

## 2019-03-17 DIAGNOSIS — Z8673 Personal history of transient ischemic attack (TIA), and cerebral infarction without residual deficits: Secondary | ICD-10-CM | POA: Diagnosis not present

## 2019-03-17 DIAGNOSIS — E119 Type 2 diabetes mellitus without complications: Secondary | ICD-10-CM | POA: Diagnosis not present

## 2019-03-17 DIAGNOSIS — I428 Other cardiomyopathies: Secondary | ICD-10-CM | POA: Diagnosis not present

## 2019-03-17 DIAGNOSIS — I5030 Unspecified diastolic (congestive) heart failure: Secondary | ICD-10-CM | POA: Diagnosis not present

## 2019-03-17 DIAGNOSIS — J988 Other specified respiratory disorders: Secondary | ICD-10-CM | POA: Diagnosis not present

## 2019-03-17 DIAGNOSIS — K219 Gastro-esophageal reflux disease without esophagitis: Secondary | ICD-10-CM | POA: Diagnosis not present

## 2019-03-17 DIAGNOSIS — Z9181 History of falling: Secondary | ICD-10-CM | POA: Diagnosis not present

## 2019-03-17 DIAGNOSIS — Z7902 Long term (current) use of antithrombotics/antiplatelets: Secondary | ICD-10-CM | POA: Diagnosis not present

## 2019-03-17 DIAGNOSIS — Z7984 Long term (current) use of oral hypoglycemic drugs: Secondary | ICD-10-CM | POA: Diagnosis not present

## 2019-03-17 DIAGNOSIS — K5790 Diverticulosis of intestine, part unspecified, without perforation or abscess without bleeding: Secondary | ICD-10-CM | POA: Diagnosis not present

## 2019-03-17 DIAGNOSIS — E7800 Pure hypercholesterolemia, unspecified: Secondary | ICD-10-CM | POA: Diagnosis not present

## 2019-03-17 DIAGNOSIS — I110 Hypertensive heart disease with heart failure: Secondary | ICD-10-CM | POA: Diagnosis not present

## 2019-03-17 DIAGNOSIS — Z87891 Personal history of nicotine dependence: Secondary | ICD-10-CM | POA: Diagnosis not present

## 2019-03-17 DIAGNOSIS — G4700 Insomnia, unspecified: Secondary | ICD-10-CM | POA: Diagnosis not present

## 2019-03-20 DIAGNOSIS — H9110 Presbycusis, unspecified ear: Secondary | ICD-10-CM | POA: Diagnosis not present

## 2019-03-20 DIAGNOSIS — E7800 Pure hypercholesterolemia, unspecified: Secondary | ICD-10-CM | POA: Diagnosis not present

## 2019-03-20 DIAGNOSIS — J309 Allergic rhinitis, unspecified: Secondary | ICD-10-CM | POA: Diagnosis not present

## 2019-03-20 DIAGNOSIS — I110 Hypertensive heart disease with heart failure: Secondary | ICD-10-CM | POA: Diagnosis not present

## 2019-03-20 DIAGNOSIS — Z87891 Personal history of nicotine dependence: Secondary | ICD-10-CM | POA: Diagnosis not present

## 2019-03-20 DIAGNOSIS — E119 Type 2 diabetes mellitus without complications: Secondary | ICD-10-CM | POA: Diagnosis not present

## 2019-03-20 DIAGNOSIS — Z7902 Long term (current) use of antithrombotics/antiplatelets: Secondary | ICD-10-CM | POA: Diagnosis not present

## 2019-03-20 DIAGNOSIS — M069 Rheumatoid arthritis, unspecified: Secondary | ICD-10-CM | POA: Diagnosis not present

## 2019-03-20 DIAGNOSIS — I5030 Unspecified diastolic (congestive) heart failure: Secondary | ICD-10-CM | POA: Diagnosis not present

## 2019-03-20 DIAGNOSIS — G4700 Insomnia, unspecified: Secondary | ICD-10-CM | POA: Diagnosis not present

## 2019-03-20 DIAGNOSIS — Z7952 Long term (current) use of systemic steroids: Secondary | ICD-10-CM | POA: Diagnosis not present

## 2019-03-20 DIAGNOSIS — J988 Other specified respiratory disorders: Secondary | ICD-10-CM | POA: Diagnosis not present

## 2019-03-20 DIAGNOSIS — K5790 Diverticulosis of intestine, part unspecified, without perforation or abscess without bleeding: Secondary | ICD-10-CM | POA: Diagnosis not present

## 2019-03-20 DIAGNOSIS — Z8673 Personal history of transient ischemic attack (TIA), and cerebral infarction without residual deficits: Secondary | ICD-10-CM | POA: Diagnosis not present

## 2019-03-20 DIAGNOSIS — Z9181 History of falling: Secondary | ICD-10-CM | POA: Diagnosis not present

## 2019-03-20 DIAGNOSIS — K219 Gastro-esophageal reflux disease without esophagitis: Secondary | ICD-10-CM | POA: Diagnosis not present

## 2019-03-20 DIAGNOSIS — I428 Other cardiomyopathies: Secondary | ICD-10-CM | POA: Diagnosis not present

## 2019-03-20 DIAGNOSIS — Z7984 Long term (current) use of oral hypoglycemic drugs: Secondary | ICD-10-CM | POA: Diagnosis not present

## 2019-03-20 DIAGNOSIS — G4733 Obstructive sleep apnea (adult) (pediatric): Secondary | ICD-10-CM | POA: Diagnosis not present

## 2019-03-21 ENCOUNTER — Encounter: Payer: Self-pay | Admitting: Podiatry

## 2019-03-21 ENCOUNTER — Other Ambulatory Visit: Payer: Self-pay

## 2019-03-21 ENCOUNTER — Ambulatory Visit: Payer: Medicare Other | Admitting: Podiatry

## 2019-03-21 DIAGNOSIS — B351 Tinea unguium: Secondary | ICD-10-CM | POA: Diagnosis not present

## 2019-03-21 DIAGNOSIS — M79674 Pain in right toe(s): Secondary | ICD-10-CM

## 2019-03-21 DIAGNOSIS — E119 Type 2 diabetes mellitus without complications: Secondary | ICD-10-CM | POA: Diagnosis not present

## 2019-03-21 DIAGNOSIS — E1169 Type 2 diabetes mellitus with other specified complication: Secondary | ICD-10-CM

## 2019-03-21 DIAGNOSIS — M79675 Pain in left toe(s): Secondary | ICD-10-CM | POA: Diagnosis not present

## 2019-03-21 DIAGNOSIS — M2011 Hallux valgus (acquired), right foot: Secondary | ICD-10-CM | POA: Diagnosis not present

## 2019-03-21 DIAGNOSIS — M2012 Hallux valgus (acquired), left foot: Secondary | ICD-10-CM

## 2019-03-21 DIAGNOSIS — E669 Obesity, unspecified: Secondary | ICD-10-CM

## 2019-03-21 NOTE — Patient Instructions (Signed)
Diabetes Mellitus and Foot Care Foot care is an important part of your health, especially when you have diabetes. Diabetes may cause you to have problems because of poor blood flow (circulation) to your feet and legs, which can cause your skin to:  Become thinner and drier.  Break more easily.  Heal more slowly.  Peel and crack. You may also have nerve damage (neuropathy) in your legs and feet, causing decreased feeling in them. This means that you may not notice minor injuries to your feet that could lead to more serious problems. Noticing and addressing any potential problems early is the best way to prevent future foot problems. How to care for your feet Foot hygiene  Wash your feet daily with warm water and mild soap. Do not use hot water. Then, pat your feet and the areas between your toes until they are completely dry. Do not soak your feet as this can dry your skin.  Trim your toenails straight across. Do not dig under them or around the cuticle. File the edges of your nails with an emery board or nail file.  Apply a moisturizing lotion or petroleum jelly to the skin on your feet and to dry, brittle toenails. Use lotion that does not contain alcohol and is unscented. Do not apply lotion between your toes. Shoes and socks  Wear clean socks or stockings every day. Make sure they are not too tight. Do not wear knee-high stockings since they may decrease blood flow to your legs.  Wear shoes that fit properly and have enough cushioning. Always look in your shoes before you put them on to be sure there are no objects inside.  To break in new shoes, wear them for just a few hours a day. This prevents injuries on your feet. Wounds, scrapes, corns, and calluses  Check your feet daily for blisters, cuts, bruises, sores, and redness. If you cannot see the bottom of your feet, use a mirror or ask someone for help.  Do not cut corns or calluses or try to remove them with medicine.  If you  find a minor scrape, cut, or break in the skin on your feet, keep it and the skin around it clean and dry. You may clean these areas with mild soap and water. Do not clean the area with peroxide, alcohol, or iodine.  If you have a wound, scrape, corn, or callus on your foot, look at it several times a day to make sure it is healing and not infected. Check for: ? Redness, swelling, or pain. ? Fluid or blood. ? Warmth. ? Pus or a bad smell. General instructions  Do not cross your legs. This may decrease blood flow to your feet.  Do not use heating pads or hot water bottles on your feet. They may burn your skin. If you have lost feeling in your feet or legs, you may not know this is happening until it is too late.  Protect your feet from hot and cold by wearing shoes, such as at the beach or on hot pavement.  Schedule a complete foot exam at least once a year (annually) or more often if you have foot problems. If you have foot problems, report any cuts, sores, or bruises to your health care provider immediately. Contact a health care provider if:  You have a medical condition that increases your risk of infection and you have any cuts, sores, or bruises on your feet.  You have an injury that is not   healing.  You have redness on your legs or feet.  You feel burning or tingling in your legs or feet.  You have pain or cramps in your legs and feet.  Your legs or feet are numb.  Your feet always feel cold.  You have pain around a toenail. Get help right away if:  You have a wound, scrape, corn, or callus on your foot and: ? You have pain, swelling, or redness that gets worse. ? You have fluid or blood coming from the wound, scrape, corn, or callus. ? Your wound, scrape, corn, or callus feels warm to the touch. ? You have pus or a bad smell coming from the wound, scrape, corn, or callus. ? You have a fever. ? You have a red line going up your leg. Summary  Check your feet every day  for cuts, sores, red spots, swelling, and blisters.  Moisturize feet and legs daily.  Wear shoes that fit properly and have enough cushioning.  If you have foot problems, report any cuts, sores, or bruises to your health care provider immediately.  Schedule a complete foot exam at least once a year (annually) or more often if you have foot problems. This information is not intended to replace advice given to you by your health care provider. Make sure you discuss any questions you have with your health care provider. Document Revised: 11/02/2018 Document Reviewed: 03/13/2016 Elsevier Patient Education  2020 [employer]  

## 2019-03-21 NOTE — Progress Notes (Signed)
Subjective: Howard Shepard presents today for follow up of preventative diabetic foot care with and painful mycotic nails b/l that are difficult to trim. Pain interferes with ambulation. Aggravating factors include wearing enclosed shoe gear. Pain is relieved with periodic professional debridement.   His daughter is present during the visit.  He is on blood thinner, Plavix.  No Known Allergies   Objective: There were no vitals filed for this visit.  Vascular Examination:  capillary refill time to digits immediate b/l, palpable DP pulses b/l, palpable PT pulses b/l, pedal hair sparse b/l, skin temperature gradient within normal limits b/l and varicosities present b/l  Dermatological Examination: Pedal skin with normal turgor, texture and tone bilaterally, no open wounds bilaterally, no interdigital macerations bilaterally and toenails 1-5 b/l elongated, dystrophic, thickened, crumbly with subungual debris  Musculoskeletal: normal muscle strength 5/5 to all lower extremity muscle groups bilaterally, no pain crepitus or joint limitation noted with ROM b/l and bunion deformity noted b/l  Neurological: sensation intact 5/5 intact bilaterally with 10g monofilament b/l and vibratory sensation intact b/l  Assessment: 1. Pain due to onychomycosis of toenails of both feet   2. Hallux valgus, acquired, bilateral   3. Diabetes mellitus type 2 in obese (HCC)   4. Encounter for diabetic foot exam (HCC)      Plan: -Diabetic foot examination performed on today. -Continue diabetic foot care principles. Literature dispensed on today.  -Toenails 1-5 b/l were debrided in length and girth without iatrogenic bleeding. -Patient to continue soft, supportive shoe gear daily. -Patient to report any pedal injuries to medical professional immediately. -Patient/POA to call should there be question/concern in the interim.  Return in about 3 months (around 06/19/2019) for diabetic nail trim.

## 2019-03-27 DIAGNOSIS — I428 Other cardiomyopathies: Secondary | ICD-10-CM | POA: Diagnosis not present

## 2019-03-27 DIAGNOSIS — H9110 Presbycusis, unspecified ear: Secondary | ICD-10-CM | POA: Diagnosis not present

## 2019-03-27 DIAGNOSIS — M069 Rheumatoid arthritis, unspecified: Secondary | ICD-10-CM | POA: Diagnosis not present

## 2019-03-27 DIAGNOSIS — J988 Other specified respiratory disorders: Secondary | ICD-10-CM | POA: Diagnosis not present

## 2019-03-27 DIAGNOSIS — Z87891 Personal history of nicotine dependence: Secondary | ICD-10-CM | POA: Diagnosis not present

## 2019-03-27 DIAGNOSIS — Z7984 Long term (current) use of oral hypoglycemic drugs: Secondary | ICD-10-CM | POA: Diagnosis not present

## 2019-03-27 DIAGNOSIS — I110 Hypertensive heart disease with heart failure: Secondary | ICD-10-CM | POA: Diagnosis not present

## 2019-03-27 DIAGNOSIS — G4700 Insomnia, unspecified: Secondary | ICD-10-CM | POA: Diagnosis not present

## 2019-03-27 DIAGNOSIS — G4733 Obstructive sleep apnea (adult) (pediatric): Secondary | ICD-10-CM | POA: Diagnosis not present

## 2019-03-27 DIAGNOSIS — I5030 Unspecified diastolic (congestive) heart failure: Secondary | ICD-10-CM | POA: Diagnosis not present

## 2019-03-27 DIAGNOSIS — Z7952 Long term (current) use of systemic steroids: Secondary | ICD-10-CM | POA: Diagnosis not present

## 2019-03-27 DIAGNOSIS — E7800 Pure hypercholesterolemia, unspecified: Secondary | ICD-10-CM | POA: Diagnosis not present

## 2019-03-27 DIAGNOSIS — J309 Allergic rhinitis, unspecified: Secondary | ICD-10-CM | POA: Diagnosis not present

## 2019-03-27 DIAGNOSIS — Z7902 Long term (current) use of antithrombotics/antiplatelets: Secondary | ICD-10-CM | POA: Diagnosis not present

## 2019-03-27 DIAGNOSIS — K219 Gastro-esophageal reflux disease without esophagitis: Secondary | ICD-10-CM | POA: Diagnosis not present

## 2019-03-27 DIAGNOSIS — K5790 Diverticulosis of intestine, part unspecified, without perforation or abscess without bleeding: Secondary | ICD-10-CM | POA: Diagnosis not present

## 2019-03-27 DIAGNOSIS — Z8673 Personal history of transient ischemic attack (TIA), and cerebral infarction without residual deficits: Secondary | ICD-10-CM | POA: Diagnosis not present

## 2019-03-27 DIAGNOSIS — E119 Type 2 diabetes mellitus without complications: Secondary | ICD-10-CM | POA: Diagnosis not present

## 2019-03-27 DIAGNOSIS — Z9181 History of falling: Secondary | ICD-10-CM | POA: Diagnosis not present

## 2019-03-29 DIAGNOSIS — I110 Hypertensive heart disease with heart failure: Secondary | ICD-10-CM | POA: Diagnosis not present

## 2019-03-29 DIAGNOSIS — J309 Allergic rhinitis, unspecified: Secondary | ICD-10-CM | POA: Diagnosis not present

## 2019-03-29 DIAGNOSIS — K219 Gastro-esophageal reflux disease without esophagitis: Secondary | ICD-10-CM | POA: Diagnosis not present

## 2019-03-29 DIAGNOSIS — G4700 Insomnia, unspecified: Secondary | ICD-10-CM | POA: Diagnosis not present

## 2019-03-29 DIAGNOSIS — E7800 Pure hypercholesterolemia, unspecified: Secondary | ICD-10-CM | POA: Diagnosis not present

## 2019-03-29 DIAGNOSIS — I428 Other cardiomyopathies: Secondary | ICD-10-CM | POA: Diagnosis not present

## 2019-03-29 DIAGNOSIS — Z7984 Long term (current) use of oral hypoglycemic drugs: Secondary | ICD-10-CM | POA: Diagnosis not present

## 2019-03-29 DIAGNOSIS — Z9181 History of falling: Secondary | ICD-10-CM | POA: Diagnosis not present

## 2019-03-29 DIAGNOSIS — Z87891 Personal history of nicotine dependence: Secondary | ICD-10-CM | POA: Diagnosis not present

## 2019-03-29 DIAGNOSIS — Z7952 Long term (current) use of systemic steroids: Secondary | ICD-10-CM | POA: Diagnosis not present

## 2019-03-29 DIAGNOSIS — J988 Other specified respiratory disorders: Secondary | ICD-10-CM | POA: Diagnosis not present

## 2019-03-29 DIAGNOSIS — H9110 Presbycusis, unspecified ear: Secondary | ICD-10-CM | POA: Diagnosis not present

## 2019-03-29 DIAGNOSIS — I5030 Unspecified diastolic (congestive) heart failure: Secondary | ICD-10-CM | POA: Diagnosis not present

## 2019-03-29 DIAGNOSIS — E119 Type 2 diabetes mellitus without complications: Secondary | ICD-10-CM | POA: Diagnosis not present

## 2019-03-29 DIAGNOSIS — Z7902 Long term (current) use of antithrombotics/antiplatelets: Secondary | ICD-10-CM | POA: Diagnosis not present

## 2019-03-29 DIAGNOSIS — M069 Rheumatoid arthritis, unspecified: Secondary | ICD-10-CM | POA: Diagnosis not present

## 2019-03-29 DIAGNOSIS — G4733 Obstructive sleep apnea (adult) (pediatric): Secondary | ICD-10-CM | POA: Diagnosis not present

## 2019-03-29 DIAGNOSIS — K5790 Diverticulosis of intestine, part unspecified, without perforation or abscess without bleeding: Secondary | ICD-10-CM | POA: Diagnosis not present

## 2019-03-29 DIAGNOSIS — Z8673 Personal history of transient ischemic attack (TIA), and cerebral infarction without residual deficits: Secondary | ICD-10-CM | POA: Diagnosis not present

## 2019-04-05 DIAGNOSIS — G4733 Obstructive sleep apnea (adult) (pediatric): Secondary | ICD-10-CM | POA: Diagnosis not present

## 2019-04-05 DIAGNOSIS — E7800 Pure hypercholesterolemia, unspecified: Secondary | ICD-10-CM | POA: Diagnosis not present

## 2019-04-05 DIAGNOSIS — G4700 Insomnia, unspecified: Secondary | ICD-10-CM | POA: Diagnosis not present

## 2019-04-05 DIAGNOSIS — Z9181 History of falling: Secondary | ICD-10-CM | POA: Diagnosis not present

## 2019-04-05 DIAGNOSIS — Z7952 Long term (current) use of systemic steroids: Secondary | ICD-10-CM | POA: Diagnosis not present

## 2019-04-05 DIAGNOSIS — Z8673 Personal history of transient ischemic attack (TIA), and cerebral infarction without residual deficits: Secondary | ICD-10-CM | POA: Diagnosis not present

## 2019-04-05 DIAGNOSIS — H9110 Presbycusis, unspecified ear: Secondary | ICD-10-CM | POA: Diagnosis not present

## 2019-04-05 DIAGNOSIS — Z7984 Long term (current) use of oral hypoglycemic drugs: Secondary | ICD-10-CM | POA: Diagnosis not present

## 2019-04-05 DIAGNOSIS — J309 Allergic rhinitis, unspecified: Secondary | ICD-10-CM | POA: Diagnosis not present

## 2019-04-05 DIAGNOSIS — I110 Hypertensive heart disease with heart failure: Secondary | ICD-10-CM | POA: Diagnosis not present

## 2019-04-05 DIAGNOSIS — I428 Other cardiomyopathies: Secondary | ICD-10-CM | POA: Diagnosis not present

## 2019-04-05 DIAGNOSIS — Z7902 Long term (current) use of antithrombotics/antiplatelets: Secondary | ICD-10-CM | POA: Diagnosis not present

## 2019-04-05 DIAGNOSIS — I5030 Unspecified diastolic (congestive) heart failure: Secondary | ICD-10-CM | POA: Diagnosis not present

## 2019-04-05 DIAGNOSIS — K5790 Diverticulosis of intestine, part unspecified, without perforation or abscess without bleeding: Secondary | ICD-10-CM | POA: Diagnosis not present

## 2019-04-05 DIAGNOSIS — K219 Gastro-esophageal reflux disease without esophagitis: Secondary | ICD-10-CM | POA: Diagnosis not present

## 2019-04-05 DIAGNOSIS — J988 Other specified respiratory disorders: Secondary | ICD-10-CM | POA: Diagnosis not present

## 2019-04-05 DIAGNOSIS — Z87891 Personal history of nicotine dependence: Secondary | ICD-10-CM | POA: Diagnosis not present

## 2019-04-05 DIAGNOSIS — M069 Rheumatoid arthritis, unspecified: Secondary | ICD-10-CM | POA: Diagnosis not present

## 2019-04-05 DIAGNOSIS — E119 Type 2 diabetes mellitus without complications: Secondary | ICD-10-CM | POA: Diagnosis not present

## 2019-04-14 DIAGNOSIS — I110 Hypertensive heart disease with heart failure: Secondary | ICD-10-CM | POA: Diagnosis not present

## 2019-04-14 DIAGNOSIS — I428 Other cardiomyopathies: Secondary | ICD-10-CM | POA: Diagnosis not present

## 2019-04-14 DIAGNOSIS — K5790 Diverticulosis of intestine, part unspecified, without perforation or abscess without bleeding: Secondary | ICD-10-CM | POA: Diagnosis not present

## 2019-04-14 DIAGNOSIS — M069 Rheumatoid arthritis, unspecified: Secondary | ICD-10-CM | POA: Diagnosis not present

## 2019-04-14 DIAGNOSIS — I5030 Unspecified diastolic (congestive) heart failure: Secondary | ICD-10-CM | POA: Diagnosis not present

## 2019-04-14 DIAGNOSIS — J309 Allergic rhinitis, unspecified: Secondary | ICD-10-CM | POA: Diagnosis not present

## 2019-04-14 DIAGNOSIS — G4733 Obstructive sleep apnea (adult) (pediatric): Secondary | ICD-10-CM | POA: Diagnosis not present

## 2019-04-14 DIAGNOSIS — E119 Type 2 diabetes mellitus without complications: Secondary | ICD-10-CM | POA: Diagnosis not present

## 2019-04-14 DIAGNOSIS — K219 Gastro-esophageal reflux disease without esophagitis: Secondary | ICD-10-CM | POA: Diagnosis not present

## 2019-04-14 DIAGNOSIS — G4700 Insomnia, unspecified: Secondary | ICD-10-CM | POA: Diagnosis not present

## 2019-04-14 DIAGNOSIS — J988 Other specified respiratory disorders: Secondary | ICD-10-CM | POA: Diagnosis not present

## 2019-04-14 DIAGNOSIS — Z7902 Long term (current) use of antithrombotics/antiplatelets: Secondary | ICD-10-CM | POA: Diagnosis not present

## 2019-04-14 DIAGNOSIS — Z9181 History of falling: Secondary | ICD-10-CM | POA: Diagnosis not present

## 2019-04-14 DIAGNOSIS — Z87891 Personal history of nicotine dependence: Secondary | ICD-10-CM | POA: Diagnosis not present

## 2019-04-14 DIAGNOSIS — E7800 Pure hypercholesterolemia, unspecified: Secondary | ICD-10-CM | POA: Diagnosis not present

## 2019-04-14 DIAGNOSIS — Z8673 Personal history of transient ischemic attack (TIA), and cerebral infarction without residual deficits: Secondary | ICD-10-CM | POA: Diagnosis not present

## 2019-04-14 DIAGNOSIS — H9110 Presbycusis, unspecified ear: Secondary | ICD-10-CM | POA: Diagnosis not present

## 2019-04-14 DIAGNOSIS — Z7984 Long term (current) use of oral hypoglycemic drugs: Secondary | ICD-10-CM | POA: Diagnosis not present

## 2019-04-14 DIAGNOSIS — Z7952 Long term (current) use of systemic steroids: Secondary | ICD-10-CM | POA: Diagnosis not present

## 2019-04-15 DIAGNOSIS — I5030 Unspecified diastolic (congestive) heart failure: Secondary | ICD-10-CM | POA: Diagnosis not present

## 2019-04-15 DIAGNOSIS — M069 Rheumatoid arthritis, unspecified: Secondary | ICD-10-CM | POA: Diagnosis not present

## 2019-04-15 DIAGNOSIS — Z9181 History of falling: Secondary | ICD-10-CM | POA: Diagnosis not present

## 2019-04-15 DIAGNOSIS — I428 Other cardiomyopathies: Secondary | ICD-10-CM | POA: Diagnosis not present

## 2019-04-15 DIAGNOSIS — E7800 Pure hypercholesterolemia, unspecified: Secondary | ICD-10-CM | POA: Diagnosis not present

## 2019-04-15 DIAGNOSIS — Z7952 Long term (current) use of systemic steroids: Secondary | ICD-10-CM | POA: Diagnosis not present

## 2019-04-15 DIAGNOSIS — Z7984 Long term (current) use of oral hypoglycemic drugs: Secondary | ICD-10-CM | POA: Diagnosis not present

## 2019-04-15 DIAGNOSIS — Z8673 Personal history of transient ischemic attack (TIA), and cerebral infarction without residual deficits: Secondary | ICD-10-CM | POA: Diagnosis not present

## 2019-04-15 DIAGNOSIS — Z7902 Long term (current) use of antithrombotics/antiplatelets: Secondary | ICD-10-CM | POA: Diagnosis not present

## 2019-04-15 DIAGNOSIS — E119 Type 2 diabetes mellitus without complications: Secondary | ICD-10-CM | POA: Diagnosis not present

## 2019-04-15 DIAGNOSIS — H9110 Presbycusis, unspecified ear: Secondary | ICD-10-CM | POA: Diagnosis not present

## 2019-04-15 DIAGNOSIS — J988 Other specified respiratory disorders: Secondary | ICD-10-CM | POA: Diagnosis not present

## 2019-04-15 DIAGNOSIS — G4733 Obstructive sleep apnea (adult) (pediatric): Secondary | ICD-10-CM | POA: Diagnosis not present

## 2019-04-15 DIAGNOSIS — Z87891 Personal history of nicotine dependence: Secondary | ICD-10-CM | POA: Diagnosis not present

## 2019-04-15 DIAGNOSIS — G4700 Insomnia, unspecified: Secondary | ICD-10-CM | POA: Diagnosis not present

## 2019-04-15 DIAGNOSIS — I110 Hypertensive heart disease with heart failure: Secondary | ICD-10-CM | POA: Diagnosis not present

## 2019-04-15 DIAGNOSIS — K5790 Diverticulosis of intestine, part unspecified, without perforation or abscess without bleeding: Secondary | ICD-10-CM | POA: Diagnosis not present

## 2019-04-15 DIAGNOSIS — J309 Allergic rhinitis, unspecified: Secondary | ICD-10-CM | POA: Diagnosis not present

## 2019-04-15 DIAGNOSIS — K219 Gastro-esophageal reflux disease without esophagitis: Secondary | ICD-10-CM | POA: Diagnosis not present

## 2019-04-19 DIAGNOSIS — K219 Gastro-esophageal reflux disease without esophagitis: Secondary | ICD-10-CM | POA: Diagnosis not present

## 2019-04-19 DIAGNOSIS — Z7952 Long term (current) use of systemic steroids: Secondary | ICD-10-CM | POA: Diagnosis not present

## 2019-04-19 DIAGNOSIS — Z8673 Personal history of transient ischemic attack (TIA), and cerebral infarction without residual deficits: Secondary | ICD-10-CM | POA: Diagnosis not present

## 2019-04-19 DIAGNOSIS — Z87891 Personal history of nicotine dependence: Secondary | ICD-10-CM | POA: Diagnosis not present

## 2019-04-19 DIAGNOSIS — Z7902 Long term (current) use of antithrombotics/antiplatelets: Secondary | ICD-10-CM | POA: Diagnosis not present

## 2019-04-19 DIAGNOSIS — K5790 Diverticulosis of intestine, part unspecified, without perforation or abscess without bleeding: Secondary | ICD-10-CM | POA: Diagnosis not present

## 2019-04-19 DIAGNOSIS — Z7984 Long term (current) use of oral hypoglycemic drugs: Secondary | ICD-10-CM | POA: Diagnosis not present

## 2019-04-19 DIAGNOSIS — H9110 Presbycusis, unspecified ear: Secondary | ICD-10-CM | POA: Diagnosis not present

## 2019-04-19 DIAGNOSIS — E7800 Pure hypercholesterolemia, unspecified: Secondary | ICD-10-CM | POA: Diagnosis not present

## 2019-04-19 DIAGNOSIS — Z9181 History of falling: Secondary | ICD-10-CM | POA: Diagnosis not present

## 2019-04-19 DIAGNOSIS — I428 Other cardiomyopathies: Secondary | ICD-10-CM | POA: Diagnosis not present

## 2019-04-19 DIAGNOSIS — M069 Rheumatoid arthritis, unspecified: Secondary | ICD-10-CM | POA: Diagnosis not present

## 2019-04-19 DIAGNOSIS — E119 Type 2 diabetes mellitus without complications: Secondary | ICD-10-CM | POA: Diagnosis not present

## 2019-04-19 DIAGNOSIS — I5030 Unspecified diastolic (congestive) heart failure: Secondary | ICD-10-CM | POA: Diagnosis not present

## 2019-04-19 DIAGNOSIS — J309 Allergic rhinitis, unspecified: Secondary | ICD-10-CM | POA: Diagnosis not present

## 2019-04-19 DIAGNOSIS — G4700 Insomnia, unspecified: Secondary | ICD-10-CM | POA: Diagnosis not present

## 2019-04-19 DIAGNOSIS — G4733 Obstructive sleep apnea (adult) (pediatric): Secondary | ICD-10-CM | POA: Diagnosis not present

## 2019-04-19 DIAGNOSIS — J988 Other specified respiratory disorders: Secondary | ICD-10-CM | POA: Diagnosis not present

## 2019-04-19 DIAGNOSIS — I110 Hypertensive heart disease with heart failure: Secondary | ICD-10-CM | POA: Diagnosis not present

## 2019-04-28 DIAGNOSIS — M545 Low back pain: Secondary | ICD-10-CM | POA: Diagnosis not present

## 2019-04-28 DIAGNOSIS — G8929 Other chronic pain: Secondary | ICD-10-CM | POA: Diagnosis not present

## 2019-04-28 DIAGNOSIS — M069 Rheumatoid arthritis, unspecified: Secondary | ICD-10-CM | POA: Diagnosis not present

## 2019-05-01 DIAGNOSIS — Z7902 Long term (current) use of antithrombotics/antiplatelets: Secondary | ICD-10-CM | POA: Diagnosis not present

## 2019-05-01 DIAGNOSIS — Z9181 History of falling: Secondary | ICD-10-CM | POA: Diagnosis not present

## 2019-05-01 DIAGNOSIS — G4733 Obstructive sleep apnea (adult) (pediatric): Secondary | ICD-10-CM | POA: Diagnosis not present

## 2019-05-01 DIAGNOSIS — I428 Other cardiomyopathies: Secondary | ICD-10-CM | POA: Diagnosis not present

## 2019-05-01 DIAGNOSIS — Z87891 Personal history of nicotine dependence: Secondary | ICD-10-CM | POA: Diagnosis not present

## 2019-05-01 DIAGNOSIS — J309 Allergic rhinitis, unspecified: Secondary | ICD-10-CM | POA: Diagnosis not present

## 2019-05-01 DIAGNOSIS — Z7984 Long term (current) use of oral hypoglycemic drugs: Secondary | ICD-10-CM | POA: Diagnosis not present

## 2019-05-01 DIAGNOSIS — E7800 Pure hypercholesterolemia, unspecified: Secondary | ICD-10-CM | POA: Diagnosis not present

## 2019-05-01 DIAGNOSIS — G4700 Insomnia, unspecified: Secondary | ICD-10-CM | POA: Diagnosis not present

## 2019-05-01 DIAGNOSIS — K5790 Diverticulosis of intestine, part unspecified, without perforation or abscess without bleeding: Secondary | ICD-10-CM | POA: Diagnosis not present

## 2019-05-01 DIAGNOSIS — M069 Rheumatoid arthritis, unspecified: Secondary | ICD-10-CM | POA: Diagnosis not present

## 2019-05-01 DIAGNOSIS — I5030 Unspecified diastolic (congestive) heart failure: Secondary | ICD-10-CM | POA: Diagnosis not present

## 2019-05-01 DIAGNOSIS — J988 Other specified respiratory disorders: Secondary | ICD-10-CM | POA: Diagnosis not present

## 2019-05-01 DIAGNOSIS — G8929 Other chronic pain: Secondary | ICD-10-CM | POA: Diagnosis not present

## 2019-05-01 DIAGNOSIS — M545 Low back pain: Secondary | ICD-10-CM | POA: Diagnosis not present

## 2019-05-01 DIAGNOSIS — Z7952 Long term (current) use of systemic steroids: Secondary | ICD-10-CM | POA: Diagnosis not present

## 2019-05-01 DIAGNOSIS — E119 Type 2 diabetes mellitus without complications: Secondary | ICD-10-CM | POA: Diagnosis not present

## 2019-05-01 DIAGNOSIS — Z8673 Personal history of transient ischemic attack (TIA), and cerebral infarction without residual deficits: Secondary | ICD-10-CM | POA: Diagnosis not present

## 2019-05-01 DIAGNOSIS — H9110 Presbycusis, unspecified ear: Secondary | ICD-10-CM | POA: Diagnosis not present

## 2019-05-01 DIAGNOSIS — I110 Hypertensive heart disease with heart failure: Secondary | ICD-10-CM | POA: Diagnosis not present

## 2019-05-01 DIAGNOSIS — K219 Gastro-esophageal reflux disease without esophagitis: Secondary | ICD-10-CM | POA: Diagnosis not present

## 2019-05-16 DIAGNOSIS — Z7902 Long term (current) use of antithrombotics/antiplatelets: Secondary | ICD-10-CM | POA: Diagnosis not present

## 2019-05-16 DIAGNOSIS — Z87891 Personal history of nicotine dependence: Secondary | ICD-10-CM | POA: Diagnosis not present

## 2019-05-16 DIAGNOSIS — I110 Hypertensive heart disease with heart failure: Secondary | ICD-10-CM | POA: Diagnosis not present

## 2019-05-16 DIAGNOSIS — H9110 Presbycusis, unspecified ear: Secondary | ICD-10-CM | POA: Diagnosis not present

## 2019-05-16 DIAGNOSIS — K219 Gastro-esophageal reflux disease without esophagitis: Secondary | ICD-10-CM | POA: Diagnosis not present

## 2019-05-16 DIAGNOSIS — M069 Rheumatoid arthritis, unspecified: Secondary | ICD-10-CM | POA: Diagnosis not present

## 2019-05-16 DIAGNOSIS — K5790 Diverticulosis of intestine, part unspecified, without perforation or abscess without bleeding: Secondary | ICD-10-CM | POA: Diagnosis not present

## 2019-05-16 DIAGNOSIS — E7800 Pure hypercholesterolemia, unspecified: Secondary | ICD-10-CM | POA: Diagnosis not present

## 2019-05-16 DIAGNOSIS — G4733 Obstructive sleep apnea (adult) (pediatric): Secondary | ICD-10-CM | POA: Diagnosis not present

## 2019-05-16 DIAGNOSIS — Z8673 Personal history of transient ischemic attack (TIA), and cerebral infarction without residual deficits: Secondary | ICD-10-CM | POA: Diagnosis not present

## 2019-05-16 DIAGNOSIS — I428 Other cardiomyopathies: Secondary | ICD-10-CM | POA: Diagnosis not present

## 2019-05-16 DIAGNOSIS — Z9181 History of falling: Secondary | ICD-10-CM | POA: Diagnosis not present

## 2019-05-16 DIAGNOSIS — J988 Other specified respiratory disorders: Secondary | ICD-10-CM | POA: Diagnosis not present

## 2019-05-16 DIAGNOSIS — M545 Low back pain: Secondary | ICD-10-CM | POA: Diagnosis not present

## 2019-05-16 DIAGNOSIS — G4700 Insomnia, unspecified: Secondary | ICD-10-CM | POA: Diagnosis not present

## 2019-05-16 DIAGNOSIS — I5030 Unspecified diastolic (congestive) heart failure: Secondary | ICD-10-CM | POA: Diagnosis not present

## 2019-05-16 DIAGNOSIS — E119 Type 2 diabetes mellitus without complications: Secondary | ICD-10-CM | POA: Diagnosis not present

## 2019-05-16 DIAGNOSIS — Z7952 Long term (current) use of systemic steroids: Secondary | ICD-10-CM | POA: Diagnosis not present

## 2019-05-16 DIAGNOSIS — G8929 Other chronic pain: Secondary | ICD-10-CM | POA: Diagnosis not present

## 2019-05-16 DIAGNOSIS — Z7984 Long term (current) use of oral hypoglycemic drugs: Secondary | ICD-10-CM | POA: Diagnosis not present

## 2019-05-16 DIAGNOSIS — J309 Allergic rhinitis, unspecified: Secondary | ICD-10-CM | POA: Diagnosis not present

## 2019-06-02 DIAGNOSIS — J988 Other specified respiratory disorders: Secondary | ICD-10-CM | POA: Diagnosis not present

## 2019-06-02 DIAGNOSIS — E119 Type 2 diabetes mellitus without complications: Secondary | ICD-10-CM | POA: Diagnosis not present

## 2019-06-02 DIAGNOSIS — Z9181 History of falling: Secondary | ICD-10-CM | POA: Diagnosis not present

## 2019-06-02 DIAGNOSIS — G4733 Obstructive sleep apnea (adult) (pediatric): Secondary | ICD-10-CM | POA: Diagnosis not present

## 2019-06-02 DIAGNOSIS — K5790 Diverticulosis of intestine, part unspecified, without perforation or abscess without bleeding: Secondary | ICD-10-CM | POA: Diagnosis not present

## 2019-06-02 DIAGNOSIS — G4700 Insomnia, unspecified: Secondary | ICD-10-CM | POA: Diagnosis not present

## 2019-06-02 DIAGNOSIS — I428 Other cardiomyopathies: Secondary | ICD-10-CM | POA: Diagnosis not present

## 2019-06-02 DIAGNOSIS — M545 Low back pain: Secondary | ICD-10-CM | POA: Diagnosis not present

## 2019-06-02 DIAGNOSIS — Z7984 Long term (current) use of oral hypoglycemic drugs: Secondary | ICD-10-CM | POA: Diagnosis not present

## 2019-06-02 DIAGNOSIS — Z7902 Long term (current) use of antithrombotics/antiplatelets: Secondary | ICD-10-CM | POA: Diagnosis not present

## 2019-06-02 DIAGNOSIS — K219 Gastro-esophageal reflux disease without esophagitis: Secondary | ICD-10-CM | POA: Diagnosis not present

## 2019-06-02 DIAGNOSIS — I110 Hypertensive heart disease with heart failure: Secondary | ICD-10-CM | POA: Diagnosis not present

## 2019-06-02 DIAGNOSIS — Z7952 Long term (current) use of systemic steroids: Secondary | ICD-10-CM | POA: Diagnosis not present

## 2019-06-02 DIAGNOSIS — H9110 Presbycusis, unspecified ear: Secondary | ICD-10-CM | POA: Diagnosis not present

## 2019-06-02 DIAGNOSIS — E7800 Pure hypercholesterolemia, unspecified: Secondary | ICD-10-CM | POA: Diagnosis not present

## 2019-06-02 DIAGNOSIS — I5030 Unspecified diastolic (congestive) heart failure: Secondary | ICD-10-CM | POA: Diagnosis not present

## 2019-06-02 DIAGNOSIS — Z8673 Personal history of transient ischemic attack (TIA), and cerebral infarction without residual deficits: Secondary | ICD-10-CM | POA: Diagnosis not present

## 2019-06-02 DIAGNOSIS — Z87891 Personal history of nicotine dependence: Secondary | ICD-10-CM | POA: Diagnosis not present

## 2019-06-02 DIAGNOSIS — G8929 Other chronic pain: Secondary | ICD-10-CM | POA: Diagnosis not present

## 2019-06-02 DIAGNOSIS — M069 Rheumatoid arthritis, unspecified: Secondary | ICD-10-CM | POA: Diagnosis not present

## 2019-06-02 DIAGNOSIS — J309 Allergic rhinitis, unspecified: Secondary | ICD-10-CM | POA: Diagnosis not present

## 2019-06-14 DIAGNOSIS — K219 Gastro-esophageal reflux disease without esophagitis: Secondary | ICD-10-CM | POA: Diagnosis not present

## 2019-06-14 DIAGNOSIS — Z8673 Personal history of transient ischemic attack (TIA), and cerebral infarction without residual deficits: Secondary | ICD-10-CM | POA: Diagnosis not present

## 2019-06-14 DIAGNOSIS — G4700 Insomnia, unspecified: Secondary | ICD-10-CM | POA: Diagnosis not present

## 2019-06-14 DIAGNOSIS — M069 Rheumatoid arthritis, unspecified: Secondary | ICD-10-CM | POA: Diagnosis not present

## 2019-06-14 DIAGNOSIS — Z9181 History of falling: Secondary | ICD-10-CM | POA: Diagnosis not present

## 2019-06-14 DIAGNOSIS — J988 Other specified respiratory disorders: Secondary | ICD-10-CM | POA: Diagnosis not present

## 2019-06-14 DIAGNOSIS — Z7952 Long term (current) use of systemic steroids: Secondary | ICD-10-CM | POA: Diagnosis not present

## 2019-06-14 DIAGNOSIS — Z7902 Long term (current) use of antithrombotics/antiplatelets: Secondary | ICD-10-CM | POA: Diagnosis not present

## 2019-06-14 DIAGNOSIS — Z87891 Personal history of nicotine dependence: Secondary | ICD-10-CM | POA: Diagnosis not present

## 2019-06-14 DIAGNOSIS — E7800 Pure hypercholesterolemia, unspecified: Secondary | ICD-10-CM | POA: Diagnosis not present

## 2019-06-14 DIAGNOSIS — Z7984 Long term (current) use of oral hypoglycemic drugs: Secondary | ICD-10-CM | POA: Diagnosis not present

## 2019-06-14 DIAGNOSIS — I428 Other cardiomyopathies: Secondary | ICD-10-CM | POA: Diagnosis not present

## 2019-06-14 DIAGNOSIS — M545 Low back pain: Secondary | ICD-10-CM | POA: Diagnosis not present

## 2019-06-14 DIAGNOSIS — I110 Hypertensive heart disease with heart failure: Secondary | ICD-10-CM | POA: Diagnosis not present

## 2019-06-14 DIAGNOSIS — E119 Type 2 diabetes mellitus without complications: Secondary | ICD-10-CM | POA: Diagnosis not present

## 2019-06-14 DIAGNOSIS — H9110 Presbycusis, unspecified ear: Secondary | ICD-10-CM | POA: Diagnosis not present

## 2019-06-14 DIAGNOSIS — I5030 Unspecified diastolic (congestive) heart failure: Secondary | ICD-10-CM | POA: Diagnosis not present

## 2019-06-14 DIAGNOSIS — G8929 Other chronic pain: Secondary | ICD-10-CM | POA: Diagnosis not present

## 2019-06-14 DIAGNOSIS — K5790 Diverticulosis of intestine, part unspecified, without perforation or abscess without bleeding: Secondary | ICD-10-CM | POA: Diagnosis not present

## 2019-06-14 DIAGNOSIS — J309 Allergic rhinitis, unspecified: Secondary | ICD-10-CM | POA: Diagnosis not present

## 2019-06-14 DIAGNOSIS — G4733 Obstructive sleep apnea (adult) (pediatric): Secondary | ICD-10-CM | POA: Diagnosis not present

## 2019-06-27 ENCOUNTER — Ambulatory Visit: Payer: Medicare Other | Admitting: Podiatry

## 2019-07-23 IMAGING — MR MR ANKLE*L* W/O CM
4 of 5 series · 19 of 40 positions shown · non-contrast
Comparison: Radiographs dated 02/05/2017

CLINICAL DATA: Ankle pain. Erythema at the lateral aspect of the
ankle.

EXAM:
MRI OF THE LEFT ANKLE WITHOUT CONTRAST
TECHNIQUE: Multiplanar, multisequence MR imaging of the ankle was performed. No
intravenous contrast was administered.

[Series 6: T1 · sagittal · 3.0mm · 0.35mm/px · 3 of 21 slices shown]
[im 1/21]
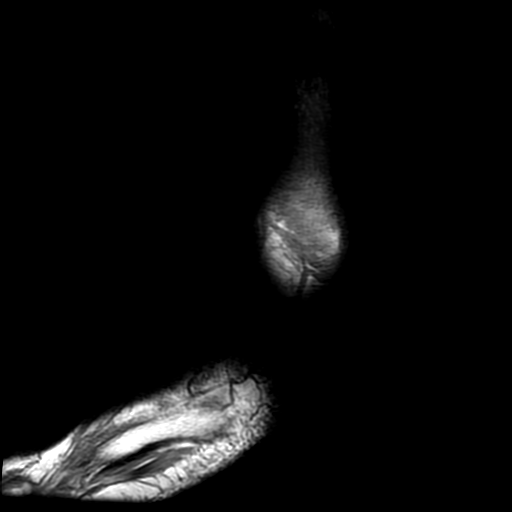
[im 11/21]
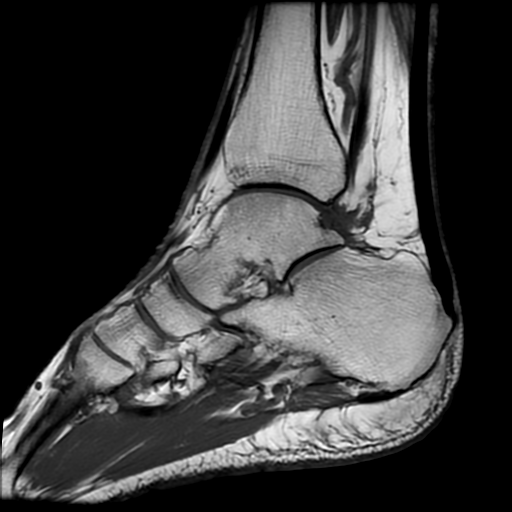
[im 21/21]
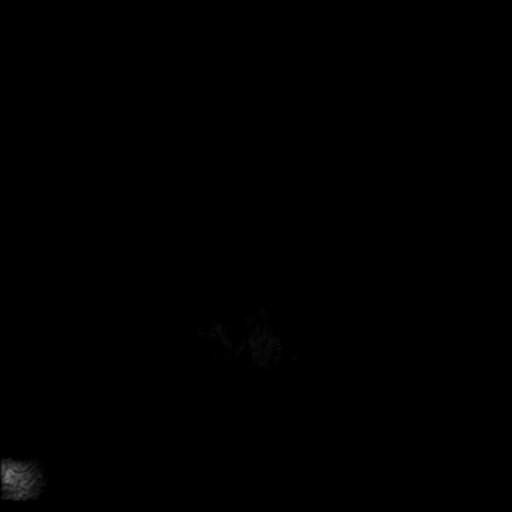

[Series 7: PD fat-sat · axial · 3.0mm · 0.33mm/px · z∈[+8,+159]mm · 10 of 49 slices shown]
[im 1/49]
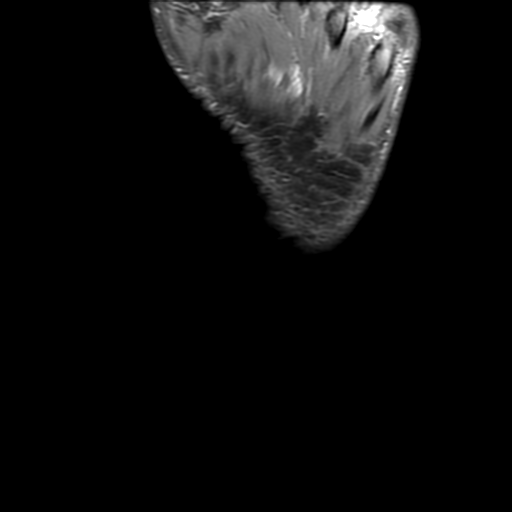
[im 5/49]
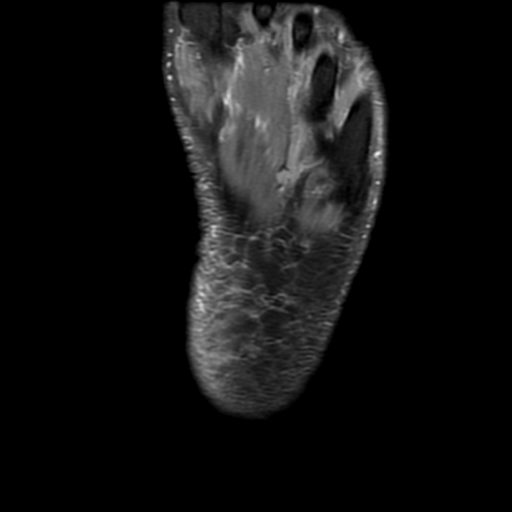
[im 10/49]
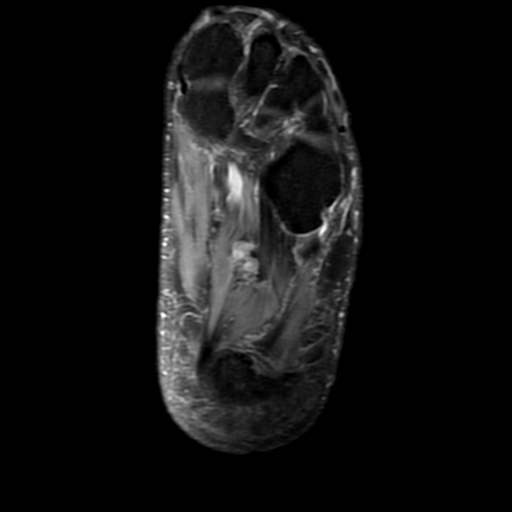
[im 15/49]
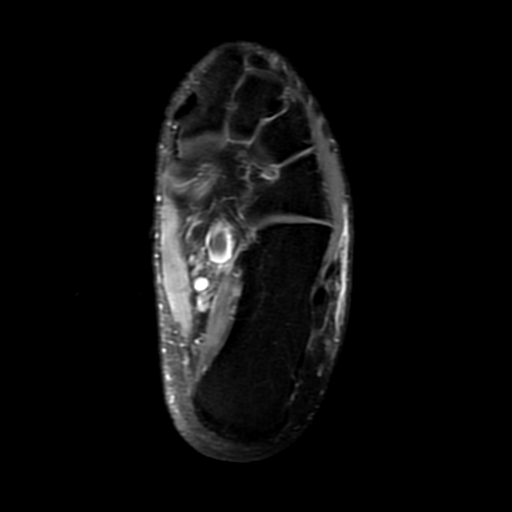
[im 20/49]
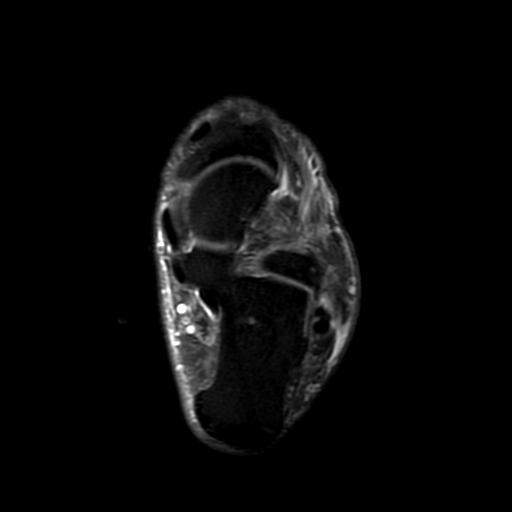
[im 25/49]
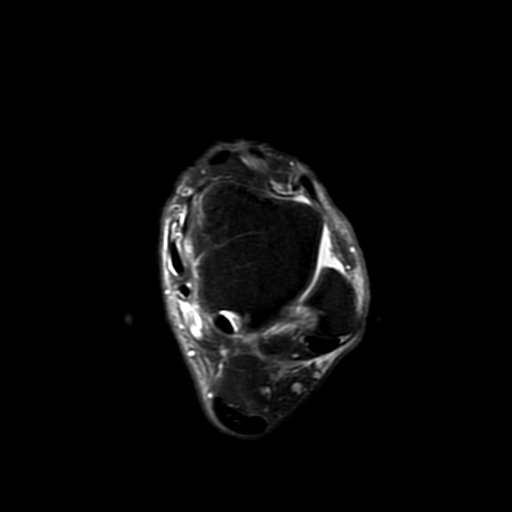
[im 29/49]
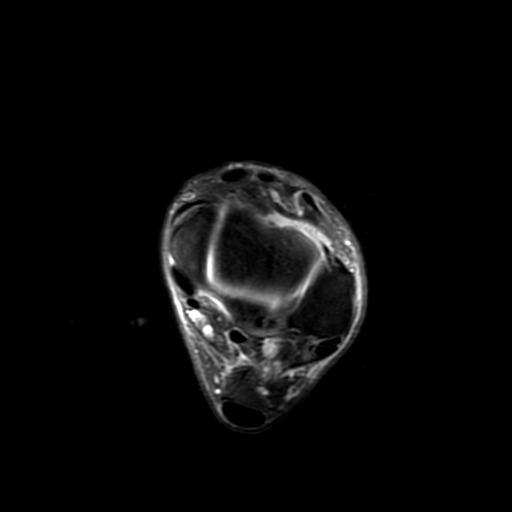
[im 34/49]
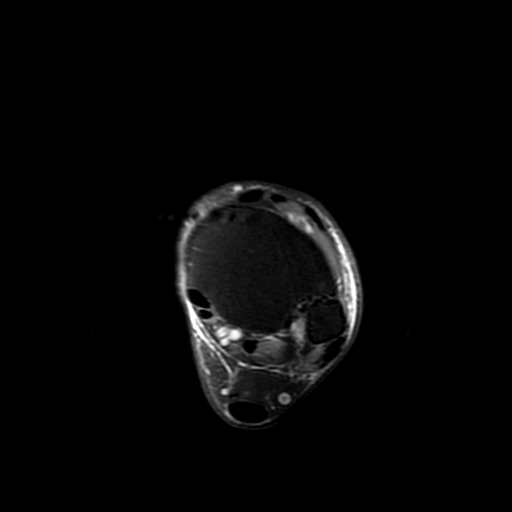
[im 39/49]
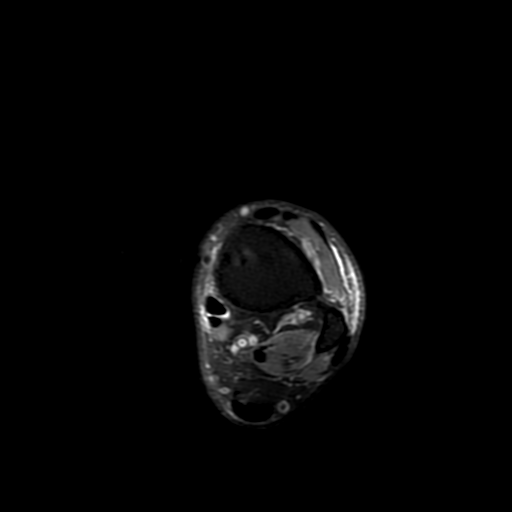
[im 44/49]
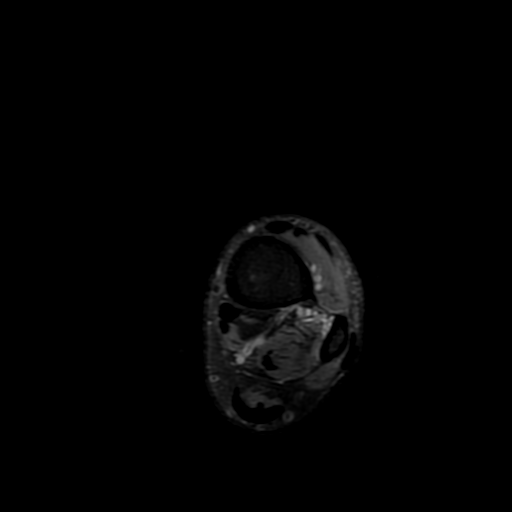

[Series 8: T2 fat-sat · axial · 3.0mm · 0.33mm/px · z∈[+22,+159]mm · 3 of 49 slices shown (1 of 2)]
[im 5/49]
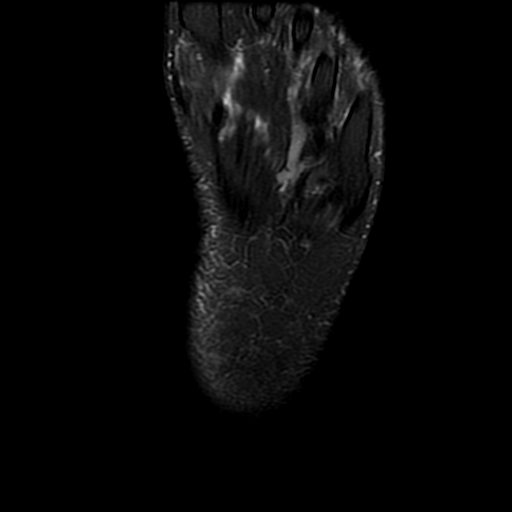
[im 25/49]
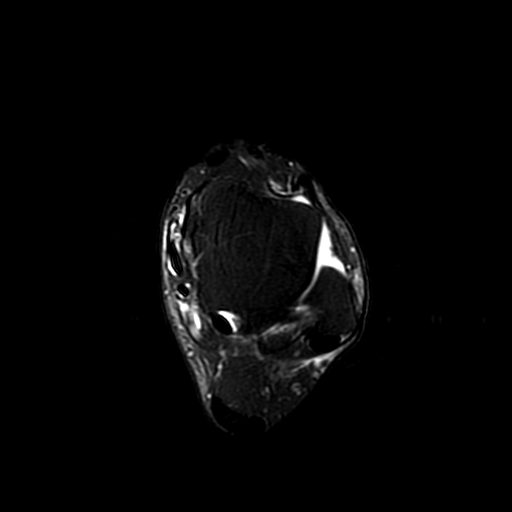
[im 44/49]
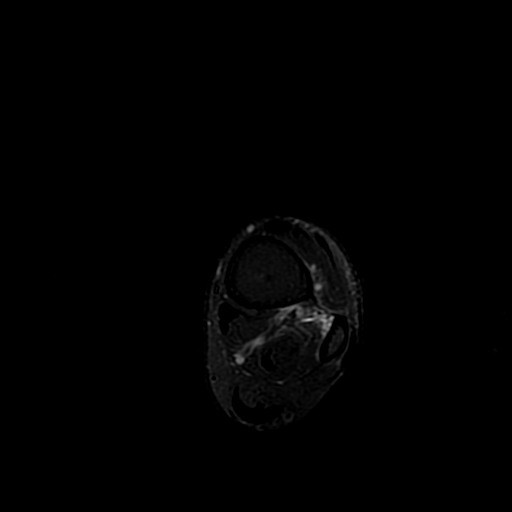

[Series 9: T2 fat-sat · coronal · 3.0mm · 0.31mm/px · 3 of 40 slices shown (2 of 2)]
[im 5/40]
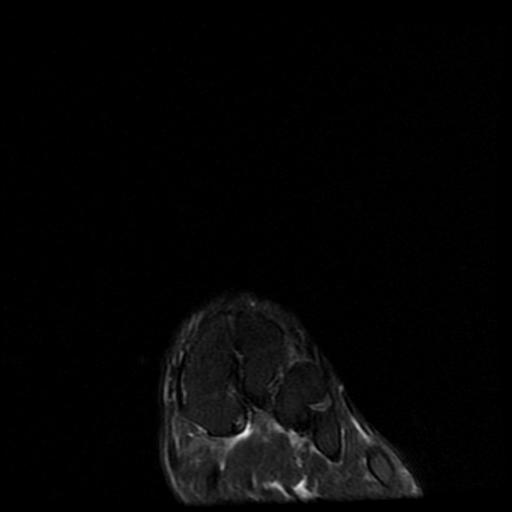
[im 20/40]
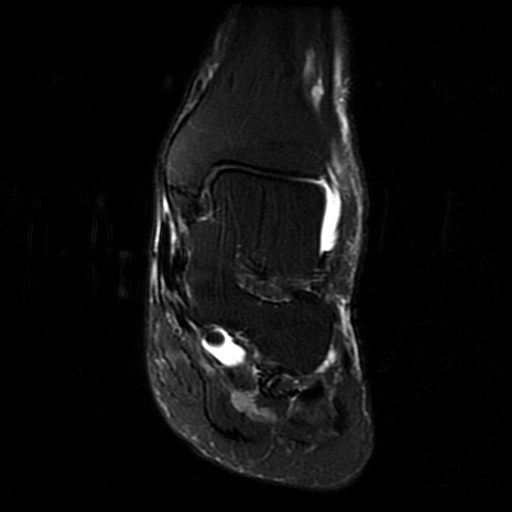
[im 35/40]
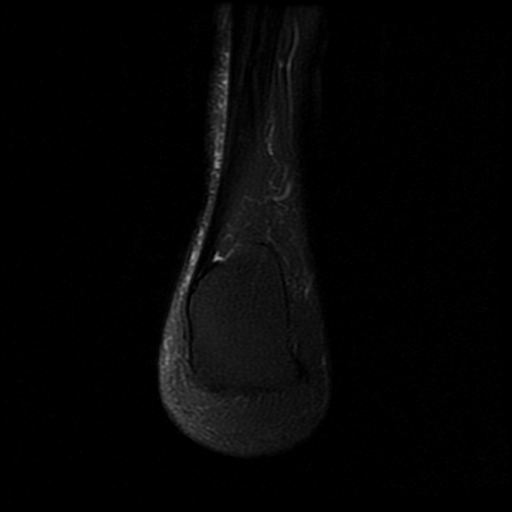

[19 of 40 positions shown; findings below may reference images not displayed]

FINDINGS: TENDONS

Peroneal: Normal.

Posteromedial: Tenosynovitis involving the flexor hallucis longus
tendon and the posterior tibialis tendon.

Anterior: Normal.

Achilles: Minimal degenerative changes of the distal Achilles
tendon. No acute inflammation.

Plantar Fascia: Normal.

LIGAMENTS

Lateral: Anterior talofibular ligament is not identified and is
probably chronically torn. Posterior talofibular ligament and
calcaneofibular ligament are intact.

Medial: Normal.

CARTILAGE

Ankle Joint: Small nonspecific ankle effusion.

Subtalar Joints/Sinus Tarsi: No joint effusion or chondral defect.
Intact spring ligament.

Bones: No significant abnormality.

Other: Slight nonspecific subcutaneous edema adjacent to the distal
fibula and extending below the tip of the lateral malleolus.
IMPRESSION: 1. No fractures or dislocation.
2. Medial tenosynovitis.
3. Probable chronic complete tear of the anterior talofibular
ligament.
4. Small nonspecific ankle joint effusion.
5. Slight nonspecific subcutaneous edema at the lateral aspect of
the ankle.

## 2019-07-23 IMAGING — DX DG ANKLE PORT 2V*L*
3 series · 3 of 3 positions shown · non-contrast
Comparison: None.

CLINICAL DATA: Ankle pain for 2 days.

EXAM:
PORTABLE LEFT ANKLE - 2 VIEW

[ankle ap]
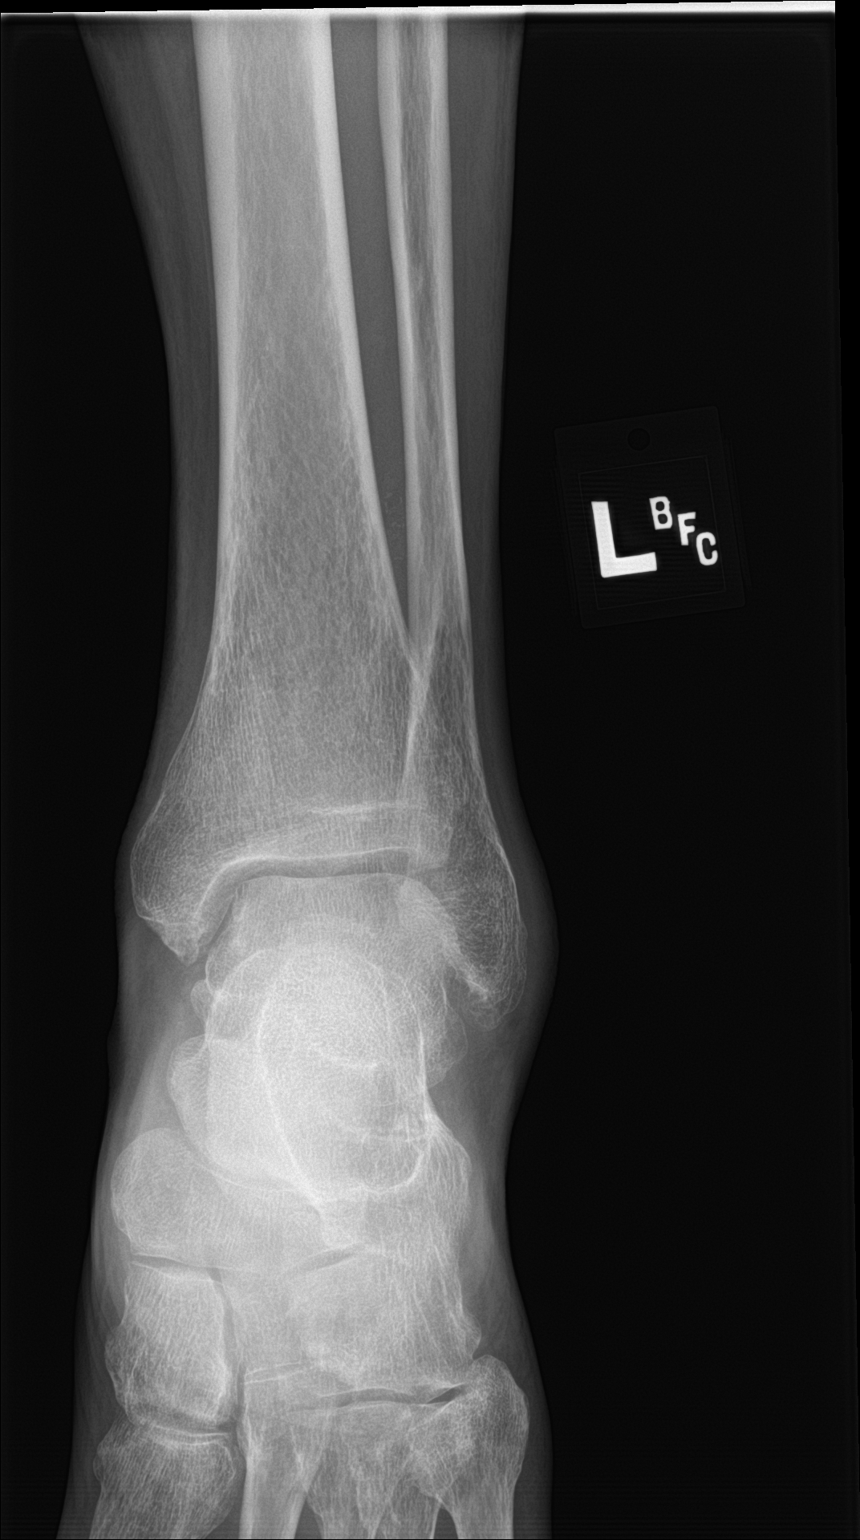

[ankle obl]
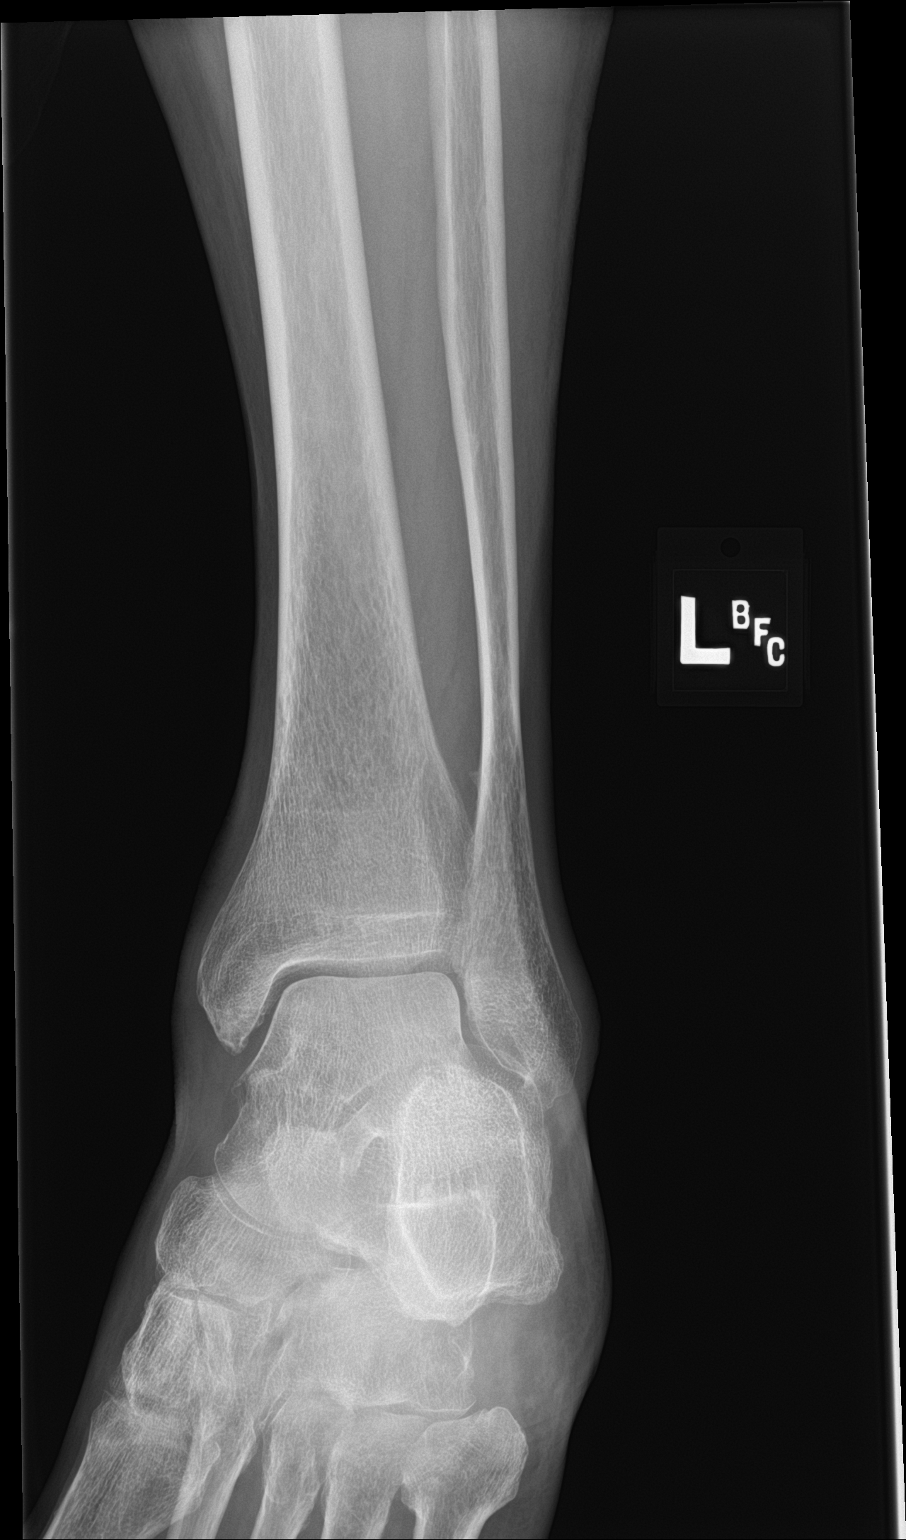

[ankle lat]
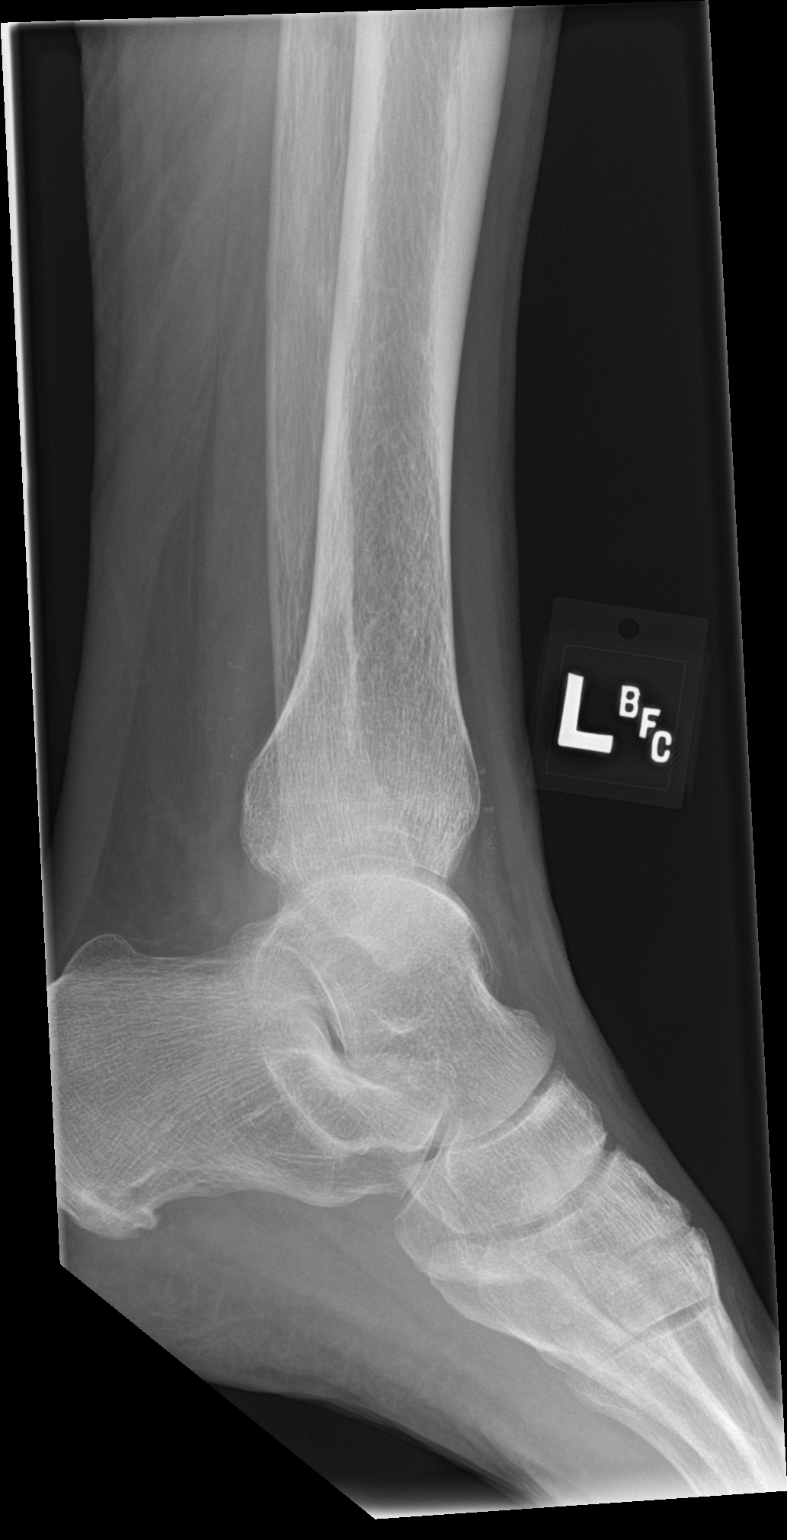

[3 of 3 positions shown; findings below may reference images not displayed]

FINDINGS: There is no evidence of fracture, dislocation, or joint effusion.
There is no evidence of arthropathy or other focal bone abnormality.
Soft tissues are unremarkable.
IMPRESSION: Negative.

## 2020-01-02 ENCOUNTER — Encounter: Payer: Self-pay | Admitting: Podiatry

## 2020-01-02 ENCOUNTER — Other Ambulatory Visit: Payer: Self-pay

## 2020-01-02 ENCOUNTER — Ambulatory Visit: Payer: Medicare Other | Admitting: Podiatry

## 2020-01-02 DIAGNOSIS — E1169 Type 2 diabetes mellitus with other specified complication: Secondary | ICD-10-CM

## 2020-01-02 DIAGNOSIS — B351 Tinea unguium: Secondary | ICD-10-CM

## 2020-01-02 DIAGNOSIS — M2012 Hallux valgus (acquired), left foot: Secondary | ICD-10-CM

## 2020-01-02 DIAGNOSIS — M79674 Pain in right toe(s): Secondary | ICD-10-CM | POA: Diagnosis not present

## 2020-01-02 DIAGNOSIS — M79675 Pain in left toe(s): Secondary | ICD-10-CM

## 2020-01-02 DIAGNOSIS — E669 Obesity, unspecified: Secondary | ICD-10-CM

## 2020-01-02 DIAGNOSIS — M2011 Hallux valgus (acquired), right foot: Secondary | ICD-10-CM

## 2020-01-02 DIAGNOSIS — L600 Ingrowing nail: Secondary | ICD-10-CM

## 2020-01-06 NOTE — Progress Notes (Signed)
Subjective: Howard Shepard presents today for follow up of preventative diabetic foot care with and painful mycotic nails b/l that are difficult to trim. Pain interferes with ambulation. Aggravating factors include wearing enclosed shoe gear. Pain is relieved with periodic professional debridement.   His wife is present during the visit. They voice no new pedal concerns on today's visit.  No Known Allergies   Objective: There were no vitals filed for this visit.  Vascular Examination:  capillary refill time to digits immediate b/l, palpable DP pulses b/l, palpable PT pulses b/l, pedal hair sparse b/l, skin temperature gradient within normal limits b/l and varicosities present b/l  Dermatological Examination: Pedal skin with normal turgor, texture and tone bilaterally. No open wounds bilaterally. No interdigital macerations bilaterally. Toenails 1-5 b/l elongated, discolored, dystrophic, thickened, crumbly with subungual debris and tenderness to dorsal palpation. Incurvated nailplate medial border(s) L 4th toe.  Nail border hypertrophy absent. There is tenderness to palpation. Sign(s) of infection: no clinical signs of infection noted on examination today..  Musculoskeletal: normal muscle strength 5/5 to all lower extremity muscle groups bilaterally, no pain crepitus or joint limitation noted with ROM b/l and bunion deformity noted b/l  Neurological: sensation intact 5/5 intact bilaterally with 10g monofilament b/l and vibratory sensation intact b/l  Assessment: 1. Pain due to onychomycosis of toenails of both feet   2. Ingrown toenail without infection   3. Hallux valgus, acquired, bilateral   4. Diabetes mellitus type 2 in obese [facility])     Plan: -Diabetic foot examination performed on today. -Continue diabetic foot care principles. Literature dispensed on today.  -Toenails 1-5 b/l were debrided in length and girth without iatrogenic bleeding.  -Offending nail borders debrided and  curretaged L 4th toe. Borders cleansed with alcohol. Antibiotic ointment applied. Wife instructed to apply Neosporin to left 4th toe once daily for one week. -Patient to continue soft, supportive shoe gear daily. -Patient to report any pedal injuries to medical professional immediately. -Patient/POA to call should there be question/concern in the interim.  Return in about 3 months (around 04/03/2020) for diabetic foot care.

## 2020-04-10 ENCOUNTER — Ambulatory Visit: Payer: Medicare Other | Admitting: Podiatry

## 2020-07-10 ENCOUNTER — Other Ambulatory Visit: Payer: Self-pay

## 2020-07-10 ENCOUNTER — Ambulatory Visit (INDEPENDENT_AMBULATORY_CARE_PROVIDER_SITE_OTHER): Payer: Medicare Other | Admitting: Podiatry

## 2020-07-10 ENCOUNTER — Encounter: Payer: Self-pay | Admitting: Podiatry

## 2020-07-10 DIAGNOSIS — E119 Type 2 diabetes mellitus without complications: Secondary | ICD-10-CM | POA: Diagnosis not present

## 2020-07-10 DIAGNOSIS — M2012 Hallux valgus (acquired), left foot: Secondary | ICD-10-CM

## 2020-07-10 DIAGNOSIS — M79674 Pain in right toe(s): Secondary | ICD-10-CM | POA: Diagnosis not present

## 2020-07-10 DIAGNOSIS — E1169 Type 2 diabetes mellitus with other specified complication: Secondary | ICD-10-CM | POA: Diagnosis not present

## 2020-07-10 DIAGNOSIS — B351 Tinea unguium: Secondary | ICD-10-CM

## 2020-07-10 DIAGNOSIS — M2011 Hallux valgus (acquired), right foot: Secondary | ICD-10-CM | POA: Diagnosis not present

## 2020-07-10 DIAGNOSIS — M79675 Pain in left toe(s): Secondary | ICD-10-CM | POA: Diagnosis not present

## 2020-07-10 DIAGNOSIS — E669 Obesity, unspecified: Secondary | ICD-10-CM

## 2020-07-14 NOTE — Progress Notes (Signed)
ANNUAL DIABETIC FOOT EXAM  Subjective: Howard Shepard presents today for for annual diabetic foot examination and painful thick toenails that are difficult to trim. Pain interferes with ambulation. Aggravating factors include wearing enclosed shoe gear. Pain is relieved with periodic professional debridement..  Patient relates 10 year h/o diabetes.  Patient denies any h/o foot wounds.  Patient does have diagnosis of neuropathy.   Pearson Grippe, MD is patient's PCP.  Past Medical History:  Diagnosis Date  . CHF (congestive heart failure) (HCC)   . Diabetes mellitus without complication (HCC)   . Hypertension    Patient Active Problem List   Diagnosis Date Noted  . Acute left ankle pain   . Weakness 02/05/2017  . Diabetes mellitus type 2 in nonobese (HCC) 02/05/2017  . Acute cystitis without hematuria   . SHINGLES 07/16/2008  . LUMBAR RADICULOPATHY, LEFT 04/30/2008  . ABDOMINAL PAIN, LEFT LOWER QUADRANT 09/07/2007  . HYPERLIPIDEMIA 02/28/2007  . Essential hypertension 02/28/2007  . GERD 02/28/2007  . SKIN LESIONS, MULTIPLE 02/28/2007  . DIABETES MELLITUS, TYPE II 09/28/2006  . DEPRESSION 09/28/2006  . SLEEP APNEA, OBSTRUCTIVE 09/28/2006  . Congestive heart failure (HCC) 09/28/2006  . HEMORRHOIDS, INTERNAL, WITH BLEEDING 09/28/2006  . ALLERGIC RHINITIS 09/28/2006  . Peptic ulcer, unspecified site, unspecified as acute or chronic, without mention of hemorrhage, perforation, or obstruction 09/28/2006  . ARTHRITIS, RHEUMATOID 09/28/2006  . LOW BACK PAIN 09/28/2006  . URINARY INCONTINENCE 09/28/2006  . TRANSIENT ISCHEMIC ATTACK, HX OF 09/28/2006  . COLONIC POLYPS, HX OF 09/28/2006  . Personal history of urinary calculi 09/28/2006   Past Surgical History:  Procedure Laterality Date  . LEFT AND RIGHT HEART CATHETERIZATION WITH CORONARY ANGIOGRAM N/A 07/14/2011   Procedure: LEFT AND RIGHT HEART CATHETERIZATION WITH CORONARY ANGIOGRAM;  Surgeon: Pamella Pert, MD;  Location:  [facility] CATH LAB;  Service: Cardiovascular;  Laterality: N/A;   Current Outpatient Medications on File Prior to Visit  Medication Sig Dispense Refill  . acetaminophen (TYLENOL) 325 MG tablet Take 2 tablets (650 mg total) by mouth every 6 (six) hours as needed for mild pain (or Fever >/= 101).    . carvedilol (COREG) 12.5 MG tablet Take 12.5 mg by mouth 2 (two) times daily with a meal.    . clopidogrel (PLAVIX) 75 MG tablet Take 75 mg by mouth daily.    . Cyanocobalamin (VITAMIN B 12 PO) Take 1 tablet by mouth daily.    Marland Kitchen donepezil (ARICEPT) 10 MG tablet Take 10 mg by mouth at bedtime.     . folic acid (FOLVITE) 800 MCG tablet Take 800 mcg by mouth daily.    Marland Kitchen glipiZIDE (GLUCOTROL) 10 MG tablet Take 10 mg by mouth 2 (two) times daily before a meal.     . Magnesium 250 MG TABS Take 250 mg by mouth daily.    . methotrexate (RHEUMATREX) 2.5 MG tablet Take 7.5 mg by mouth See admin instructions. Take 7.5mg  by mouth Thursday morning and 7.5mg  by mouth Thursday evening    . Omega-3 Fatty Acids (FISH OIL PO) Take 1 capsule by mouth 2 (two) times daily.    Marland Kitchen omeprazole (PRILOSEC) 20 MG capsule Take 20 mg by mouth daily.    . simvastatin (ZOCOR) 10 MG tablet Take 5 mg by mouth daily at 6 PM.    . simvastatin (ZOCOR) 5 MG tablet Take 5 mg by mouth at bedtime.    . Tamsulosin HCl (FLOMAX) 0.4 MG CAPS Take 0.4 mg by mouth daily after supper.  No current facility-administered medications on file prior to visit.    No Known Allergies Social History   Occupational History  . Not on file  Tobacco Use  . Smoking status: Former Smoker    Types: Cigars    Quit date: 01/23/1997    Years since quitting: 23.4  . Smokeless tobacco: Never Used  Vaping Use  . Vaping Use: Never used  Substance and Sexual Activity  . Alcohol use: No  . Drug use: No  . Sexual activity: Never   Family History  Problem Relation Age of Onset  . Hypertension Other     There is no immunization history on file for this  patient.   Review of Systems: Negative except as noted in the HPI.  Objective: There were no vitals filed for this visit.  Howard Shepard is a pleasant 87 y.o. male in NAD. AAO X 3.  Vascular Examination: Capillary refill time to digits immediate b/l. Palpable pedal pulses b/l LE. Pedal hair sparse. Lower extremity skin temperature gradient within normal limits. Varicosities present b/l.  Dermatological Examination: Pedal skin with normal turgor, texture and tone bilaterally. No open wounds bilaterally. No interdigital macerations bilaterally. Toenails 1-5 b/l elongated, discolored, dystrophic, thickened, crumbly with subungual debris and tenderness to dorsal palpation.  Musculoskeletal Examination: Normal muscle strength 5/5 to all lower extremity muscle groups bilaterally. No pain crepitus or joint limitation noted with ROM b/l. Hallux valgus with bunion deformity noted b/l lower extremities.  Footwear Assessment: Does the patient wear appropriate shoes? Yes. Does the patient need inserts/orthotics? No.  Neurological Examination: Protective sensation intact 5/5 intact bilaterally with 10g monofilament b/l. Vibratory sensation intact b/l.  Assessment: 1. Pain due to onychomycosis of toenails of both feet   2. Hallux valgus, acquired, bilateral   3. Diabetes mellitus type 2 in obese (HCC)   4. Encounter for diabetic foot exam (HCC)     ADA Risk Categorization: Low Risk :  Patient has all of the following: Intact protective sensation No prior foot ulcer  No severe deformity Pedal pulses present  Plan: -Examined patient. -Diabetic foot examination performed on today's visit. -Patient to continue soft, supportive shoe gear daily. -Toenails 1-5 b/l were debrided in length and girth with sterile nail nippers and dremel without iatrogenic bleeding.  -Patient to report any pedal injuries to medical professional immediately. -Patient/POA to call should there be question/concern  in the interim.  Return in about 3 months (around 10/10/2020).  Freddie Breech, DPM

## 2020-10-16 ENCOUNTER — Ambulatory Visit: Payer: Medicare Other | Admitting: Podiatry

## 2020-11-29 ENCOUNTER — Other Ambulatory Visit: Payer: Self-pay

## 2020-11-29 ENCOUNTER — Ambulatory Visit (INDEPENDENT_AMBULATORY_CARE_PROVIDER_SITE_OTHER): Payer: Medicare Other | Admitting: Podiatry

## 2020-11-29 DIAGNOSIS — B351 Tinea unguium: Secondary | ICD-10-CM

## 2020-11-29 DIAGNOSIS — M79675 Pain in left toe(s): Secondary | ICD-10-CM

## 2020-11-29 DIAGNOSIS — E119 Type 2 diabetes mellitus without complications: Secondary | ICD-10-CM | POA: Diagnosis not present

## 2020-11-29 DIAGNOSIS — M79674 Pain in right toe(s): Secondary | ICD-10-CM

## 2020-12-05 ENCOUNTER — Encounter: Payer: Self-pay | Admitting: Podiatry

## 2020-12-05 DIAGNOSIS — J40 Bronchitis, not specified as acute or chronic: Secondary | ICD-10-CM | POA: Insufficient documentation

## 2020-12-05 DIAGNOSIS — R413 Other amnesia: Secondary | ICD-10-CM | POA: Insufficient documentation

## 2020-12-05 DIAGNOSIS — N179 Acute kidney failure, unspecified: Secondary | ICD-10-CM | POA: Insufficient documentation

## 2020-12-05 DIAGNOSIS — R059 Cough, unspecified: Secondary | ICD-10-CM | POA: Insufficient documentation

## 2020-12-05 DIAGNOSIS — S90129A Contusion of unspecified lesser toe(s) without damage to nail, initial encounter: Secondary | ICD-10-CM | POA: Insufficient documentation

## 2020-12-05 DIAGNOSIS — R269 Unspecified abnormalities of gait and mobility: Secondary | ICD-10-CM | POA: Insufficient documentation

## 2020-12-05 DIAGNOSIS — R945 Abnormal results of liver function studies: Secondary | ICD-10-CM | POA: Insufficient documentation

## 2020-12-05 DIAGNOSIS — F0280 Dementia in other diseases classified elsewhere without behavioral disturbance: Secondary | ICD-10-CM | POA: Insufficient documentation

## 2020-12-05 DIAGNOSIS — G8929 Other chronic pain: Secondary | ICD-10-CM | POA: Insufficient documentation

## 2020-12-05 DIAGNOSIS — N183 Chronic kidney disease, stage 3 unspecified: Secondary | ICD-10-CM | POA: Insufficient documentation

## 2020-12-05 DIAGNOSIS — I429 Cardiomyopathy, unspecified: Secondary | ICD-10-CM | POA: Insufficient documentation

## 2020-12-05 DIAGNOSIS — R42 Dizziness and giddiness: Secondary | ICD-10-CM | POA: Insufficient documentation

## 2020-12-05 NOTE — Progress Notes (Signed)
  Subjective:  Patient ID: Howard Shepard, male    DOB: [birth year],  MRN: [id number]  87 y.o. male presents with preventative diabetic foot care and thick, elongated toenails b/l feet which are tender when wearing enclosed shoe gear..    Patient did not check blood glucose this morning.  His wife is present during today's visit.  Review of Systems: Negative except as noted in the HPI.   Allergies  Allergen Reactions   Citalopram Hydrobromide     Other reaction(s): somnolence   Duloxetine Hcl     Other reaction(s): Unknown   Influenza Vaccines     Other reaction(s): Unknown   Metformin     Other reaction(s): Xerostomia   Pneumococcal Vac Polyvalent     Other reaction(s): nausea, weak   Simvastatin     Other reaction(s): fatigue    Objective:  There were no vitals filed for this visit. Constitutional Patient is a pleasant 87 y.o. Caucasian male WD, WN in NAD. AAO x 3.  Vascular Capillary fill time to digits immediate b/l.  DP/PT pulse(s) are palpable b/l lower extremities. Pedal hair sparse. Lower extremity skin temperature gradient within normal limits. No pain with calf compression b/l. No edema noted b/l lower extremities. No cyanosis or clubbing noted.   Neurologic Protective sensation intact 5/5 intact bilaterally with 10g monofilament b/l. Vibratory sensation intact b/l. No clonus b/l.   Dermatologic Pedal skin is warm and supple b/l.  No open wounds b/l lower extremities. No interdigital macerations b/l lower extremities. Toenails 1-5 b/l elongated, discolored, dystrophic, thickened, crumbly with subungual debris and tenderness to dorsal palpation.   Orthopedic: Normal muscle strength 5/5 to all lower extremity muscle groups bilaterally. Hallux valgus with bunion deformity noted b/l lower extremities.    Assessment:   1. Pain due to onychomycosis of toenails of both feet   2. Controlled type 2 diabetes mellitus without complication, without long-term current use of insulin  (HCC)    Plan:  Patient was evaluated and treated and all questions answered. Consent given for treatment as described below: -Examined patient. -Continue diabetic foot care principles: inspect feet daily, monitor glucose as recommended by PCP and/or Endocrinologist, and follow prescribed diet per PCP, Endocrinologist and/or dietician. -Patient to continue soft, supportive shoe gear daily. -Toenails 1-5 b/l were debrided in length and girth with sterile nail nippers and dremel without iatrogenic bleeding.  -Patient to report any pedal injuries to medical professional immediately. -Patient/POA to call should there be question/concern in the interim.  Return in about 3 months (around 03/01/2021).  Freddie Breech, DPM

## 2021-03-08 ENCOUNTER — Emergency Department (HOSPITAL_BASED_OUTPATIENT_CLINIC_OR_DEPARTMENT_OTHER)
Admission: EM | Admit: 2021-03-08 | Discharge: 2021-03-08 | Disposition: A | Discharge status: Home / Self Care | Payer: Medicare Other | Attending: Emergency Medicine | Admitting: Emergency Medicine

## 2021-03-08 ENCOUNTER — Other Ambulatory Visit: Payer: Self-pay

## 2021-03-08 ENCOUNTER — Encounter (HOSPITAL_BASED_OUTPATIENT_CLINIC_OR_DEPARTMENT_OTHER): Payer: Self-pay | Admitting: Emergency Medicine

## 2021-03-08 DIAGNOSIS — F0390 Unspecified dementia without behavioral disturbance: Secondary | ICD-10-CM | POA: Diagnosis not present

## 2021-03-08 DIAGNOSIS — N390 Urinary tract infection, site not specified: Secondary | ICD-10-CM | POA: Diagnosis not present

## 2021-03-08 DIAGNOSIS — Z7902 Long term (current) use of antithrombotics/antiplatelets: Secondary | ICD-10-CM | POA: Insufficient documentation

## 2021-03-08 DIAGNOSIS — R300 Dysuria: Secondary | ICD-10-CM | POA: Diagnosis present

## 2021-03-08 LAB — CBC WITH DIFFERENTIAL/PLATELET
Abs Immature Granulocytes: 0.03 10*3/uL (ref 0.00–0.07)
Basophils Absolute: 0.1 10*3/uL (ref 0.0–0.1)
Basophils Relative: 1 %
Eosinophils Absolute: 0.4 10*3/uL (ref 0.0–0.5)
Eosinophils Relative: 4 %
HCT: 42.1 % (ref 39.0–52.0)
Hemoglobin: 14.7 g/dL (ref 13.0–17.0)
Immature Granulocytes: 0 %
Lymphocytes Relative: 24 %
Lymphs Abs: 2.1 10*3/uL (ref 0.7–4.0)
MCH: 32.5 pg (ref 26.0–34.0)
MCHC: 34.9 g/dL (ref 30.0–36.0)
MCV: 92.9 fL (ref 80.0–100.0)
Monocytes Absolute: 0.5 10*3/uL (ref 0.1–1.0)
Monocytes Relative: 6 %
Neutro Abs: 5.8 10*3/uL (ref 1.7–7.7)
Neutrophils Relative %: 65 %
Platelets: 265 10*3/uL (ref 150–400)
RBC: 4.53 MIL/uL (ref 4.22–5.81)
RDW: 13.2 % (ref 11.5–15.5)
WBC: 9 10*3/uL (ref 4.0–10.5)
nRBC: 0 % (ref 0.0–0.2)

## 2021-03-08 LAB — BASIC METABOLIC PANEL
Anion gap: 8 (ref 5–15)
BUN: 22 mg/dL (ref 8–23)
CO2: 21 mmol/L — ABNORMAL LOW (ref 22–32)
Calcium: 8.9 mg/dL (ref 8.9–10.3)
Chloride: 103 mmol/L (ref 98–111)
Creatinine, Ser: 1.32 mg/dL — ABNORMAL HIGH (ref 0.61–1.24)
GFR, Estimated: 53 mL/min — ABNORMAL LOW (ref >60)
Glucose, Bld: 327 mg/dL — ABNORMAL HIGH (ref 70–99)
Potassium: 4.7 mmol/L (ref 3.5–5.1)
Sodium: 132 mmol/L — ABNORMAL LOW (ref 135–145)

## 2021-03-08 LAB — URINALYSIS, MICROSCOPIC (REFLEX): RBC / HPF: NONE SEEN RBC/hpf (ref 0–5)

## 2021-03-08 LAB — URINALYSIS, ROUTINE W REFLEX MICROSCOPIC
Bilirubin Urine: NEGATIVE
Glucose, UA: >=500 mg/dL — AB
Hgb urine dipstick: NEGATIVE
Ketones, ur: NEGATIVE mg/dL
Leukocytes,Ua: NEGATIVE
Nitrite: NEGATIVE
Protein, ur: NEGATIVE mg/dL
Specific Gravity, Urine: >=1.03 (ref 1.005–1.030)
pH: 5.5 (ref 5.0–8.0)

## 2021-03-08 MED ORDER — CEPHALEXIN 500 MG PO CAPS: 500.0000 mg | ORAL_CAPSULE | Freq: Three times a day (TID) | ORAL | 0 refills | Status: DC

## 2021-03-08 MED ORDER — CEPHALEXIN 250 MG PO CAPS
500.0000 mg | ORAL_CAPSULE | Freq: Once | ORAL | Status: AC
  Administered 2021-03-08: 500 mg via ORAL
  Filled 2021-03-08: qty 2

## 2021-03-08 NOTE — ED Notes (Signed)
Unable to obtain urine, pt is aware that it is needed. 

## 2021-03-08 NOTE — ED Triage Notes (Signed)
Pt arrives pov, daughter endorses concern for UT. Reports abdominal distension and odiferous urine x 2 days. Per daughter, pt at baseline

## 2021-03-08 NOTE — ED Notes (Signed)
Pt aware urine specimen ordered. Pt reports inability to provide specimen at this time. Specimen collection device provided to patient. 

## 2021-03-08 NOTE — ED Provider Notes (Signed)
[facility] EMERGENCY DEPARTMENT Provider Note   CSN: [id number] Arrival date & time: 03/08/21  1504     History  Chief Complaint  Patient presents with   Dysuria    Howard Shepard is a 88 y.o. male.  Level 5 caveat secondary to dementia.  Patient is brought in by his daughter who is giving history.  He has been complaining of some difficulty urinating and painful urination for the last few days.  He has had a UTI before.  No reported fever nausea vomiting.  They have not seen any hematuria.  The history is provided by the patient and a relative.  Dysuria Presenting symptoms: dysuria   Context: during urination   Relieved by:  None tried Worsened by:  Urination Ineffective treatments:  None tried Associated symptoms: abdominal pain   Associated symptoms: no flank pain, no hematuria, no nausea and no vomiting   Risk factors: no urinary catheter       Home Medications Prior to Admission medications   Medication Sig Start Date End Date Taking? Authorizing Provider  acetaminophen (TYLENOL) 325 MG tablet Take 2 tablets (650 mg total) by mouth every 6 (six) hours as needed for mild pain (or Fever >/= 101). 02/07/17   Rosita Fire, MD  carvedilol (COREG) 12.5 MG tablet Take 12.5 mg by mouth 2 (two) times daily with a meal.    [provider]  clopidogrel (PLAVIX) 75 MG tablet Take 75 mg by mouth daily. 09/21/16   [provider]  Cyanocobalamin (VITAMIN B 12 PO) Take 1 tablet by mouth daily.    [provider]  donepezil (ARICEPT) 10 MG tablet Take 10 mg by mouth at bedtime.     [provider]  folic acid (FOLVITE) Q000111Q MCG tablet Take 800 mcg by mouth daily.    [provider]  glipiZIDE (GLUCOTROL) 10 MG tablet Take 10 mg by mouth 2 (two) times daily before a meal.     [provider]  Magnesium 250 MG TABS Take 250 mg by mouth daily.    [provider]  methotrexate (RHEUMATREX) 2.5 MG tablet Take  7.5 mg by mouth See admin instructions. Take 7.5mg  by mouth Thursday morning and 7.5mg  by mouth Thursday evening    [provider]  Omega-3 Fatty Acids (FISH OIL PO) Take 1 capsule by mouth 2 (two) times daily.    [provider]  omeprazole (PRILOSEC) 20 MG capsule Take 20 mg by mouth daily.    [provider]  simvastatin (ZOCOR) 10 MG tablet Take 5 mg by mouth daily at 6 PM.    [provider]  simvastatin (ZOCOR) 5 MG tablet Take 5 mg by mouth at bedtime. 12/30/18   [provider]  Tamsulosin HCl (FLOMAX) 0.4 MG CAPS Take 0.4 mg by mouth daily after supper.    [provider]      Allergies    Citalopram hydrobromide, Duloxetine hcl, Influenza vaccines, Metformin, Pneumococcal vac polyvalent, and Simvastatin    Review of Systems   Review of Systems  Unable to perform ROS: Dementia  Gastrointestinal:  Positive for abdominal pain. Negative for nausea and vomiting.  Genitourinary:  Positive for dysuria. Negative for flank pain and hematuria.   Physical Exam Updated Vital Signs BP (!) 149/73    Pulse 72    Temp (!) 97.4 F (36.3 C) (Tympanic)    Resp 16    Ht 5\' 7"  (1.702 m)    Wt 78.9 kg  SpO2 98%    BMI 27.25 kg/m  Physical Exam Vitals and nursing note reviewed.  Constitutional:      General: He is not in acute distress.    Appearance: Normal appearance. He is well-developed. He is obese.  HENT:     Head: Normocephalic and atraumatic.  Eyes:     Conjunctiva/sclera: Conjunctivae normal.  Cardiovascular:     Rate and Rhythm: Normal rate and regular rhythm.     Heart sounds: No murmur heard. Pulmonary:     Effort: Pulmonary effort is normal. No respiratory distress.     Breath sounds: Normal breath sounds.  Abdominal:     Palpations: Abdomen is soft.     Tenderness: There is abdominal tenderness (suprapubic). There is no guarding or rebound.  Musculoskeletal:        General: No swelling.     Cervical back: Neck supple.   Skin:    General: Skin is warm and dry.     Capillary Refill: Capillary refill takes less than 2 seconds.  Neurological:     General: No focal deficit present.     Mental Status: He is alert. Mental status is at baseline.    ED Results / Procedures / Treatments   Labs (all labs ordered are listed, but only abnormal results are displayed) Labs Reviewed  URINALYSIS, ROUTINE W REFLEX MICROSCOPIC - Abnormal; Notable for the following components:      Result Value   Glucose, UA >=500 (*)    All other components within normal limits  BASIC METABOLIC PANEL - Abnormal; Notable for the following components:   Sodium 132 (*)    CO2 21 (*)    Glucose, Bld 327 (*)    Creatinine, Ser 1.32 (*)    GFR, Estimated 53 (*)    All other components within normal limits  URINALYSIS, MICROSCOPIC (REFLEX) - Abnormal; Notable for the following components:   Bacteria, UA MANY (*)    All other components within normal limits  URINE CULTURE  CBC WITH DIFFERENTIAL/PLATELET    EKG None  Radiology No results found.  Procedures Procedures    Medications Ordered in ED Medications  cephALEXin (KEFLEX) capsule 500 mg (500 mg Oral Given 03/08/21 1901)    ED Course/ Medical Decision Making/ A&P Clinical Course as of 03/09/21 1003  Sat Mar 08, 2021  1728 Bladder scan showed about 50 cc.  [MB]    Clinical Course User Index [MB] Hayden Rasmussen, MD                           Medical Decision Making  This patient complains of urinary frequency and dysuria; this involves an extensive number of treatment Options and is a complaint that carries with it a high risk of complications and Morbidity. The differential includes UTI, urinary retention, prostatitis  I ordered, reviewed and interpreted labs, which included CBC with normal white count normal hemoglobin, chemistries normal other than mildly low bicarb stable from priors, elevated glucose, elevated creatinine better than priors, urinalysis many  bacteria but equivocal for infection I ordered medication oral antibiotics I ordered imaging studies which included bladder scan and I independently    visualized and interpreted imaging which showed no significant retention Additional history obtained from patient's daughter Previous records obtained and reviewed in epic no recent admissions  After the interventions stated above, I reevaluated the patient and found patient to be fairly asymptomatic here.  Reviewed results of work-up with daughter.  They are comfortable plan for treatment with antibiotics and a urine culture.  Recommend close follow-up with PCP and return instructions discussed         Final Clinical Impression(s) / ED Diagnoses Final diagnoses:  Lower urinary tract infectious disease    Rx / DC Orders ED Discharge Orders          Ordered    cephALEXin (KEFLEX) 500 MG capsule  3 times daily        03/08/21 Vernelle Emerald              Hayden Rasmussen, MD 03/09/21 1005

## 2021-03-08 NOTE — Discharge Instructions (Addendum)
You are seen in the emergency department for frequent urination.  Your urine showed possible signs of urinary tract infection.  We are putting you on some antibiotics.  Please drink plenty of fluids.  Follow-up with your primary care doctor.  Return to the emergency department if any worsening or concerning symptoms

## 2021-03-10 LAB — URINE CULTURE: Culture: >=100000 — AB

## 2021-03-11 ENCOUNTER — Telehealth: Payer: Self-pay

## 2021-03-11 NOTE — Telephone Encounter (Signed)
Post ED Visit - Positive Culture Follow-up  Culture report reviewed by antimicrobial stewardship pharmacist: [facility] []  Enzo Bi, Pharm.D. [x]  Celedonio Miyamoto, Pharm.D., BCPS AQ-ID []  Garvin Fila, Pharm.D., BCPS []  Georgina Pillion, Pharm.D., BCPS []  Maunie, [address].D., BCPS, AAHIVP []  Estella Husk, Pharm.D., BCPS, AAHIVP []  Lysle Pearl, PharmD, BCPS []  Phillips Climes, PharmD, BCPS []  Agapito Games, PharmD, BCPS []  Verlan Friends, PharmD []  Mervyn Gay, PharmD, BCPS []  Vinnie Level, PharmD  [facility] []  Len Childs, PharmD []  Greer Pickerel, PharmD []  Adalberto Cole, PharmD []  Perlie Gold, Rph []  Lonell Face) Jean Rosenthal, PharmD []  Earl Many, PharmD []  Junita Push, PharmD []  Dorna Leitz, PharmD []  Terrilee Files, PharmD []  Lynann Beaver, PharmD []  Keturah Barre, PharmD []  Loralee Pacas, PharmD []  Bernadene Person, PharmD   Positive urine culture Treated with Cephalexin, organism sensitive to the same and no further patient follow-up is required at this time.  Sandria Senter 03/11/2021, 12:19 PM

## 2021-04-04 ENCOUNTER — Ambulatory Visit: Payer: Medicare Other | Admitting: Podiatry

## 2021-04-16 ENCOUNTER — Ambulatory Visit: Payer: Medicare Other | Admitting: Podiatry

## 2021-05-26 ENCOUNTER — Ambulatory Visit: Payer: Medicare Other | Admitting: Podiatry

## 2021-05-26 ENCOUNTER — Ambulatory Visit: Payer: Medicare Other | Admitting: Podiatrist

## 2021-05-26 DIAGNOSIS — B351 Tinea unguium: Secondary | ICD-10-CM | POA: Diagnosis not present

## 2021-05-26 DIAGNOSIS — M79674 Pain in right toe(s): Secondary | ICD-10-CM

## 2021-05-26 DIAGNOSIS — M79675 Pain in left toe(s): Secondary | ICD-10-CM | POA: Diagnosis not present

## 2021-05-26 NOTE — Progress Notes (Signed)
Subjective:  ?Patient ID: Howard LeffWilliam R , male    DOB: 01/19/1934,  MRN: [id number] ?  ?88 y.o. male presents with preventative diabetic foot care and thick, elongated toenails b/l feet which are tender when wearing enclosed shoe gear..   ?  ?Patient did not check blood glucose this morning. ?  ?His wife is present during today's visit. ?  ?Review of Systems: Negative except as noted in the HPI.  ?  ?     ?Allergies  ?Allergen Reactions  ? Citalopram Hydrobromide    ?    Other reaction(s): somnolence  ? Duloxetine Hcl    ?    Other reaction(s): Unknown  ? Influenza Vaccines    ?    Other reaction(s): Unknown  ? Metformin    ?    Other reaction(s): Xerostomia  ? Pneumococcal Vac Polyvalent    ?    Other reaction(s): nausea, weak  ? Simvastatin    ?    Other reaction(s): fatigue  ?  ?  ?Objective:  ?There were no vitals filed for this visit. ?Constitutional Patient is a pleasant 88 y.o. Caucasian male WD, WN in NAD. AAO x 3.  ?Vascular Capillary fill time to digits immediate b/l.  DP/PT pulse(s) are palpable b/l lower extremities. Pedal hair sparse. Lower extremity skin temperature gradient within normal limits. No pain with calf compression b/l. No edema noted b/l lower extremities. No cyanosis or clubbing noted.   ?Neurologic Protective sensation intact 5/5 intact bilaterally with 10g monofilament b/l. Vibratory sensation intact b/l. No clonus b/l.   ?Dermatologic Pedal skin is warm and supple b/l.  No open wounds b/l lower extremities. No interdigital macerations b/l lower extremities. Toenails 1-5 b/l elongated, discolored, dystrophic, thickened, crumbly with subungual debris and tenderness to dorsal palpation.   ?Orthopedic: Normal muscle strength 5/5 to all lower extremity muscle groups bilaterally. Hallux valgus with bunion deformity noted b/l lower extremities.  ?  ?Assessment:  ?  ?  ICD-10-CM   ?1. Pain due to onychomycosis of toenails of both feet  B35.1   ? [id number]   ? [id number]   ?  ? ? ?Plan:  ?Patient was  evaluated and treated and all questions answered. ?Consent given for treatment as described below: ?-Patient to continue soft, supportive shoe gear daily. ?-Toenails 1-5 b/l were debrided in length and girth with sterile nail nippers and dremel without iatrogenic bleeding.  ?-Patient to report any pedal injuries to medical professional immediately. ?-Patient will be seen back in 3 months or will call sooner if any questions or concerns arise.  ?  ?Return in about 3 months  ? ?

## 2021-05-28 ENCOUNTER — Encounter: Payer: Self-pay | Admitting: Podiatrist

## 2021-06-23 ENCOUNTER — Ambulatory Visit: Payer: Medicare Other | Admitting: Podiatry

## 2021-07-17 ENCOUNTER — Other Ambulatory Visit (HOSPITAL_COMMUNITY): Payer: Self-pay

## 2021-07-25 ENCOUNTER — Encounter (HOSPITAL_BASED_OUTPATIENT_CLINIC_OR_DEPARTMENT_OTHER): Payer: Self-pay | Admitting: Urology

## 2021-07-25 ENCOUNTER — Other Ambulatory Visit: Payer: Self-pay

## 2021-07-25 ENCOUNTER — Other Ambulatory Visit (HOSPITAL_BASED_OUTPATIENT_CLINIC_OR_DEPARTMENT_OTHER): Payer: Self-pay

## 2021-07-25 ENCOUNTER — Emergency Department (HOSPITAL_BASED_OUTPATIENT_CLINIC_OR_DEPARTMENT_OTHER)
Admission: EM | Admit: 2021-07-25 | Discharge: 2021-07-25 | Disposition: A | Discharge status: Home / Self Care | Payer: Medicare Other | Attending: Emergency Medicine | Admitting: Emergency Medicine

## 2021-07-25 ENCOUNTER — Emergency Department (HOSPITAL_BASED_OUTPATIENT_CLINIC_OR_DEPARTMENT_OTHER): Payer: Medicare Other

## 2021-07-25 DIAGNOSIS — I509 Heart failure, unspecified: Secondary | ICD-10-CM | POA: Diagnosis not present

## 2021-07-25 DIAGNOSIS — E119 Type 2 diabetes mellitus without complications: Secondary | ICD-10-CM | POA: Diagnosis not present

## 2021-07-25 DIAGNOSIS — Z7984 Long term (current) use of oral hypoglycemic drugs: Secondary | ICD-10-CM | POA: Diagnosis not present

## 2021-07-25 DIAGNOSIS — F0390 Unspecified dementia without behavioral disturbance: Secondary | ICD-10-CM | POA: Insufficient documentation

## 2021-07-25 DIAGNOSIS — R079 Chest pain, unspecified: Secondary | ICD-10-CM | POA: Diagnosis not present

## 2021-07-25 DIAGNOSIS — Z79899 Other long term (current) drug therapy: Secondary | ICD-10-CM | POA: Insufficient documentation

## 2021-07-25 DIAGNOSIS — R350 Frequency of micturition: Secondary | ICD-10-CM | POA: Insufficient documentation

## 2021-07-25 DIAGNOSIS — I110 Hypertensive heart disease with heart failure: Secondary | ICD-10-CM | POA: Diagnosis not present

## 2021-07-25 LAB — CBC WITH DIFFERENTIAL/PLATELET
Abs Immature Granulocytes: 0.17 10*3/uL — ABNORMAL HIGH (ref 0.00–0.07)
Basophils Absolute: 0.1 10*3/uL (ref 0.0–0.1)
Basophils Relative: 0 %
Eosinophils Absolute: 0 10*3/uL (ref 0.0–0.5)
Eosinophils Relative: 0 %
HCT: 35.4 % — ABNORMAL LOW (ref 39.0–52.0)
Hemoglobin: 12.4 g/dL — ABNORMAL LOW (ref 13.0–17.0)
Immature Granulocytes: 1 %
Lymphocytes Relative: 10 %
Lymphs Abs: 2 10*3/uL (ref 0.7–4.0)
MCH: 32 pg (ref 26.0–34.0)
MCHC: 35 g/dL (ref 30.0–36.0)
MCV: 91.5 fL (ref 80.0–100.0)
Monocytes Absolute: 1.4 10*3/uL — ABNORMAL HIGH (ref 0.1–1.0)
Monocytes Relative: 7 %
Neutro Abs: 16.8 10*3/uL — ABNORMAL HIGH (ref 1.7–7.7)
Neutrophils Relative %: 82 %
Platelets: 243 10*3/uL (ref 150–400)
RBC: 3.87 MIL/uL — ABNORMAL LOW (ref 4.22–5.81)
RDW: 13.9 % (ref 11.5–15.5)
WBC: 20.5 10*3/uL — ABNORMAL HIGH (ref 4.0–10.5)
nRBC: 0 % (ref 0.0–0.2)

## 2021-07-25 LAB — BASIC METABOLIC PANEL
Anion gap: 8 (ref 5–15)
BUN: 16 mg/dL (ref 8–23)
CO2: 21 mmol/L — ABNORMAL LOW (ref 22–32)
Calcium: 8.4 mg/dL — ABNORMAL LOW (ref 8.9–10.3)
Chloride: 102 mmol/L (ref 98–111)
Creatinine, Ser: 1.35 mg/dL — ABNORMAL HIGH (ref 0.61–1.24)
GFR, Estimated: 51 mL/min — ABNORMAL LOW (ref >60)
Glucose, Bld: 337 mg/dL — ABNORMAL HIGH (ref 70–99)
Potassium: 4.1 mmol/L (ref 3.5–5.1)
Sodium: 131 mmol/L — ABNORMAL LOW (ref 135–145)

## 2021-07-25 LAB — URINALYSIS, ROUTINE W REFLEX MICROSCOPIC
Bilirubin Urine: NEGATIVE
Glucose, UA: >=500 mg/dL — AB
Ketones, ur: NEGATIVE mg/dL
Nitrite: NEGATIVE
Protein, ur: NEGATIVE mg/dL
Specific Gravity, Urine: 1.015 (ref 1.005–1.030)
pH: 5.5 (ref 5.0–8.0)

## 2021-07-25 LAB — URINALYSIS, MICROSCOPIC (REFLEX): WBC, UA: >50 WBC/hpf (ref 0–5)

## 2021-07-25 LAB — TROPONIN I (HIGH SENSITIVITY): Troponin I (High Sensitivity): 8 ng/L (ref <18)

## 2021-07-25 MED ORDER — CEPHALEXIN 500 MG PO CAPS
500.0000 mg | ORAL_CAPSULE | Freq: Three times a day (TID) | ORAL | 0 refills | Status: AC
  Filled 2021-07-25: qty 21, 7d supply, fill #0

## 2021-07-25 NOTE — ED Notes (Signed)
Radiology at bedside

## 2021-07-25 NOTE — Discharge Instructions (Signed)
It does appear that he likely has a urine infection.  Recommend close follow-up with primary care doctor.  His blood sugar is in the past continues to be elevated in the 200/300 range.  He may need continued management and adjustments of medications for this.  If he develops fever greater than 100.4 or significant changes mental status please return for reevaluation.

## 2021-07-25 NOTE — ED Provider Notes (Signed)
[facility] Provider Note   CSN: [id number] Arrival date & time: 07/25/21  1325     History  Chief Complaint  Patient presents with   Urinary Frequency   Chest Pain    Howard Shepard is a 88 y.o. male.  Patient here with urinary frequency.  Family states she has been urinating frequently history of UTIs.  They are worried about his prostate.  Possibly mentions some chest pain earlier this week.  Has a history of dementia, hard to get information from him per daughter.  They state his mental status is at baseline.  He has a history of diabetes, hypertension, heart failure.  He does not appear to be very uncomfortable for the most part is doing well except for urinating frequently.  The history is provided by the patient and a caregiver.      Home Medications Prior to Admission medications   Medication Sig Start Date End Date Taking? Authorizing Provider  acetaminophen (TYLENOL) 325 MG tablet Take 2 tablets (650 mg total) by mouth every 6 (six) hours as needed for mild pain (or Fever >/= 101). 02/07/17   Rosita Fire, MD  carvedilol (COREG) 12.5 MG tablet Take 12.5 mg by mouth 2 (two) times daily with a meal.    [provider]  cephALEXin (KEFLEX) 500 MG capsule Take 1 capsule (500 mg total) by mouth 3 (three) times daily for 7 days. 07/25/21 08/01/21  Shawan Tosh, DO  clopidogrel (PLAVIX) 75 MG tablet Take 75 mg by mouth daily. 09/21/16   [provider]  Cyanocobalamin (VITAMIN B 12 PO) Take 1 tablet by mouth daily.    [provider]  donepezil (ARICEPT) 10 MG tablet Take 10 mg by mouth at bedtime.     [provider]  folic acid (FOLVITE) Q000111Q MCG tablet Take 800 mcg by mouth daily.    [provider]  glipiZIDE (GLUCOTROL) 10 MG tablet Take 10 mg by mouth 2 (two) times daily before a meal.     [provider]  Magnesium 250 MG TABS Take 250 mg by mouth daily.    [provider]   methotrexate (RHEUMATREX) 2.5 MG tablet Take 7.5 mg by mouth See admin instructions. Take 7.5mg  by mouth Thursday morning and 7.5mg  by mouth Thursday evening    [provider]  Omega-3 Fatty Acids (FISH OIL PO) Take 1 capsule by mouth 2 (two) times daily.    [provider]  omeprazole (PRILOSEC) 20 MG capsule Take 20 mg by mouth daily.    [provider]  simvastatin (ZOCOR) 10 MG tablet Take 5 mg by mouth daily at 6 PM.    [provider]  simvastatin (ZOCOR) 5 MG tablet Take 5 mg by mouth at bedtime. 12/30/18   [provider]  Tamsulosin HCl (FLOMAX) 0.4 MG CAPS Take 0.4 mg by mouth daily after supper.    [provider]      Allergies    Citalopram hydrobromide, Duloxetine hcl, Influenza vaccines, Metformin, Pneumococcal vac polyvalent, and Simvastatin    Review of Systems   Review of Systems  Physical Exam Updated Vital Signs  ED Triage Vitals [07/25/21 1339]  Enc Vitals Group     BP 125/78     Pulse Rate 85     Resp 18     Temp 97.6 F (36.4 C)     Temp Source Oral     SpO2 95 %     Weight 173  lb 15.1 oz (78.9 kg)     Height 5\' 7"  (1.702 m)     Head Circumference      Peak Flow      Pain Score 2     Pain Loc      Pain Edu?      Excl. in GC?     Physical Exam Vitals and nursing note reviewed.  Constitutional:      General: He is not in acute distress.    Appearance: He is well-developed. He is not ill-appearing.  HENT:     Head: Normocephalic and atraumatic.  Eyes:     Conjunctiva/sclera: Conjunctivae normal.     Pupils: Pupils are equal, round, and reactive to light.  Cardiovascular:     Rate and Rhythm: Normal rate and regular rhythm.     Pulses:          Carotid pulses are 2+ on the right side and 2+ on the left side.    Heart sounds: No murmur heard. Pulmonary:     Effort: Pulmonary effort is normal. No respiratory distress.     Breath sounds: Normal breath sounds. No decreased breath sounds,  wheezing, rhonchi or rales.  Abdominal:     Palpations: Abdomen is soft.     Tenderness: There is no abdominal tenderness.  Musculoskeletal:        General: No swelling.     Cervical back: Normal range of motion and neck supple.  Skin:    General: Skin is warm and dry.     Capillary Refill: Capillary refill takes less than 2 seconds.  Neurological:     General: No focal deficit present.     Mental Status: He is alert.  Psychiatric:        Mood and Affect: Mood normal.    ED Results / Procedures / Treatments   Labs (all labs ordered are listed, but only abnormal results are displayed) Labs Reviewed  URINALYSIS, ROUTINE W REFLEX MICROSCOPIC - Abnormal; Notable for the following components:      Result Value   APPearance CLOUDY (*)    Glucose, UA >=500 (*)    Hgb urine dipstick TRACE (*)    Leukocytes,Ua TRACE (*)    All other components within normal limits  URINALYSIS, MICROSCOPIC (REFLEX) - Abnormal; Notable for the following components:   Bacteria, UA FEW (*)    All other components within normal limits  CBC WITH DIFFERENTIAL/PLATELET - Abnormal; Notable for the following components:   WBC 20.5 (*)    RBC 3.87 (*)    Hemoglobin 12.4 (*)    HCT 35.4 (*)    Neutro Abs 16.8 (*)    Monocytes Absolute 1.4 (*)    Abs Immature Granulocytes 0.17 (*)    All other components within normal limits  BASIC METABOLIC PANEL - Abnormal; Notable for the following components:   Sodium 131 (*)    CO2 21 (*)    Glucose, Bld 337 (*)    Creatinine, Ser 1.35 (*)    Calcium 8.4 (*)    GFR, Estimated 51 (*)    All other components within normal limits  URINE CULTURE  TROPONIN I (HIGH SENSITIVITY)    EKG EKG Interpretation  Date/Time:  Friday July 25 2021 14:09:22 EDT Ventricular Rate:  77 PR Interval:  177 QRS Duration: 172 QT Interval:  460 QTC Calculation: 521 R Axis:   111 Text Interpretation: Sinus rhythm Consider left ventricular hypertrophy Confirmed by Lockie Mola, Leyton Magoon ([id number])  on 07/25/2021 2:12:13  PM  Radiology DG Chest Portable 1 View  Result Date: 07/25/2021 CLINICAL DATA:  Fever, chest pain EXAM: PORTABLE CHEST 1 VIEW COMPARISON:  Previous studies including radiograph done on 02/05/2017 FINDINGS: Apparent shift of mediastinum to the left may be due to rotation. There is poor inspiration. There is subtle increased density in the left lower lung fields. Left lateral CP angle is indistinct. Rest of the lung fields are unremarkable. There is no pneumothorax. IMPRESSION: Increased density in the left lower lung fields and blunting of left lateral CP angle may suggest small effusion or pleural thickening. There are no signs of pulmonary edema or focal pulmonary consolidation. Electronically Signed   By: Elmer Picker M.D.   On: 07/25/2021 14:34    Procedures Procedures    Medications Ordered in ED Medications - No data to display  ED Course/ Medical Decision Making/ A&P                           Medical Decision Making Amount and/or Complexity of Data Reviewed Labs: ordered. Radiology: ordered.  Risk Prescription drug management.   Woodroe Mode is here with urinary frequency and family concern for UTI.  Normal vitals.  No fever.  History of UTIs.  History of dementia.  Does not provide much history.  Has a history of diabetes, hypertension, heart failure.  Family states that he is may be expressed chest pain earlier in the week as well.  EKG per my review and interpretation shows sinus rhythm.  Bundle branch block.  Some ST depressions but seen on prior EKGs.  Overall EKG is unchanged from 5 years ago.  He overall appears pleasant.  He is at his neurologic and mental baseline per family.  I have no concern for sepsis.  Urinalysis was obtained that is somewhat concerning for infection and will treat with antibiotics.  CBC, BMP, troponin has been ordered to further evaluate for electrolyte abnormality, dehydration, cardiac process.  Will check chest x-ray to  evaluate for pneumonia.  Per my review and interpretation of chest x-ray there is no obvious pneumonia.  Looks to have some chronic scarring in the left lower lung when compared to old chest x-ray.  Has a leukocytosis of 20 but overall nonspecific.  He has no fever.  I am not concerned for sepsis.  Troponin is normal.  Electrolytes have a blood sugar in the 300s but is not in DKA.  Creatinine at baseline.  On chart review blood sugars have been poorly controlled here over the last several months.  Recommend that they closely follow-up with primary care doctor to further discuss medication management.  Overall he appears well.  Have no concern for ACS or other acute cardiac or pulmonary process.  Will prescribe antibiotics for UTI.  Discharged in good condition.  My suspicion that urinary frequency could be in the setting of enlarged prostate or from uncontrolled blood sugars.  Family understands return precautions.  This chart was dictated using voice recognition software.  Despite best efforts to proofread,  errors can occur which can change the documentation meaning.         Final Clinical Impression(s) / ED Diagnoses Final diagnoses:  Urinary frequency    Rx / DC Orders ED Discharge Orders          Ordered    cephALEXin (KEFLEX) 500 MG capsule  3 times daily        07/25/21 1453  Lennice Sites, DO 07/25/21 1500

## 2021-07-25 NOTE — ED Triage Notes (Signed)
Per pt daughter pt having urinary frequency over past 2-3 days H/o UTI, has home health aid Mental status at baseline per daughter, h/o dementia

## 2021-07-28 LAB — URINE CULTURE: Culture: >=100000 — AB

## 2021-07-29 ENCOUNTER — Telehealth: Payer: Self-pay

## 2021-07-29 NOTE — Telephone Encounter (Signed)
Post ED Visit - Positive Culture Follow-up  Culture report reviewed by antimicrobial stewardship pharmacist: [facility] [x]  Wilburn Cornelia, [place].D. []  Celedonio Miyamoto, [address].D., BCPS AQ-ID []  Garvin Fila, Pharm.D., BCPS []  Georgina Pillion, [address].D., BCPS []  [place], [place].D., BCPS, AAHIVP []  Estella Husk, Pharm.D., BCPS, AAHIVP []  Lysle Pearl, PharmD, BCPS []  Phillips Climes, PharmD, BCPS []  Agapito Games, PharmD, BCPS []  Verlan Friends, PharmD []  Mervyn Gay, PharmD, BCPS []  Vinnie Level, PharmD  [facility] []  Len Childs, PharmD []  Greer Pickerel, PharmD []  Adalberto Cole, PharmD []  Perlie Gold, Rph []  Lonell Face) Jean Rosenthal, PharmD []  Earl Many, PharmD []  Junita Push, PharmD []  Dorna Leitz, PharmD []  Terrilee Files, PharmD []  Lynann Beaver, PharmD []  Keturah Barre, PharmD []  Loralee Pacas, PharmD []  Bernadene Person, PharmD   Positive urine culture Treated with Cephalexin, organism sensitive to the same and no further patient follow-up is required at this time.  Sandria Senter 07/29/2021, 3:05 PM

## 2021-08-19 ENCOUNTER — Other Ambulatory Visit: Payer: Self-pay

## 2021-08-19 ENCOUNTER — Emergency Department (HOSPITAL_BASED_OUTPATIENT_CLINIC_OR_DEPARTMENT_OTHER)
Admission: EM | Admit: 2021-08-19 | Discharge: 2021-08-20 | Disposition: A | Discharge status: Home / Self Care | Payer: Medicare Other | Attending: Emergency Medicine | Admitting: Emergency Medicine

## 2021-08-19 ENCOUNTER — Encounter (HOSPITAL_BASED_OUTPATIENT_CLINIC_OR_DEPARTMENT_OTHER): Payer: Self-pay | Admitting: Urology

## 2021-08-19 ENCOUNTER — Emergency Department (HOSPITAL_BASED_OUTPATIENT_CLINIC_OR_DEPARTMENT_OTHER): Payer: Medicare Other

## 2021-08-19 DIAGNOSIS — R7402 Elevation of levels of lactic acid dehydrogenase (LDH): Secondary | ICD-10-CM | POA: Insufficient documentation

## 2021-08-19 DIAGNOSIS — R109 Unspecified abdominal pain: Secondary | ICD-10-CM | POA: Diagnosis not present

## 2021-08-19 DIAGNOSIS — D72829 Elevated white blood cell count, unspecified: Secondary | ICD-10-CM | POA: Diagnosis not present

## 2021-08-19 DIAGNOSIS — I10 Essential (primary) hypertension: Secondary | ICD-10-CM | POA: Diagnosis not present

## 2021-08-19 DIAGNOSIS — F0390 Unspecified dementia without behavioral disturbance: Secondary | ICD-10-CM | POA: Insufficient documentation

## 2021-08-19 DIAGNOSIS — Z7902 Long term (current) use of antithrombotics/antiplatelets: Secondary | ICD-10-CM | POA: Insufficient documentation

## 2021-08-19 DIAGNOSIS — R5383 Other fatigue: Secondary | ICD-10-CM | POA: Diagnosis present

## 2021-08-19 DIAGNOSIS — Z79899 Other long term (current) drug therapy: Secondary | ICD-10-CM | POA: Diagnosis not present

## 2021-08-19 DIAGNOSIS — E119 Type 2 diabetes mellitus without complications: Secondary | ICD-10-CM | POA: Diagnosis not present

## 2021-08-19 DIAGNOSIS — R531 Weakness: Secondary | ICD-10-CM | POA: Insufficient documentation

## 2021-08-19 DIAGNOSIS — N390 Urinary tract infection, site not specified: Secondary | ICD-10-CM | POA: Insufficient documentation

## 2021-08-19 DIAGNOSIS — E871 Hypo-osmolality and hyponatremia: Secondary | ICD-10-CM | POA: Diagnosis not present

## 2021-08-19 DIAGNOSIS — R059 Cough, unspecified: Secondary | ICD-10-CM | POA: Diagnosis not present

## 2021-08-19 LAB — COMPREHENSIVE METABOLIC PANEL
ALT: 16 U/L (ref 0–44)
AST: 20 U/L (ref 15–41)
Albumin: 3 g/dL — ABNORMAL LOW (ref 3.5–5.0)
Alkaline Phosphatase: 94 U/L (ref 38–126)
Anion gap: 11 (ref 5–15)
BUN: 21 mg/dL (ref 8–23)
CO2: 18 mmol/L — ABNORMAL LOW (ref 22–32)
Calcium: 8.5 mg/dL — ABNORMAL LOW (ref 8.9–10.3)
Chloride: 102 mmol/L (ref 98–111)
Creatinine, Ser: 1.46 mg/dL — ABNORMAL HIGH (ref 0.61–1.24)
GFR, Estimated: 47 mL/min — ABNORMAL LOW (ref >60)
Glucose, Bld: 388 mg/dL — ABNORMAL HIGH (ref 70–99)
Potassium: 4 mmol/L (ref 3.5–5.1)
Sodium: 131 mmol/L — ABNORMAL LOW (ref 135–145)
Total Bilirubin: 0.9 mg/dL (ref 0.3–1.2)
Total Protein: 7 g/dL (ref 6.5–8.1)

## 2021-08-19 LAB — URINALYSIS, MICROSCOPIC (REFLEX): WBC, UA: >50 WBC/hpf (ref 0–5)

## 2021-08-19 LAB — CBC WITH DIFFERENTIAL/PLATELET
Abs Immature Granulocytes: 0.11 10*3/uL — ABNORMAL HIGH (ref 0.00–0.07)
Basophils Absolute: 0.1 10*3/uL (ref 0.0–0.1)
Basophils Relative: 0 %
Eosinophils Absolute: 0 10*3/uL (ref 0.0–0.5)
Eosinophils Relative: 0 %
HCT: 37.8 % — ABNORMAL LOW (ref 39.0–52.0)
Hemoglobin: 13 g/dL (ref 13.0–17.0)
Immature Granulocytes: 1 %
Lymphocytes Relative: 4 %
Lymphs Abs: 0.7 10*3/uL (ref 0.7–4.0)
MCH: 31.6 pg (ref 26.0–34.0)
MCHC: 34.4 g/dL (ref 30.0–36.0)
MCV: 91.7 fL (ref 80.0–100.0)
Monocytes Absolute: 0.6 10*3/uL (ref 0.1–1.0)
Monocytes Relative: 4 %
Neutro Abs: 16.9 10*3/uL — ABNORMAL HIGH (ref 1.7–7.7)
Neutrophils Relative %: 91 %
Platelets: 253 10*3/uL (ref 150–400)
RBC: 4.12 MIL/uL — ABNORMAL LOW (ref 4.22–5.81)
RDW: 14.3 % (ref 11.5–15.5)
WBC: 18.4 10*3/uL — ABNORMAL HIGH (ref 4.0–10.5)
nRBC: 0 % (ref 0.0–0.2)

## 2021-08-19 LAB — URINALYSIS, ROUTINE W REFLEX MICROSCOPIC
Glucose, UA: >=500 mg/dL — AB
Ketones, ur: 40 mg/dL — AB
Nitrite: NEGATIVE
Protein, ur: 30 mg/dL — AB
Specific Gravity, Urine: 1.025 (ref 1.005–1.030)
pH: 5 (ref 5.0–8.0)

## 2021-08-19 LAB — TROPONIN I (HIGH SENSITIVITY): Troponin I (High Sensitivity): 18 ng/L — ABNORMAL HIGH (ref <18)

## 2021-08-19 LAB — LACTIC ACID, PLASMA: Lactic Acid, Venous: 2.1 mmol/L (ref 0.5–1.9)

## 2021-08-19 MED ORDER — CEPHALEXIN 250 MG PO CAPS
500.0000 mg | ORAL_CAPSULE | Freq: Once | ORAL | Status: AC
  Administered 2021-08-20: 500 mg via ORAL
  Filled 2021-08-19: qty 2

## 2021-08-19 MED ORDER — CEPHALEXIN 500 MG PO CAPS: 500.0000 mg | ORAL_CAPSULE | Freq: Four times a day (QID) | ORAL | 0 refills | Status: DC

## 2021-08-19 MED ORDER — SODIUM CHLORIDE 0.9 % IV BOLUS
500.0000 mL | Freq: Once | INTRAVENOUS | Status: AC
  Administered 2021-08-19: 500 mL via INTRAVENOUS

## 2021-08-19 NOTE — ED Provider Notes (Signed)
[facility] Provider Note   CSN: [id number] Arrival date & time: 08/19/21  2010     History  Chief Complaint  Patient presents with   Fatigue    Howard Shepard is a 88 y.o. male.  Patient with history of dementia, hypertension, diabetes, heart failure, UTIs --presents to the emergency department for evaluation of generalized weakness.  Patient "felt warm" and was weaker than typical per family earlier today.  There is an episode where he was malpositioned in the bed and family needed to call the fire department for lift assist.  They recommended transport at that time, however family refused.  Patient had an aide come over who help to bathe him, however he was very weak so they decided to bring him into the hospital tonight.  They are uncertain if he has had a fever.  He has had a mild cough.  He was reportedly "breathing fast" earlier today.  Family has a lot of frustration with the home health aides that they are currently dealing with.  No skin rashes or lower extremity swelling.  Patient ate a good breakfast today and has not had any vomiting.  No diarrhea.      Home Medications Prior to Admission medications   Medication Sig Start Date End Date Taking? Authorizing Provider  acetaminophen (TYLENOL) 325 MG tablet Take 2 tablets (650 mg total) by mouth every 6 (six) hours as needed for mild pain (or Fever >/= 101). 02/07/17   Maxie Barb, MD  carvedilol (COREG) 12.5 MG tablet Take 12.5 mg by mouth 2 (two) times daily with a meal.    [provider]  clopidogrel (PLAVIX) 75 MG tablet Take 75 mg by mouth daily. 09/21/16   [provider]  Cyanocobalamin (VITAMIN B 12 PO) Take 1 tablet by mouth daily.    [provider]  donepezil (ARICEPT) 10 MG tablet Take 10 mg by mouth at bedtime.     [provider]  folic acid (FOLVITE) 800 MCG tablet Take 800 mcg by mouth daily.    [provider]  glipiZIDE  (GLUCOTROL) 10 MG tablet Take 10 mg by mouth 2 (two) times daily before a meal.     [provider]  Magnesium 250 MG TABS Take 250 mg by mouth daily.    [provider]  methotrexate (RHEUMATREX) 2.5 MG tablet Take 7.5 mg by mouth See admin instructions. Take 7.5mg  by mouth Thursday morning and 7.5mg  by mouth Thursday evening    [provider]  Omega-3 Fatty Acids (FISH OIL PO) Take 1 capsule by mouth 2 (two) times daily.    [provider]  omeprazole (PRILOSEC) 20 MG capsule Take 20 mg by mouth daily.    [provider]  simvastatin (ZOCOR) 10 MG tablet Take 5 mg by mouth daily at 6 PM.    [provider]  simvastatin (ZOCOR) 5 MG tablet Take 5 mg by mouth at bedtime. 12/30/18   [provider]  Tamsulosin HCl (FLOMAX) 0.4 MG CAPS Take 0.4 mg by mouth daily after supper.    [provider]      Allergies    Citalopram hydrobromide, Duloxetine hcl, Influenza vaccines, Metformin, Pneumococcal vac polyvalent, and Simvastatin    Review of Systems   Review of Systems  Physical Exam Updated Vital Signs BP 117/66   Pulse 88   Temp 99.5 F (37.5 C) (Rectal)   Resp 18   Ht 5\' 7"  (1.702 m)  Wt 81.1 kg   SpO2 96%   BMI 27.99 kg/m   Physical Exam Vitals and nursing note reviewed.  Constitutional:      General: He is not in acute distress.    Appearance: He is well-developed.  HENT:     Head: Normocephalic and atraumatic.     Right Ear: External ear normal.     Left Ear: External ear normal.     Nose: Nose normal.     Mouth/Throat:     Mouth: Mucous membranes are moist.  Eyes:     General:        Right eye: No discharge.        Left eye: No discharge.     Conjunctiva/sclera: Conjunctivae normal.  Cardiovascular:     Rate and Rhythm: Normal rate and regular rhythm.     Heart sounds: Normal heart sounds.     Comments: Regular rhythm Pulmonary:     Effort: Pulmonary effort is normal.     Breath sounds:  Normal breath sounds. No wheezing, rhonchi or rales.     Comments: No tachypnea or respiratory distress.  Lung sounds are clear to auscultation bilaterally. Abdominal:     Palpations: Abdomen is soft.     Tenderness: There is abdominal tenderness. There is no guarding.     Hernia: No hernia is present.     Comments: Generalized tenderness to palpation, overall mild, family states that the patient does not like people touching him.  No signs of rebound or guarding.  Musculoskeletal:     Cervical back: Normal range of motion and neck supple.     Right lower leg: No edema.     Left lower leg: No edema.  Skin:    General: Skin is warm and dry.  Neurological:     Mental Status: He is alert. Mental status is at baseline.    ED Results / Procedures / Treatments   Labs (all labs ordered are listed, but only abnormal results are displayed) Labs Reviewed  CBC WITH DIFFERENTIAL/PLATELET - Abnormal; Notable for the following components:      Result Value   WBC 18.4 (*)    RBC 4.12 (*)    HCT 37.8 (*)    Neutro Abs 16.9 (*)    Abs Immature Granulocytes 0.11 (*)    All other components within normal limits  COMPREHENSIVE METABOLIC PANEL - Abnormal; Notable for the following components:   Sodium 131 (*)    CO2 18 (*)    Glucose, Bld 388 (*)    Creatinine, Ser 1.46 (*)    Calcium 8.5 (*)    Albumin 3.0 (*)    GFR, Estimated 47 (*)    All other components within normal limits  LACTIC ACID, PLASMA - Abnormal; Notable for the following components:   Lactic Acid, Venous 2.1 (*)    All other components within normal limits  URINALYSIS, ROUTINE W REFLEX MICROSCOPIC - Abnormal; Notable for the following components:   APPearance HAZY (*)    Glucose, UA >=500 (*)    Hgb urine dipstick TRACE (*)    Bilirubin Urine SMALL (*)    Ketones, ur 40 (*)    Protein, ur 30 (*)    Leukocytes,Ua TRACE (*)    All other components within normal limits  URINALYSIS, MICROSCOPIC (REFLEX) - Abnormal; Notable  for the following components:   Bacteria, UA FEW (*)    All other components within normal limits  TROPONIN I (HIGH SENSITIVITY) - Abnormal; Notable for  the following components:   Troponin I (High Sensitivity) 18 (*)    All other components within normal limits  URINE CULTURE  LACTIC ACID, PLASMA    ED ECG REPORT   Date: 08/19/2021  Rate: 89  Rhythm: normal sinus rhythm  QRS Axis: Right  Intervals: normal  ST/T Wave abnormalities: normal  Conduction Disutrbances:left bundle branch block  Narrative Interpretation:   Old EKG Reviewed: unchanged  I have personally reviewed the EKG tracing and agree with the computerized printout as noted.   Radiology DG Chest Port 1 View  Result Date: 08/19/2021 CLINICAL DATA:  Weakness EXAM: PORTABLE CHEST 1 VIEW COMPARISON:  07/25/2021, 02/05/2017 FINDINGS: Rotated patient. Possible small left effusion. Difficult to exclude mild airspace disease at the left base. Stable cardiomediastinal silhouette. No pneumothorax. IMPRESSION: 1. Possible small left effusion. Probable subsegmental atelectasis left base Electronically Signed   By: Jasmine Pang M.D.   On: 08/19/2021 21:27    Procedures Procedures    Medications Ordered in ED Medications  sodium chloride 0.9 % bolus 500 mL (0 mLs Intravenous Stopped 08/19/21 2221)    ED Course/ Medical Decision Making/ A&P    Patient seen and examined. History obtained directly from patient.   Labs/EKG: Ordered CBC, CMP, troponin, UA.  EKG personally reviewed and interpreted as above.  Imaging: Ordered chest x-ray.  Personally reviewed and interpreted, no changes from previous, question of small left-sided pleural effusion which was present earlier this month.  Medications/Fluids: Ordered: IV fluid bolus.   Most recent vital signs reviewed and are as follows: BP 117/66   Pulse 88   Temp 99.5 F (37.5 C) (Rectal)   Resp 18   Ht 5\' 7"  (1.702 m)   Wt 81.1 kg   SpO2 96%   BMI 27.99 kg/m    Initial impression: Generalized weakness will evaluate for infection or other metabolic etiology.  11:09 PM Reassessment performed. Patient appears stable, reports being cold.  Rectal temperature 99.5 F.  Labs personally reviewed and interpreted including: CBC with elevated white blood cell count at 18.4 including increased neutrophils, normal hemoglobin; CMP with mild hyponatremia at 131, glucose 388, creatinine 1.46 with normal BUN; lactate minimally elevated at 2.1; first troponin 18.  Reviewed pertinent lab work and imaging with patient at bedside. Questions answered.   Most current vital signs reviewed and are as follows: BP 117/66   Pulse 88   Temp 99.5 F (37.5 C) (Rectal)   Resp 18   Ht 5\' 7"  (1.702 m)   Wt 81.1 kg   SpO2 96%   BMI 27.99 kg/m   Plan: We will cath for UA, and repeat second troponin.  Patient discussed with Dr. .  Likely d/c to home if 2nd troponin neg.  11:52 PM Reassessment performed. Patient appears stable.  Labs personally reviewed and interpreted including: UA with signs of infection, previous culture grew out Proteus with only resistance to nitrofurantoin.  Will start treatment with Keflex.  Most current vital signs reviewed and are as follows: BP 126/76   Pulse 73   Temp 99.5 F (37.5 C) (Rectal)   Resp (!) 25   Ht 5\' 7"  (1.702 m)   Wt 81.1 kg   SpO2 93%   BMI 27.99 kg/m   Plan: Awaiting second troponin.  Likely DC to home if stable.    Prescriptions written for: Keflex  Other home care instructions discussed: Rest, hydration, antibiotics  ED return instructions discussed: Fever, persistent vomiting, worsening abdominal pain or flank pain, shortness of  breath or other concerns  Follow-up instructions discussed: Patient encouraged to follow-up with their PCP in 2-3 days.                            Medical Decision Making Amount and/or Complexity of Data Reviewed Labs: ordered. Radiology: ordered.   Patient here for  evaluation of generalized weakness.  Considered sepsis, pneumonia, UTI on potential etiology.  Also considered acute kidney injury, metabolic or electrolyte derangement, stroke, ACS.  Work-up overall reassuring.  Patient does appear to have recurrent UTI and was initiated on treatment.  He appears chronically ill overall and has supportive family.    The patient's vital signs, pertinent lab work and imaging were reviewed and interpreted as discussed in the ED course. Hospitalization was considered for further testing, treatments, or serial exams/observation. However as patient is well-appearing, has a stable exam, and reassuring studies today, I do not feel that they warrant admission at this time. This plan was discussed with the patient who verbalizes agreement and comfort with this plan and seems reliable and able to return to the Emergency Department with worsening or changing symptoms.          Final Clinical Impression(s) / ED Diagnoses Final diagnoses:  Generalized weakness  Lower urinary tract infectious disease    Rx / DC Orders ED Discharge Orders          Ordered    cephALEXin (KEFLEX) 500 MG capsule  4 times daily        08/19/21 2346              Carlisle Cater, PA-C 08/20/21 0006    Wyvonnia Dusky, MD 08/20/21 [phone]

## 2021-08-20 LAB — TROPONIN I (HIGH SENSITIVITY): Troponin I (High Sensitivity): 16 ng/L (ref <18)

## 2021-08-20 MED ORDER — FOSFOMYCIN TROMETHAMINE 3 G PO PACK
3.0000 g | PACK | Freq: Once | ORAL | Status: AC
  Administered 2021-08-20: 3 g via ORAL
  Filled 2021-08-20: qty 3

## 2021-08-20 NOTE — ED Notes (Signed)
Discharge instructions gone over with Pt. Daughter

## 2021-08-20 NOTE — ED Provider Notes (Signed)
Patient signed out to me to follow-up on second troponin.  Patient seen with fatigue.  Patient's troponin has returned and is normal.  Plan formulated by treatment team was to send home with treatment for UTI.  No sign of urosepsis.  Family raises concerns about repeat Keflex.  He was placed on Keflex 3 or 4 weeks ago for UTI.  A culture is pending.  We will treat with fosfomycin tonight.   Gilda Crease, MD 08/20/21 [phone]

## 2021-08-22 LAB — URINE CULTURE: Culture: 30000 — AB

## 2021-08-23 ENCOUNTER — Telehealth (HOSPITAL_BASED_OUTPATIENT_CLINIC_OR_DEPARTMENT_OTHER): Payer: Self-pay | Admitting: *Deleted

## 2021-08-23 NOTE — Telephone Encounter (Signed)
Post ED Visit - Positive Culture Follow-up  Culture report reviewed by antimicrobial stewardship pharmacist: [facility] []  , Pharm.D. []  Enzo Bi, Pharm.D., BCPS AQ-ID []  , Pharm.D., BCPS []  Celedonio Miyamoto, .D., BCPS []  [place], .D., BCPS, AAHIVP []  Georgina Pillion, Pharm.D., BCPS, AAHIVP []  [address], PharmD, BCPS []  , PharmD, BCPS []  [place], PharmD, BCPS []  [place], PharmD []  , PharmD, BCPS [x]   Estella Husk, PharmD  Pharmacy Team []  Lysle Pearl, PharmD []  , PharmD []  Phillips Climes, PharmD []  , Rph []  Agapito Games) , PharmD []  Verlan Friends, PharmD []  , PharmD []  Mervyn Gay, PharmD []  , PharmD []  Riccardo Dubin, PharmD []  [facility], PharmD []  , PharmD []  Len Childs, PharmD   Positive urine culture Treated with Cephalexin, organism sensitive to the same and no further patient follow-up is required at this time.  08/23/2021, 12:45 PM

## 2021-09-03 ENCOUNTER — Ambulatory Visit (INDEPENDENT_AMBULATORY_CARE_PROVIDER_SITE_OTHER): Payer: Self-pay | Admitting: Podiatry

## 2021-09-03 DIAGNOSIS — Z91199 Patient's noncompliance with other medical treatment and regimen due to unspecified reason: Secondary | ICD-10-CM

## 2021-09-07 NOTE — Progress Notes (Signed)
   Complete physical exam  Patient: Howard Shepard   DOB: 12/13/1996   88 y.o. Male  MRN: [id number]  Subjective:    No chief complaint on file.   Howard Shepard is a 88 y.o. male who presents today for a complete physical exam. She reports consuming a {diet types:17450} diet. {types:19826} She generally feels {DESC; WELL/FAIRLY WELL/POORLY:18703}. She reports sleeping {DESC; WELL/FAIRLY WELL/POORLY:18703}. She {does/does not:200015} have additional problems to discuss today.    Most recent fall risk assessment:    08/20/2021   10:42 AM  Fall Risk   Falls in the past year? 0  Number falls in past yr: 0  Injury with Fall? 0  Risk for fall due to : No Fall Risks  Follow up Falls evaluation completed     Most recent depression screenings:    08/20/2021   10:42 AM 07/11/2020   10:46 AM  PHQ 2/9 Scores  PHQ - 2 Score 0 0  PHQ- 9 Score 5     {VISON DENTAL STD PSA (Optional):27386}  {History (Optional):23778}  Patient Care Team: Jessup, Joy, NP as PCP - General (Nurse Practitioner)   Outpatient Medications Prior to Visit  Medication Sig   fluticasone (FLONASE) 50 MCG/ACT nasal spray Place 2 sprays into both nostrils in the morning and at bedtime. After 7 days, reduce to once daily.   norgestimate-ethinyl estradiol (SPRINTEC 28) 0.25-35 MG-MCG tablet Take 1 tablet by mouth daily.   Nystatin POWD Apply liberally to affected area 2 times per day   spironolactone (ALDACTONE) 100 MG tablet Take 1 tablet (100 mg total) by mouth daily.   No facility-administered medications prior to visit.    ROS        Objective:     There were no vitals taken for this visit. {Vitals History (Optional):23777}  Physical Exam   No results found for any visits on 09/25/21. {Show previous labs (optional):23779}    Assessment & Plan:    Routine Health Maintenance and Physical Exam  Immunization History  Administered Date(s) Administered   DTaP 02/26/1999, 04/24/1999,  07/03/1999, 03/18/2000, 10/02/2003   Hepatitis A 07/29/2007, 08/03/2008   Hepatitis B 12/14/1998, 01/21/1999, 07/03/1999   HiB (PRP-OMP) 02/26/1999, 04/24/1999, 07/03/1999, 03/18/2000   IPV 02/26/1999, 04/24/1999, 12/22/1999, 10/02/2003   Influenza,inj,Quad PF,6+ Mos 11/03/2013   Influenza-Unspecified 02/03/2012   MMR 12/21/2000, 10/02/2003   Meningococcal Polysaccharide 08/03/2011   Pneumococcal Conjugate-13 03/18/2000   Pneumococcal-Unspecified 07/03/1999, 09/16/1999   Tdap 08/03/2011   Varicella 12/22/1999, 07/29/2007    Health Maintenance  Topic Date Due   HIV Screening  Never done   Hepatitis C Screening  Never done   INFLUENZA VACCINE  09/23/2021   PAP-Cervical Cytology Screening  09/25/2021 (Originally 12/13/2019)   PAP SMEAR-Modifier  09/25/2021 (Originally 12/13/2019)   TETANUS/TDAP  09/25/2021 (Originally 08/02/2021)   HPV VACCINES  Discontinued   COVID-19 Vaccine  Discontinued    Discussed health benefits of physical activity, and encouraged her to engage in regular exercise appropriate for her age and condition.  Problem List Items Addressed This Visit   None Visit Diagnoses     Annual physical exam    -  Primary   Cervical cancer screening       Need for Tdap vaccination          No follow-ups on file.     Joy Jessup, NP   

## 2021-12-29 ENCOUNTER — Ambulatory Visit: Payer: Medicare Other | Admitting: Podiatry

## 2021-12-29 ENCOUNTER — Encounter: Payer: Self-pay | Admitting: Podiatry

## 2021-12-29 DIAGNOSIS — M79675 Pain in left toe(s): Secondary | ICD-10-CM | POA: Diagnosis not present

## 2021-12-29 DIAGNOSIS — E119 Type 2 diabetes mellitus without complications: Secondary | ICD-10-CM

## 2021-12-29 DIAGNOSIS — B351 Tinea unguium: Secondary | ICD-10-CM

## 2021-12-29 DIAGNOSIS — M79674 Pain in right toe(s): Secondary | ICD-10-CM

## 2021-12-29 NOTE — Progress Notes (Unsigned)
  Subjective:  Patient ID: Howard Shepard, male    DOB: 04/16/1933,  MRN: [id number]  Howard Shepard presents to clinic today for {jgcomplaint:23593}  Chief Complaint  Patient presents with   Nail Problem    RFC Bilateral nail trim 1-5.  Pt is diabetic and does not check his glucose or A1c.   New problem(s): None. {jgcomplaint:23593}  PCP is Jani Gravel, MD , and last visit was {Time; dates multiple:15870}.  Allergies  Allergen Reactions   Citalopram Hydrobromide     Other reaction(s): somnolence   Duloxetine Hcl     Other reaction(s): Unknown   Influenza Vaccines     Other reaction(s): Unknown   Metformin     Other reaction(s): Xerostomia   Pneumococcal Vac Polyvalent     Other reaction(s): nausea, weak   Simvastatin     Other reaction(s): fatigue    Review of Systems: Negative except as noted in the HPI.  Objective: No changes noted in today's physical examination.  Howard Shepard is a pleasant 88 y.o. male {jgbodyhabitus:24098} AAO x 3. Vascular Capillary fill time to digits immediate b/l.  DP/PT pulse(s) are palpable b/l lower extremities. Pedal hair sparse. Lower extremity skin temperature gradient within normal limits. No pain with calf compression b/l. No edema noted b/l lower extremities. No cyanosis or clubbing noted.   Neurologic Protective sensation intact 5/5 intact bilaterally with 10g monofilament b/l. Vibratory sensation intact b/l. No clonus b/l.   Dermatologic Pedal skin is warm and supple b/l.  No open wounds b/l lower extremities. No interdigital macerations b/l lower extremities. Toenails 1-5 b/l elongated, discolored, dystrophic, thickened, crumbly with subungual debris and tenderness to dorsal palpation.   Orthopedic: Normal muscle strength 5/5 to all lower extremity muscle groups bilaterally. Hallux valgus with bunion deformity noted b/l lower extremities.   Assessment/Plan: No diagnosis found.  No orders of the defined types were placed in this  encounter.   {Jgplan:23602::"-Patient/POA to call should there be question/concern in the interim."}   Return in about 3 months (around 03/31/2022).  Marzetta Board, DPM

## 2022-03-26 ENCOUNTER — Other Ambulatory Visit: Payer: Self-pay

## 2022-04-28 ENCOUNTER — Ambulatory Visit (INDEPENDENT_AMBULATORY_CARE_PROVIDER_SITE_OTHER): Payer: Medicare Other | Admitting: Podiatry

## 2022-04-28 DIAGNOSIS — Z91199 Patient's noncompliance with other medical treatment and regimen due to unspecified reason: Secondary | ICD-10-CM

## 2022-04-29 NOTE — Progress Notes (Signed)
1. No-show for appointment

## 2022-09-07 ENCOUNTER — Encounter: Payer: Self-pay | Admitting: Podiatry

## 2022-09-07 ENCOUNTER — Ambulatory Visit: Payer: Medicare Other | Admitting: Podiatry

## 2022-09-07 DIAGNOSIS — E119 Type 2 diabetes mellitus without complications: Secondary | ICD-10-CM

## 2022-09-07 DIAGNOSIS — M79675 Pain in left toe(s): Secondary | ICD-10-CM

## 2022-09-07 DIAGNOSIS — M79674 Pain in right toe(s): Secondary | ICD-10-CM

## 2022-09-07 DIAGNOSIS — B351 Tinea unguium: Secondary | ICD-10-CM

## 2022-09-07 NOTE — Progress Notes (Signed)
This patient returns to my office for at risk foot care.  This patient requires this care by a professional since this patient will be at risk due to having diabetes.  This patient is unable to cut nails himself since the patient cannot reach his nails.These nails are painful .  He presents to the office with his wife in a wheelchair.  This patient presents for at risk foot care today.  General Appearance  Alert, conversant and in no acute stress.  Vascular  Dorsalis pedis and posterior tibial  pulses are  weakly palpable  bilaterally.  Capillary return is within normal limits  bilaterally. Temperature is within normal limits  bilaterally.  Neurologic  deferred   Nails Thick disfigured discolored nails with subungual debris  from hallux to fifth toes bilaterally. No evidence of bacterial infection or drainage bilaterally.  Orthopedic  No limitations of motion  feet .  No crepitus or effusions noted.  No bony pathology or digital deformities noted.  Skin  normotropic skin with no porokeratosis noted bilaterally.  No signs of infections or ulcers noted.     Onychomycosis  Pain in right toes  Pain in left toes  Consent was obtained for treatment procedures.   Mechanical debridement of nails 1-5  bilaterally performed with a nail nipper.  Filed with dremel without incident.    Return office visit   4 months                   Told patient to return for periodic foot care and evaluation due to potential at risk complications.   Helane Gunther DPM

## 2022-09-08 ENCOUNTER — Encounter (HOSPITAL_COMMUNITY): Payer: Self-pay

## 2022-09-08 ENCOUNTER — Other Ambulatory Visit: Payer: Self-pay

## 2022-09-08 ENCOUNTER — Emergency Department (HOSPITAL_COMMUNITY): Payer: No Typology Code available for payment source

## 2022-09-08 ENCOUNTER — Emergency Department (HOSPITAL_COMMUNITY)
Admission: EM | Admit: 2022-09-08 | Discharge: 2022-09-08 | Disposition: A | Discharge status: Home / Self Care | Payer: No Typology Code available for payment source | Attending: Emergency Medicine | Admitting: Emergency Medicine

## 2022-09-08 DIAGNOSIS — W1811XA Fall from or off toilet without subsequent striking against object, initial encounter: Secondary | ICD-10-CM | POA: Insufficient documentation

## 2022-09-08 DIAGNOSIS — S0990XA Unspecified injury of head, initial encounter: Secondary | ICD-10-CM

## 2022-09-08 DIAGNOSIS — R918 Other nonspecific abnormal finding of lung field: Secondary | ICD-10-CM | POA: Diagnosis not present

## 2022-09-08 DIAGNOSIS — R739 Hyperglycemia, unspecified: Secondary | ICD-10-CM | POA: Diagnosis not present

## 2022-09-08 DIAGNOSIS — Z7901 Long term (current) use of anticoagulants: Secondary | ICD-10-CM | POA: Diagnosis not present

## 2022-09-08 DIAGNOSIS — W19XXXA Unspecified fall, initial encounter: Secondary | ICD-10-CM

## 2022-09-08 DIAGNOSIS — S0993XA Unspecified injury of face, initial encounter: Secondary | ICD-10-CM | POA: Diagnosis not present

## 2022-09-08 DIAGNOSIS — S299XXA Unspecified injury of thorax, initial encounter: Secondary | ICD-10-CM | POA: Diagnosis not present

## 2022-09-08 DIAGNOSIS — S0083XA Contusion of other part of head, initial encounter: Secondary | ICD-10-CM | POA: Diagnosis not present

## 2022-09-08 DIAGNOSIS — S199XXA Unspecified injury of neck, initial encounter: Secondary | ICD-10-CM | POA: Diagnosis not present

## 2022-09-08 DIAGNOSIS — F0390 Unspecified dementia without behavioral disturbance: Secondary | ICD-10-CM | POA: Diagnosis not present

## 2022-09-08 DIAGNOSIS — S3993XA Unspecified injury of pelvis, initial encounter: Secondary | ICD-10-CM | POA: Diagnosis not present

## 2022-09-08 DIAGNOSIS — Z743 Need for continuous supervision: Secondary | ICD-10-CM | POA: Diagnosis not present

## 2022-09-08 DIAGNOSIS — R6889 Other general symptoms and signs: Secondary | ICD-10-CM | POA: Diagnosis not present

## 2022-09-08 DIAGNOSIS — M26641 Arthritis of right temporomandibular joint: Secondary | ICD-10-CM | POA: Diagnosis not present

## 2022-09-08 DIAGNOSIS — M549 Dorsalgia, unspecified: Secondary | ICD-10-CM | POA: Diagnosis not present

## 2022-09-08 DIAGNOSIS — M160 Bilateral primary osteoarthritis of hip: Secondary | ICD-10-CM | POA: Diagnosis not present

## 2022-09-08 DIAGNOSIS — R270 Ataxia, unspecified: Secondary | ICD-10-CM | POA: Diagnosis not present

## 2022-09-08 LAB — BASIC METABOLIC PANEL
Anion gap: 12 (ref 5–15)
BUN: 7 mg/dL — ABNORMAL LOW (ref 8–23)
CO2: 17 mmol/L — ABNORMAL LOW (ref 22–32)
Calcium: 8.5 mg/dL — ABNORMAL LOW (ref 8.9–10.3)
Chloride: 103 mmol/L (ref 98–111)
Creatinine, Ser: 1.24 mg/dL (ref 0.61–1.24)
GFR, Estimated: 56 mL/min — ABNORMAL LOW (ref >60)
Glucose, Bld: 428 mg/dL — ABNORMAL HIGH (ref 70–99)
Potassium: 3.6 mmol/L (ref 3.5–5.1)
Sodium: 132 mmol/L — ABNORMAL LOW (ref 135–145)

## 2022-09-08 LAB — APTT: aPTT: 26 seconds (ref 24–36)

## 2022-09-08 LAB — CBC
HCT: 41.7 % (ref 39.0–52.0)
Hemoglobin: 14 g/dL (ref 13.0–17.0)
MCH: 31 pg (ref 26.0–34.0)
MCHC: 33.6 g/dL (ref 30.0–36.0)
MCV: 92.5 fL (ref 80.0–100.0)
Platelets: 288 10*3/uL (ref 150–400)
RBC: 4.51 MIL/uL (ref 4.22–5.81)
RDW: 13.6 % (ref 11.5–15.5)
WBC: 12.7 10*3/uL — ABNORMAL HIGH (ref 4.0–10.5)
nRBC: 0 % (ref 0.0–0.2)

## 2022-09-08 LAB — PROTIME-INR
INR: 1.1 (ref 0.8–1.2)
Prothrombin Time: 14.5 seconds (ref 11.4–15.2)

## 2022-09-08 LAB — CBG MONITORING, ED: Glucose-Capillary: 407 mg/dL — ABNORMAL HIGH (ref 70–99)

## 2022-09-08 NOTE — ED Notes (Signed)
CT does not have a room at this time

## 2022-09-08 NOTE — ED Notes (Signed)
Patient transported to CT 

## 2022-09-08 NOTE — ED Provider Notes (Signed)
[place] EMERGENCY DEPARTMENT AT [facility] Provider Note   CSN: [id number] Arrival date & time: 09/08/22  1430     History  Chief Complaint  Patient presents with   Howard Shepard is a 89 y.o. male.  HPI Patient presents via EMS as a level 2 trauma. History is obtained by EMS, the patient and eventually his daughter who arrived at bedside. Patient's dementia is notable, limiting, level 5 caveat. According to daughter the patient had 2 falls over the past 12 hours, no reported loss of consciousness, but he did sustain a hematoma to his forehead. Patient is on Plavix. When asked about where he hurts the patient states that he hurts all over, cannot specify head, neck, chest, lower extremities. EMS reports no hemodynamic instability en route.  Daughter reports that the patient is interacting in a typical manner.   Home Medications Prior to Admission medications   Medication Sig Start Date End Date Taking? Authorizing Provider  acetaminophen (TYLENOL) 325 MG tablet Take 2 tablets (650 mg total) by mouth every 6 (six) hours as needed for mild pain (or Fever >/= 101). 02/07/17   Maxie Barb, MD  carvedilol (COREG) 12.5 MG tablet Take 12.5 mg by mouth 2 (two) times daily with a meal.    [provider]  cephALEXin (KEFLEX) 500 MG capsule Take 1 capsule (500 mg total) by mouth 4 (four) times daily. 08/19/21   Renne Crigler, PA-C  clopidogrel (PLAVIX) 75 MG tablet Take 75 mg by mouth daily. 09/21/16   [provider]  Cyanocobalamin (VITAMIN B 12 PO) Take 1 tablet by mouth daily.    [provider]  donepezil (ARICEPT) 10 MG tablet Take 10 mg by mouth at bedtime.     [provider]  folic acid (FOLVITE) 800 MCG tablet Take 800 mcg by mouth daily.    [provider]  glipiZIDE (GLUCOTROL) 10 MG tablet Take 10 mg by mouth 2 (two) times daily before a meal.     [provider]  Magnesium 250 MG TABS Take  250 mg by mouth daily.    [provider]  methotrexate (RHEUMATREX) 2.5 MG tablet Take 7.5 mg by mouth See admin instructions. Take 7.5mg  by mouth Thursday morning and 7.5mg  by mouth Thursday evening    [provider]  Omega-3 Fatty Acids (FISH OIL PO) Take 1 capsule by mouth 2 (two) times daily.    [provider]  omeprazole (PRILOSEC) 20 MG capsule Take 20 mg by mouth daily.    [provider]  simvastatin (ZOCOR) 10 MG tablet Take 5 mg by mouth daily at 6 PM.    [provider]  simvastatin (ZOCOR) 5 MG tablet Take 5 mg by mouth at bedtime. 12/30/18   [provider]  Tamsulosin HCl (FLOMAX) 0.4 MG CAPS Take 0.4 mg by mouth daily after supper.    [provider]      Allergies    Citalopram hydrobromide, Duloxetine hcl, Influenza vaccines, Metformin, Pneumococcal vac polyvalent, and Simvastatin    Review of Systems   Review of Systems  Unable to perform ROS: Dementia    Physical Exam Updated Vital Signs BP (!) 150/75   Pulse 86   Temp 97.7 F (36.5 C) (Temporal)   Resp (!) 21   SpO2 93%  Physical Exam Vitals and nursing note reviewed.  Constitutional:      General: He is not in acute distress.    Appearance:  He is well-developed. He is not toxic-appearing.  HENT:     Head: Normocephalic.   Eyes:     Conjunctiva/sclera: Conjunctivae normal.  Cardiovascular:     Rate and Rhythm: Normal rate and regular rhythm.  Pulmonary:     Effort: Pulmonary effort is normal. No respiratory distress.     Breath sounds: No stridor.  Abdominal:     General: There is no distension.  Skin:    General: Skin is warm and dry.  Neurological:     Mental Status: He is alert.     Cranial Nerves: No dysarthria.     Motor: Atrophy present. No abnormal muscle tone.  Psychiatric:        Cognition and Memory: Memory is impaired.     ED Results / Procedures / Treatments   Labs (all labs ordered are listed, but only abnormal  results are displayed) Labs Reviewed  CBC - Abnormal; Notable for the following components:      Result Value   WBC 12.7 (*)    All other components within normal limits  BASIC METABOLIC PANEL - Abnormal; Notable for the following components:   Sodium 132 (*)    CO2 17 (*)    Glucose, Bld 428 (*)    BUN 7 (*)    Calcium 8.5 (*)    GFR, Estimated 56 (*)    All other components within normal limits  CBG MONITORING, ED - Abnormal; Notable for the following components:   Glucose-Capillary 407 (*)    All other components within normal limits  APTT  PROTIME-INR    EKG None  Radiology DG Chest Portable 1 View  Result Date: 09/08/2022 CLINICAL DATA:  trauma EXAM: PORTABLE CHEST 1 VIEW COMPARISON:  08/19/2021. FINDINGS: There is left retrocardiac airspace opacity obscuring the left hemidiaphragm and descending thoracic aorta suggesting combination of left lower lobe atelectasis and/or consolidation. Bilateral lung fields are otherwise clear. Bilateral lateral costophrenic angles are clear. Stable cardio-mediastinal silhouette. No acute osseous abnormalities. Old healed fracture of right posterior seventh and eighth ribs noted. Old lateral resection of right clavicle noted. The soft tissues are within normal limits. IMPRESSION: *Left lower lobe atelectasis and/or consolidation. Electronically Signed   By: Jules Schick M.D.   On: 09/08/2022 15:31   DG Pelvis Portable  Result Date: 09/08/2022 CLINICAL DATA:  trauma EXAM: PORTABLE PELVIS 1-2 VIEWS COMPARISON:  Hip radiograph from 09/11/2017 FINDINGS: Limited portable exam. No acute displaced fracture. There are mild degenerative changes of bilateral hip joints without significant joint space narrowing. Osteophytosis of the superior acetabulum. No radiopaque foreign bodies. IMPRESSION: *Mild degenerative changes. No acute displaced fracture or dislocation. Correlate clinically determine the need for additional evaluation with more sensitive  modality such as CT scan of the pelvis. Electronically Signed   By: Jules Schick M.D.   On: 09/08/2022 15:29    Procedures Procedures    Medications Ordered in ED Medications - No data to display  ED Course/ Medical Decision Making/ A&P                             Medical Decision Making Male on Plavix presents as a level 2 trauma after multiple falls including head trauma.  Patient awake, alert, neurologically at baseline, cognitively seemingly at baseline but given unclear circumstances of the fall, concern for trauma, intracranial injury, patient had labs x-ray monitors. Monitor 90 sinus normal Pulse ox 95% room air normal   Amount and/or  Complexity of Data Reviewed Independent Historian:     Details: Daughter and EMS External Data Reviewed: notes. Labs: ordered. Decision-making details documented in ED Course. Radiology: ordered and independent interpretation performed. Decision-making details documented in ED Course.  Risk Decision regarding hospitalization. Diagnosis or treatment significantly limited by social determinants of health.   4:09 PM On repeat exam patient is in no distress, is in similar condition. Initial studies reassuring, x-ray, CT, labs, no hip fracture, chest abnormality, no head face neck CT substantial abnormalities beyond intracranial atrophy.  Without neurodeficits, without hemodynamic instability, with reassuring CT, x-ray imaging, and per the daughter's recognition the patient is interacting at baseline he was discharged in stable condition.        Final Clinical Impression(s) / ED Diagnoses Final diagnoses:  Fall, initial encounter  Injury of head, initial encounter    Rx / DC Orders ED Discharge Orders     None         Gerhard Munch, MD 09/08/22 1609

## 2022-09-08 NOTE — ED Triage Notes (Signed)
Pr BIB EMS due to a fall. Pt had 2 falls, last fall was at 1:30pm, pt had mechanical fall off commode. C/O back pain and pain all over. Pt is on plavix, hit head and has a small head laceration on forehead. Pt is to baseline.

## 2022-09-08 NOTE — Progress Notes (Signed)
Responded to page to support patient that had multiple falls. Chaplain provided emotional and spiritual support. Chaplain available as needed.  Howard Shepard, [place], [facility], Pager [phone]

## 2022-09-08 NOTE — Progress Notes (Signed)
Orthopedic Tech Progress Note Patient Details:  [name] [birth year] [id number]  Level 2 trauma   Patient ID: Howard Shepard, male   DOB: 04-Oct-1933, 89 y.o.   MRN: [id number]  Donald Pore 09/08/2022, 3:07 PM

## 2022-09-08 NOTE — Discharge Instructions (Signed)
 Today's evaluation has been reassuring.  Return here for concerning changes in your condition.

## 2022-09-10 ENCOUNTER — Emergency Department (HOSPITAL_COMMUNITY)
Admission: EM | Admit: 2022-09-10 | Discharge: 2022-09-10 | Disposition: A | Discharge status: Home / Self Care | Payer: No Typology Code available for payment source | Attending: Emergency Medicine | Admitting: Emergency Medicine

## 2022-09-10 ENCOUNTER — Other Ambulatory Visit: Payer: Self-pay

## 2022-09-10 DIAGNOSIS — Z79899 Other long term (current) drug therapy: Secondary | ICD-10-CM | POA: Diagnosis not present

## 2022-09-10 DIAGNOSIS — G309 Alzheimer's disease, unspecified: Secondary | ICD-10-CM | POA: Insufficient documentation

## 2022-09-10 DIAGNOSIS — I509 Heart failure, unspecified: Secondary | ICD-10-CM | POA: Insufficient documentation

## 2022-09-10 DIAGNOSIS — Z7902 Long term (current) use of antithrombotics/antiplatelets: Secondary | ICD-10-CM | POA: Diagnosis not present

## 2022-09-10 DIAGNOSIS — R531 Weakness: Secondary | ICD-10-CM | POA: Diagnosis not present

## 2022-09-10 DIAGNOSIS — Z7984 Long term (current) use of oral hypoglycemic drugs: Secondary | ICD-10-CM | POA: Insufficient documentation

## 2022-09-10 DIAGNOSIS — E119 Type 2 diabetes mellitus without complications: Secondary | ICD-10-CM | POA: Insufficient documentation

## 2022-09-10 DIAGNOSIS — N3000 Acute cystitis without hematuria: Secondary | ICD-10-CM

## 2022-09-10 DIAGNOSIS — Z743 Need for continuous supervision: Secondary | ICD-10-CM | POA: Diagnosis not present

## 2022-09-10 DIAGNOSIS — R6889 Other general symptoms and signs: Secondary | ICD-10-CM | POA: Diagnosis not present

## 2022-09-10 DIAGNOSIS — R739 Hyperglycemia, unspecified: Secondary | ICD-10-CM | POA: Diagnosis not present

## 2022-09-10 LAB — URINALYSIS, W/ REFLEX TO CULTURE (INFECTION SUSPECTED)
Bilirubin Urine: NEGATIVE
Glucose, UA: >=500 mg/dL — AB
Hgb urine dipstick: NEGATIVE
Ketones, ur: NEGATIVE mg/dL
Nitrite: POSITIVE — AB
Protein, ur: NEGATIVE mg/dL
Specific Gravity, Urine: 1.028 (ref 1.005–1.030)
WBC, UA: >50 WBC/hpf (ref 0–5)
pH: 5 (ref 5.0–8.0)

## 2022-09-10 MED ORDER — CEPHALEXIN 500 MG PO CAPS: 500.0000 mg | ORAL_CAPSULE | Freq: Four times a day (QID) | ORAL | 0 refills | Status: DC

## 2022-09-10 NOTE — Discharge Instructions (Signed)
Please follow-up with your primary care provider regarding recent symptoms and ER visits.  Today your urine shows that you have a UTI and you will be given antibiotics.  Please take this medication as prescribed and if symptoms change or worsen please return to ER.

## 2022-09-10 NOTE — ED Provider Notes (Signed)
[place] EMERGENCY DEPARTMENT AT [facility] Provider Note   CSN: [id number] Arrival date & time: 09/10/22  1236     History  Chief Complaint  Patient presents with   Weakness    Pt coming from home with increased weakness and frequent falls per daughter and wife. Pt was seen here yesterday for same but per daughter his urine was never assessed for UTI, which he is prone to.     Howard Shepard is a 89 y.o. male history of type 2 diabetes, CHF,, Alzheimer's presented with falls.  Patient was seen yesterday for the same chief concern and ultimately discharged however was brought back as patient slid out of the bed today as per the wife and landed on his butt.  Patient states he can walk but needs a walker which is normal for him however wife states he is not walking.  Wife denies any recent illnesses but states that his urine has a very strong odor.  Patient is unable to state whether or not it burns when he pees or he has any discomfort when he urinates.  Wife states that yesterday urine was not collected and thinks that he may have a UTI and wants to have his urine checked.  Patient wife denies chest pain, shortness of breath, fevers, recent illness, vision changes, head trauma, abdominal pain, nausea/vomiting  Home Medications Prior to Admission medications   Medication Sig Start Date End Date Taking? Authorizing Provider  acetaminophen (TYLENOL) 325 MG tablet Take 2 tablets (650 mg total) by mouth every 6 (six) hours as needed for mild pain (or Fever >/= 101). 02/07/17   Maxie Barb, MD  carvedilol (COREG) 12.5 MG tablet Take 12.5 mg by mouth 2 (two) times daily with a meal.    [provider]  cephALEXin (KEFLEX) 500 MG capsule Take 1 capsule (500 mg total) by mouth 4 (four) times daily. 09/10/22   Netta Corrigan, PA-C  clopidogrel (PLAVIX) 75 MG tablet Take 75 mg by mouth daily. 09/21/16   [provider]  Cyanocobalamin (VITAMIN B 12 PO) Take  1 tablet by mouth daily.    [provider]  donepezil (ARICEPT) 10 MG tablet Take 10 mg by mouth at bedtime.     [provider]  folic acid (FOLVITE) 800 MCG tablet Take 800 mcg by mouth daily.    [provider]  glipiZIDE (GLUCOTROL) 10 MG tablet Take 10 mg by mouth 2 (two) times daily before a meal.     [provider]  Magnesium 250 MG TABS Take 250 mg by mouth daily.    [provider]  methotrexate (RHEUMATREX) 2.5 MG tablet Take 7.5 mg by mouth See admin instructions. Take 7.5mg  by mouth Thursday morning and 7.5mg  by mouth Thursday evening    [provider]  Omega-3 Fatty Acids (FISH OIL PO) Take 1 capsule by mouth 2 (two) times daily.    [provider]  omeprazole (PRILOSEC) 20 MG capsule Take 20 mg by mouth daily.    [provider]  simvastatin (ZOCOR) 10 MG tablet Take 5 mg by mouth daily at 6 PM.    [provider]  simvastatin (ZOCOR) 5 MG tablet Take 5 mg by mouth at bedtime. 12/30/18   [provider]  Tamsulosin HCl (FLOMAX) 0.4 MG CAPS Take 0.4 mg by mouth daily after supper.    [provider]      Allergies    Citalopram hydrobromide, Duloxetine hcl, Influenza  vaccines, Metformin, Pneumococcal vac polyvalent, and Simvastatin    Review of Systems   Review of Systems  Neurological:  Positive for weakness.    Physical Exam Updated Vital Signs BP 139/63 (BP Location: Right Arm)   Pulse 68   Temp 97.9 F (36.6 C)   Resp 16   Wt 74.8 kg   SpO2 96%   BMI 25.84 kg/m  Physical Exam Vitals reviewed.  Constitutional:      General: He is not in acute distress. HENT:     Head: Normocephalic and atraumatic.  Eyes:     Extraocular Movements: Extraocular movements intact.     Conjunctiva/sclera: Conjunctivae normal.     Pupils: Pupils are equal, round, and reactive to light.  Cardiovascular:     Rate and Rhythm: Normal rate and regular rhythm.     Pulses: Normal pulses.      Heart sounds: Normal heart sounds.     Comments: 2+ bilateral radial/dorsalis pedis pulses with regular rate Pulmonary:     Effort: Pulmonary effort is normal. No respiratory distress.     Breath sounds: Normal breath sounds.  Abdominal:     Palpations: Abdomen is soft.     Tenderness: There is no abdominal tenderness. There is no guarding or rebound.  Musculoskeletal:        General: Normal range of motion.     Cervical back: Normal range of motion and neck supple.     Comments: 5 out of 5 bilateral grip/leg extension strength Atrophy noted  Skin:    General: Skin is warm and dry.     Capillary Refill: Capillary refill takes less than 2 seconds.  Neurological:     General: No focal deficit present.     Mental Status: He is alert and oriented to person, place, and time.     Comments: Sensation intact in all 4 limbs  Psychiatric:        Mood and Affect: Mood normal.     Comments: Is unsure why he is here but able to give full and thoughtful answers     ED Results / Procedures / Treatments   Labs (all labs ordered are listed, but only abnormal results are displayed) Labs Reviewed  URINALYSIS, W/ REFLEX TO CULTURE (INFECTION SUSPECTED) - Abnormal; Notable for the following components:      Result Value   APPearance HAZY (*)    Glucose, UA >=500 (*)    Nitrite POSITIVE (*)    Leukocytes,Ua MODERATE (*)    Bacteria, UA MANY (*)    All other components within normal limits  URINE CULTURE    EKG None  Radiology No results found.  Procedures Procedures    Medications Ordered in ED Medications - No data to display  ED Course/ Medical Decision Making/ A&P                             Medical Decision Making  Howard Shepard 89 y.o. presented today for weakness, falls. Working DDx that I considered at this time includes, but not limited to, dementia, UTI, ICH, epidural/subdural hematoma, basilar skull fracture.  R/o DDx: ICH, epidural/subdural hematoma,  basilar skull fracture: These are considered less likely due to history of present illness and physical exam findings  Review of prior external notes: 09/08/2022 ED provider  Unique Tests and My Interpretation:  UA: Nitrite positive  Discussion with Independent Historian:  Wife  Discussion of Management of Tests: None  Risk: Medium: prescription drug management  Risk Stratification Score: None  Plan: On exam patient was no acute distress stable vitals.  Patient was reportedly at his baseline of dementia according to his wife.  On exam patient had good muscular strength and was neurovascularly intact.  Wife states that he is having strong urine indicative of UTI and with his continuous falls wants to have his urine checked.  Patient had full workup done 2 days ago which was ultimately negative and denies any new changes outside of sliding out of bed today and landing on his butt.  Patient denies any pain and does not want an x-ray at this time and his wife does not believe an x-ray is necessary either.  Due to patient's repeated falls and reported strong urinary odor a urine was obtained.  Patient be monitored and stable at this time.  Urine came back positive for UTI.  Patient was started on Keflex and encouraged to follow-up with his primary care provider.  Urine culture was sent out.  Patient and wife were encouraged to follow-up with primary care provider and to monitor symptoms and take medication as prescribed.  Patient was given return precautions. Patient stable for discharge at this time.  Patient verbalized understanding of plan.         Final Clinical Impression(s) / ED Diagnoses Final diagnoses:  Acute cystitis without hematuria    Rx / DC Orders ED Discharge Orders          Ordered    cephALEXin (KEFLEX) 500 MG capsule  4 times daily        09/10/22 1526              Netta Corrigan, PA-C 09/10/22 1527    Melene Plan, DO 09/10/22 1543

## 2022-09-10 NOTE — ED Notes (Signed)
Got patient vitals

## 2022-09-11 LAB — URINE CULTURE: Culture: >=100000 — AB

## 2022-09-12 LAB — URINE CULTURE

## 2022-09-13 ENCOUNTER — Telehealth (HOSPITAL_BASED_OUTPATIENT_CLINIC_OR_DEPARTMENT_OTHER): Payer: Self-pay | Admitting: *Deleted

## 2022-09-13 NOTE — Telephone Encounter (Signed)
Post ED Visit - Positive Culture Follow-up  Culture report reviewed by antimicrobial stewardship pharmacist: [facility] []  Enzo Bi, Pharm.D. []  Celedonio Miyamoto, Pharm.D., BCPS AQ-ID []  Garvin Fila, Pharm.D., BCPS []  Georgina Pillion, Pharm.D., BCPS []  [place], [address].D., BCPS, AAHIVP []  Estella Husk, Pharm.D., BCPS, AAHIVP []  Lysle Pearl, PharmD, BCPS []  Phillips Climes, PharmD, BCPS []  Agapito Games, PharmD, BCPS []  Verlan Friends, PharmD []  Mervyn Gay, PharmD, BCPS [x]  Riccardo Dubin, PharmD  [facility] []  Len Childs, PharmD []  Greer Pickerel, PharmD []  Adalberto Cole, PharmD []  Perlie Gold, Rph []  Lonell Face) Jean Rosenthal, PharmD []  Earl Many, PharmD []  Junita Push, PharmD []  Dorna Leitz, PharmD []  Terrilee Files, PharmD []  Lynann Beaver, PharmD []  Keturah Barre, PharmD []  Loralee Pacas, PharmD []  Bernadene Person, PharmD   Positive urine culture Treated with Cephalexin, organism sensitive to the same and no further patient follow-up is required at this time.  Howard Shepard 09/13/2022, 12:29 PM

## 2022-11-27 ENCOUNTER — Emergency Department (HOSPITAL_COMMUNITY): Payer: No Typology Code available for payment source

## 2022-11-27 ENCOUNTER — Emergency Department (HOSPITAL_COMMUNITY)
Admission: EM | Admit: 2022-11-27 | Discharge: 2022-12-01 | Disposition: A | Discharge status: Home / Self Care | Payer: No Typology Code available for payment source | Attending: Emergency Medicine | Admitting: Emergency Medicine

## 2022-11-27 DIAGNOSIS — R0902 Hypoxemia: Secondary | ICD-10-CM | POA: Diagnosis not present

## 2022-11-27 DIAGNOSIS — S0990XA Unspecified injury of head, initial encounter: Secondary | ICD-10-CM | POA: Insufficient documentation

## 2022-11-27 DIAGNOSIS — R739 Hyperglycemia, unspecified: Secondary | ICD-10-CM | POA: Diagnosis not present

## 2022-11-27 DIAGNOSIS — W19XXXA Unspecified fall, initial encounter: Secondary | ICD-10-CM | POA: Insufficient documentation

## 2022-11-27 DIAGNOSIS — M25561 Pain in right knee: Secondary | ICD-10-CM | POA: Insufficient documentation

## 2022-11-27 DIAGNOSIS — E119 Type 2 diabetes mellitus without complications: Secondary | ICD-10-CM | POA: Diagnosis not present

## 2022-11-27 DIAGNOSIS — Y92009 Unspecified place in unspecified non-institutional (private) residence as the place of occurrence of the external cause: Secondary | ICD-10-CM | POA: Insufficient documentation

## 2022-11-27 DIAGNOSIS — F0390 Unspecified dementia without behavioral disturbance: Secondary | ICD-10-CM | POA: Insufficient documentation

## 2022-11-27 DIAGNOSIS — Z7984 Long term (current) use of oral hypoglycemic drugs: Secondary | ICD-10-CM | POA: Insufficient documentation

## 2022-11-27 DIAGNOSIS — Z743 Need for continuous supervision: Secondary | ICD-10-CM | POA: Diagnosis not present

## 2022-11-27 LAB — CBC WITH DIFFERENTIAL/PLATELET
Abs Immature Granulocytes: 0.1 10*3/uL — ABNORMAL HIGH (ref 0.00–0.07)
Basophils Absolute: 0.1 10*3/uL (ref 0.0–0.1)
Basophils Relative: 1 %
Eosinophils Absolute: 0.3 10*3/uL (ref 0.0–0.5)
Eosinophils Relative: 2 %
HCT: 36.7 % — ABNORMAL LOW (ref 39.0–52.0)
Hemoglobin: 11.9 g/dL — ABNORMAL LOW (ref 13.0–17.0)
Immature Granulocytes: 1 %
Lymphocytes Relative: 18 %
Lymphs Abs: 2.2 10*3/uL (ref 0.7–4.0)
MCH: 29.6 pg (ref 26.0–34.0)
MCHC: 32.4 g/dL (ref 30.0–36.0)
MCV: 91.3 fL (ref 80.0–100.0)
Monocytes Absolute: 0.9 10*3/uL (ref 0.1–1.0)
Monocytes Relative: 8 %
Neutro Abs: 8.3 10*3/uL — ABNORMAL HIGH (ref 1.7–7.7)
Neutrophils Relative %: 70 %
Platelets: 403 10*3/uL — ABNORMAL HIGH (ref 150–400)
RBC: 4.02 MIL/uL — ABNORMAL LOW (ref 4.22–5.81)
RDW: 12.1 % (ref 11.5–15.5)
WBC: 11.8 10*3/uL — ABNORMAL HIGH (ref 4.0–10.5)
nRBC: 0 % (ref 0.0–0.2)

## 2022-11-27 LAB — COMPREHENSIVE METABOLIC PANEL
ALT: 12 U/L (ref 0–44)
AST: 18 U/L (ref 15–41)
Albumin: 2.5 g/dL — ABNORMAL LOW (ref 3.5–5.0)
Alkaline Phosphatase: 68 U/L (ref 38–126)
Anion gap: 12 (ref 5–15)
BUN: 11 mg/dL (ref 8–23)
CO2: 21 mmol/L — ABNORMAL LOW (ref 22–32)
Calcium: 8.4 mg/dL — ABNORMAL LOW (ref 8.9–10.3)
Chloride: 101 mmol/L (ref 98–111)
Creatinine, Ser: 1.04 mg/dL (ref 0.61–1.24)
GFR, Estimated: >60 mL/min (ref >60)
Glucose, Bld: 305 mg/dL — ABNORMAL HIGH (ref 70–99)
Potassium: 4 mmol/L (ref 3.5–5.1)
Sodium: 134 mmol/L — ABNORMAL LOW (ref 135–145)
Total Bilirubin: 0.4 mg/dL (ref 0.3–1.2)
Total Protein: 7 g/dL (ref 6.5–8.1)

## 2022-11-27 LAB — TROPONIN I (HIGH SENSITIVITY): Troponin I (High Sensitivity): 14 ng/L (ref <18)

## 2022-11-27 NOTE — ED Provider Notes (Signed)
  Provider Note MRN:  [id number]  Arrival date & time: 11/28/22    ED Course and Medical Decision Making  Assumed care from Dr. Criss Alvine at shift change.  Level 2 trauma fall on thinners awaiting CT imaging.  Possibly had some chest pain as well but no syncope.  Awaiting second troponin.  2:45 AM update: Second troponin negative.  Knee x-ray showing question fracture, follow-up CT knee without fracture.  Long discussion with family, they are very concerned about any discharge planning, lives with elderly wife who has back issues, cannot really take care of of him anymore.  No real indication for admission, will consult TOC for placement.  Signed out to default provider.  Procedures  Final Clinical Impressions(s) / ED Diagnoses     ICD-10-CM   1. Fall, initial encounter  W19.XXXA     2. Dementia, unspecified dementia severity, unspecified dementia type, unspecified whether behavioral, psychotic, or mood disturbance or anxiety (HCC)  F03.90       ED Discharge Orders     None       Discharge Instructions   None     Elmer Sow. Pilar Plate, MD [facility] Emergency Medicine [facility] [email or web address]    Sabas Sous, MD 11/28/22 [phone]

## 2022-11-27 NOTE — Progress Notes (Signed)
Orthopedic Tech Progress Note Patient Details:  Howard Shepard 28-Aug-1933 [id number]  Level 2 trauma   Patient ID: Howard Shepard, male   DOB: 10/01/1933, 89 y.o.   MRN: [id number]  Howard Shepard 11/27/2022, 9:48 PM

## 2022-11-27 NOTE — ED Provider Notes (Signed)
Howard Shepard EMERGENCY DEPARTMENT AT [facility] Provider Note   CSN: [id number] Arrival date & time: 11/27/22  2140     History  No chief complaint on file.   Howard Shepard is a 89 y.o. male.  HPI 89 year old male presents after a fall. He lives at home with his wife. Has a history of dementia, history is initially taken from EMS.  He had an unwitnessed fall and was found in the bathroom.  There is no sign of an head injury with EMS but wife was concerned about head injury because his head was propped up against the wall.  He is on Plavix.  He is a diabetic as well.  Patient's glucose was in the 300s.  He has dementia but is at his mental baseline.  Patient has chronic knee pain and is complaining of knee pain during the exam but otherwise has no complaints and tells me he feels fine.  I was able to talk to wife and son after their arrival.  Patient's been having a progressive mental decline from dementia over the last 6-8 months.  He is chronically weak and has a hard time walking and needs significant help at home.  She has a aide that comes 5 days a week.  Tonight he was on the toilet and she saw him slide off and hit his head on the wall but he did not lose consciousness.  Currently he was complaining of a little bit of chest pain to her.  He did not have any chest pain earlier in the night.  He has previously not been ill and not had any signs or symptoms of an infection.  No recent bleeding from his stools or black stools.  Home Medications Prior to Admission medications   Medication Sig Start Date End Date Taking? Authorizing Provider  acetaminophen (TYLENOL) 325 MG tablet Take 2 tablets (650 mg total) by mouth every 6 (six) hours as needed for mild pain (or Fever >/= 101). 02/07/17   Maxie Barb, MD  carvedilol (COREG) 12.5 MG tablet Take 12.5 mg by mouth 2 (two) times daily with a meal.    [provider]  cephALEXin (KEFLEX) 500 MG capsule Take 1  capsule (500 mg total) by mouth 4 (four) times daily. 09/10/22   Netta Corrigan, PA-C  clopidogrel (PLAVIX) 75 MG tablet Take 75 mg by mouth daily. 09/21/16   [provider]  Cyanocobalamin (VITAMIN B 12 PO) Take 1 tablet by mouth daily.    [provider]  donepezil (ARICEPT) 10 MG tablet Take 10 mg by mouth at bedtime.     [provider]  folic acid (FOLVITE) 800 MCG tablet Take 800 mcg by mouth daily.    [provider]  glipiZIDE (GLUCOTROL) 10 MG tablet Take 10 mg by mouth 2 (two) times daily before a meal.     [provider]  Magnesium 250 MG TABS Take 250 mg by mouth daily.    [provider]  methotrexate (RHEUMATREX) 2.5 MG tablet Take 7.5 mg by mouth See admin instructions. Take 7.5mg  by mouth Thursday morning and 7.5mg  by mouth Thursday evening    [provider]  Omega-3 Fatty Acids (FISH OIL PO) Take 1 capsule by mouth 2 (two) times daily.    [provider]  omeprazole (PRILOSEC) 20 MG capsule Take 20 mg by mouth daily.    [provider]  simvastatin (ZOCOR) 10 MG tablet Take 5 mg by  mouth daily at 6 PM.    [provider]  simvastatin (ZOCOR) 5 MG tablet Take 5 mg by mouth at bedtime. 12/30/18   [provider]  Tamsulosin HCl (FLOMAX) 0.4 MG CAPS Take 0.4 mg by mouth daily after supper.    [provider]      Allergies    Citalopram hydrobromide, Duloxetine hcl, Influenza vaccines, Metformin, Pneumococcal vac polyvalent, and Simvastatin    Review of Systems   Review of Systems  Unable to perform ROS: Dementia    Physical Exam Updated Vital Signs BP (!) 164/82   Pulse 93   Temp (!) 96.9 F (36.1 C)   Resp (!) 23   SpO2 96%  Physical Exam Vitals and nursing note reviewed.  Constitutional:      General: He is not in acute distress.    Appearance: He is well-developed. He is not ill-appearing or diaphoretic.  HENT:     Head: Normocephalic and atraumatic.      Comments: No obvious head injury. No tenderness. Cardiovascular:     Rate and Rhythm: Normal rate and regular rhythm.     Heart sounds: Normal heart sounds.  Pulmonary:     Effort: Pulmonary effort is normal.     Breath sounds: Normal breath sounds.  Abdominal:     Palpations: Abdomen is soft.     Tenderness: There is no abdominal tenderness.  Musculoskeletal:     Cervical back: No spinous process tenderness or muscular tenderness.     Right hip: No tenderness. Normal range of motion.     Left hip: No tenderness. Normal range of motion.     Right knee: Normal range of motion. Tenderness (mild) present.     Left knee: Normal range of motion. No tenderness.  Skin:    General: Skin is warm and dry.  Neurological:     Mental Status: He is alert.     ED Results / Procedures / Treatments   Labs (all labs ordered are listed, but only abnormal results are displayed) Labs Reviewed  COMPREHENSIVE METABOLIC PANEL - Abnormal; Notable for the following components:      Result Value   Sodium 134 (*)    CO2 21 (*)    Glucose, Bld 305 (*)    Calcium 8.4 (*)    Albumin 2.5 (*)    All other components within normal limits  CBC WITH DIFFERENTIAL/PLATELET - Abnormal; Notable for the following components:   WBC 11.8 (*)    RBC 4.02 (*)    Hemoglobin 11.9 (*)    HCT 36.7 (*)    Platelets 403 (*)    Neutro Abs 8.3 (*)    Abs Immature Granulocytes 0.10 (*)    All other components within normal limits  URINALYSIS, W/ REFLEX TO CULTURE (INFECTION SUSPECTED)  TROPONIN I (HIGH SENSITIVITY)  TROPONIN I (HIGH SENSITIVITY)    EKG None  Radiology No results found.  Procedures Procedures    Medications Ordered in ED Medications - No data to display  ED Course/ Medical Decision Making/ A&P                                 Medical Decision Making Amount and/or Complexity of Data Reviewed Labs: ordered. Radiology: ordered.   Patient presents after slumping over and hitting his  head on the wall.  No LOC.  Level 2 trauma due to being on Plavix.  Currently scans and  blood work pending.  Transiently complained of chest pain but I do not feel any obvious chest wall trauma or tenderness on exam. Care transferred to Dr. Pilar Plate.        Final Clinical Impression(s) / ED Diagnoses Final diagnoses:  None    Rx / DC Orders ED Discharge Orders     None         Pricilla Loveless, MD 11/27/22 2320

## 2022-11-27 NOTE — ED Triage Notes (Signed)
Unwitnessed fall at home, wife does not suspect loc. Pt missed toilet and head propped up against head so suspected he hit his head. Pt on plavix 152/60 P 80 96% Cbg 369

## 2022-11-27 NOTE — ED Notes (Signed)
Trauma Response Nurse Documentation   Howard Shepard is a 89 y.o. male arriving to [facility] via [facility]  On clopidogrel 75 mg daily. Trauma was activated as a Level 2 by Lester Kinsman RN based on the following trauma criteria Elderly patients > 65 with head trauma on anti-coagulation (excluding ASA).  Patient cleared for CT by Dr. Criss Alvine. Pt transported to CT with trauma response nurse present to monitor. RN remained with the patient throughout their absence from the department for clinical observation.   GCS 15.  History   Past Medical History:  Diagnosis Date   CHF (congestive heart failure) (HCC)    Diabetes mellitus without complication (HCC)    Hypertension      Past Surgical History:  Procedure Laterality Date   LEFT AND RIGHT HEART CATHETERIZATION WITH CORONARY ANGIOGRAM N/A 07/14/2011   Procedure: LEFT AND RIGHT HEART CATHETERIZATION WITH CORONARY ANGIOGRAM;  Surgeon: Pamella Pert, MD;  Location: [facility];  Service: Cardiovascular;  Laterality: N/A;       Initial Focused Assessment (If applicable, or please see trauma documentation): Airway-- intact, no visible obstruction Breathing-- spontaneous, unlabored Circulation-- no obvious bleeding noted  CT's Completed:   CT Head and CT C-Spine   Interventions:  See event summary  Plan for disposition:  {Trauma Dispo:26867}   Consults completed:  {Trauma Consults:26862} at ***.  Event Summary: Patient brought in by [facility]. Patient with and unwitnessed fall this evening. Wife states he may have struck his head on the toilet. Patient complaining of back pain and knee pain, which is reported by the wife to be chronic. On arrvial patient alert and oriented to baseline. Patient transferred from EMS stretcher to hospital stretcher. Manual BP obtained. 20 G PIV RAC established, lab work obtained. Patient to CT with TRN and primary RN. CT head, and c-spine completed. Patient back to trauma  bay at this time.   MTP Summary (If applicable):  N/A  Bedside handoff with ED RN Howard Shepard.    Leota Sauers  Trauma Response RN  Please call TRN at [phone] for further assistance.

## 2022-11-27 NOTE — ED Notes (Signed)
Patient transported to CT 

## 2022-11-28 ENCOUNTER — Emergency Department (HOSPITAL_COMMUNITY): Payer: No Typology Code available for payment source

## 2022-11-28 ENCOUNTER — Other Ambulatory Visit: Payer: Self-pay

## 2022-11-28 LAB — TROPONIN I (HIGH SENSITIVITY): Troponin I (High Sensitivity): 15 ng/L (ref <18)

## 2022-11-28 MED ORDER — TAMSULOSIN HCL 0.4 MG PO CAPS
0.4000 mg | ORAL_CAPSULE | Freq: Every day | ORAL | Status: DC
  Administered 2022-11-28 – 2022-11-30 (×3): 0.4 mg via ORAL
  Filled 2022-11-28 (×3): qty 1

## 2022-11-28 MED ORDER — FOLIC ACID 1 MG PO TABS
1.0000 mg | ORAL_TABLET | Freq: Every day | ORAL | Status: DC
  Administered 2022-11-28 – 2022-12-01 (×4): 1 mg via ORAL
  Filled 2022-11-28 (×4): qty 1

## 2022-11-28 MED ORDER — CLOPIDOGREL BISULFATE 75 MG PO TABS
75.0000 mg | ORAL_TABLET | Freq: Every day | ORAL | Status: DC
  Administered 2022-11-28 – 2022-12-01 (×4): 75 mg via ORAL
  Filled 2022-11-28 (×4): qty 1

## 2022-11-28 MED ORDER — DONEPEZIL HCL 5 MG PO TABS
10.0000 mg | ORAL_TABLET | Freq: Every day | ORAL | Status: DC
  Administered 2022-11-28 – 2022-11-30 (×3): 10 mg via ORAL
  Filled 2022-11-28 (×2): qty 2
  Filled 2022-11-28: qty 1

## 2022-11-28 MED ORDER — PANTOPRAZOLE SODIUM 40 MG PO TBEC
40.0000 mg | DELAYED_RELEASE_TABLET | Freq: Every day | ORAL | Status: DC
  Administered 2022-11-28 – 2022-12-01 (×4): 40 mg via ORAL
  Filled 2022-11-28 (×4): qty 1

## 2022-11-28 MED ORDER — ACETAMINOPHEN 325 MG PO TABS
650.0000 mg | ORAL_TABLET | Freq: Four times a day (QID) | ORAL | Status: DC | PRN
  Administered 2022-11-29 – 2022-11-30 (×3): 650 mg via ORAL
  Filled 2022-11-28 (×3): qty 2

## 2022-11-28 MED ORDER — SIMVASTATIN 5 MG PO TABS
5.0000 mg | ORAL_TABLET | Freq: Every day | ORAL | Status: DC
  Administered 2022-11-28 – 2022-11-30 (×3): 5 mg via ORAL
  Filled 2022-11-28 (×6): qty 1

## 2022-11-28 MED ORDER — CARVEDILOL 12.5 MG PO TABS
12.5000 mg | ORAL_TABLET | Freq: Two times a day (BID) | ORAL | Status: DC
  Administered 2022-11-28 – 2022-12-01 (×7): 12.5 mg via ORAL
  Filled 2022-11-28 (×7): qty 1

## 2022-11-28 MED ORDER — GLIPIZIDE 10 MG PO TABS
10.0000 mg | ORAL_TABLET | Freq: Two times a day (BID) | ORAL | Status: DC
  Administered 2022-11-28 – 2022-11-30 (×5): 10 mg via ORAL
  Filled 2022-11-28 (×9): qty 1

## 2022-11-28 MED ORDER — MAGNESIUM OXIDE -MG SUPPLEMENT 400 (240 MG) MG PO TABS
400.0000 mg | ORAL_TABLET | Freq: Every day | ORAL | Status: DC
  Administered 2022-11-28 – 2022-12-01 (×4): 400 mg via ORAL
  Filled 2022-11-28 (×4): qty 1

## 2022-11-28 NOTE — Progress Notes (Signed)
RE: Howard Shepard Date of Birth: 10/19/1933 Date: 11/28/22  Please be advised that the above-named patient has a primary diagnosis of dementia which supersedes any psychiatric diagnosis. Patient will require a short-term nursing home stay - anticipated 30 days or less for rehabilitation and strengthening.  The plan is for return home.

## 2022-11-28 NOTE — Evaluation (Signed)
Physical Therapy Evaluation Patient Details Name: Howard Shepard MRN: [id number] DOB: 11-15-1933 Today's Date: 11/28/2022  History of Present Illness  Pt is a 89yo male s/p unwitnessed fall off toliet at home with suspicion of hitting head. Imaging of head and knee negative. PMH: Dementia, DM, chronic knee pain   Clinical Impression  Pt admitted with above. Per chart, wife and son report progressive decline both functionally and cognitively over the last 6-106months. Pt currently requiring maxA and is unable tolerate mobility due to onset of back and L shld pain and decreased ability to comprehend cognitively due to known dementia. Pt lives with elderly wife who can not provide maximal assist pt currently needs. Pt to benefit from inpatient rehab < 3hrs a day to achieve an optimal level of function to transition to a memory care unit vs home with 24/7 pending patient's family's ability to physically care for patient. Acute PT to cont to follow.        If plan is discharge home, recommend the following: Two people to help with walking and/or transfers;Two people to help with bathing/dressing/bathroom;Assistance with cooking/housework;Assistance with feeding;Assist for transportation;Direct supervision/assist for financial management;Direct supervision/assist for medications management;Supervision due to cognitive status   Can travel by private vehicle   No    Equipment Recommendations  (TBD at next venue)  Recommendations for Other Services       Functional Status Assessment Patient has had a recent decline in their functional status and/or demonstrates limited ability to make significant improvements in function in a reasonable and predictable amount of time     Precautions / Restrictions Precautions Precautions: Fall Precaution Comments: dementia Restrictions Weight Bearing Restrictions: No      Mobility  Bed Mobility Overal bed mobility: Needs Assistance Bed Mobility:  Rolling Rolling: Max assist, Used rails         General bed mobility comments: attempted x 3 to roll to the L/R however pt resisted stating "oooooo no my back, my shoulder, it hurts stop" Unable to successfully complete logroll all the way over to sidelying in either direction. attempted to help with use of bad however pt continues to report pain.    Transfers                   General transfer comment: unable/unsafe to attempt this date    Ambulation/Gait               General Gait Details: unable to attempt this date  Stairs            Wheelchair Mobility     Tilt Bed    Modified Rankin (Stroke Patients Only)       Balance Overall balance assessment: Needs assistance, History of Falls     Sitting balance - Comments: unable to achieve sitting EOB                                     Pertinent Vitals/Pain Pain Assessment Pain Assessment: Faces Faces Pain Scale: Hurts worst Pain Location: L shoulder blade, back with attempt to roll L/R Pain Descriptors / Indicators: Grimacing Pain Intervention(s): Limited activity within patient's tolerance    Home Living Family/patient expects to be discharged to:: Unsure                   Additional Comments: per chart pt lives with his elderly wife, when patient asked who he lived  with he stated "I think my wife. I haven't seen her in awhile though." Per pt, 1 story home with basement but he doesn't go down there anymore because he doesn't have anyone to help him up/down the stairs    Prior Function Prior Level of Function : Needs assist             Mobility Comments: per patient he uses a RW at home, per chart pt with progressive decline in both mobility and cognition requiring increased assist for all mobility and ADLs ADLs Comments: needs assist     Extremity/Trunk Assessment   Upper Extremity Assessment Upper Extremity Assessment: Generalized weakness (able to raise bilat  UE to shld height bilaterally, limited with reaching across body due to pain)    Lower Extremity Assessment Lower Extremity Assessment: Generalized weakness (pt able to initiate hip/knee flexion in bed, limited R hip active flexion, able to complete ankle pumps)    Cervical / Trunk Assessment Cervical / Trunk Assessment: Other exceptions Cervical / Trunk Exceptions: report of pain with attempt to roll L/R  Communication   Communication Communication: Difficulty communicating thoughts/reduced clarity of speech (muffled due to not having teeth in)  Cognition Arousal: Alert Behavior During Therapy: Flat affect Overall Cognitive Status: History of cognitive impairments - at baseline                                 General Comments: pt oriented to self, birthdate and being in [place] including [place], [place]. pt unsure why he is in the hospital and unable to recall despite multiple attempts to explain situation. Pt with PMH of dementia and per chart family reports progressive decline over the last 6-8 months.        General Comments General comments (skin integrity, edema, etc.): bilat LE edema, RR 25-31 t/o eval    Exercises General Exercises - Lower Extremity Ankle Circles/Pumps: AROM, Both, Supine Heel Slides: AAROM, Both, 10 reps, Supine   Assessment/Plan    PT Assessment Patient needs continued PT services  PT Problem List Decreased strength;Decreased activity tolerance;Decreased range of motion;Decreased balance;Decreased mobility;Decreased coordination;Decreased cognition;Decreased knowledge of use of DME;Decreased safety awareness;Pain       PT Treatment Interventions DME instruction;Gait training;Functional mobility training;Therapeutic activities;Therapeutic exercise;Balance training;Neuromuscular re-education;Cognitive remediation    PT Goals (Current goals can be found in the Care Plan section)  Acute Rehab PT Goals Patient Stated Goal: didn't state PT  Goal Formulation: Patient unable to participate in goal setting Time For Goal Achievement: 12/12/22 Potential to Achieve Goals: Fair    Frequency Min 1X/week     Co-evaluation               AM-PAC PT "6 Clicks" Mobility  Outcome Measure Help needed turning from your back to your side while in a flat bed without using bedrails?: Total Help needed moving from lying on your back to sitting on the side of a flat bed without using bedrails?: Total Help needed moving to and from a bed to a chair (including a wheelchair)?: Total Help needed standing up from a chair using your arms (e.g., wheelchair or bedside chair)?: Total Help needed to walk in hospital room?: Total Help needed climbing 3-5 steps with a railing? : Total 6 Click Score: 6    End of Session   Activity Tolerance: Patient limited by pain Patient left: in bed;with call bell/phone within reach Nurse Communication: Mobility status PT Visit  Diagnosis: Muscle weakness (generalized) (M62.81);History of falling (Z91.81)    Time: 1610-9604 PT Time Calculation (min) (ACUTE ONLY): 22 min   Charges:   PT Evaluation $PT Eval Moderate Complexity: 1 Mod   PT General Charges $$ ACUTE PT VISIT: 1 Visit         Lewis Shock, PT, DPT Acute Rehabilitation Services Secure chat preferred Office #: [phone]   Iona Hansen 11/28/2022, 8:41 AM

## 2022-11-28 NOTE — [place] FL2 (Signed)
[place] MEDICAID FL2 LEVEL OF CARE FORM     IDENTIFICATION  Patient Name: Howard Shepard Birthdate: [birth year] Sex: male Admission Date (Current Location): 11/27/2022  [facility] and [place] Number:  Producer, television/film/video and Address:  [facility], [address] [place], [place] [address]      Provider Number: [id number]  Attending Physician Name and Address:  System, Provider Not In  Relative Name and Phone Number:  Ms Younglove daughter [phone]    Current Level of Care: Other (Comment) (ED holding) Recommended Level of Care: Skilled Nursing Facility Prior Approval Number:  (Awaiting PASSR)  Date Approved/Denied:   PASRR Number:    Discharge Plan: SNF    Current Diagnoses: Patient Active Problem List   Diagnosis Date Noted   Alzheimer disease (HCC) 12/05/2020   Vertigo 12/05/2020   Abnormal gait 12/05/2020   Abnormal liver function 12/05/2020   Acute renal failure syndrome (HCC) 12/05/2020   Amnesia 12/05/2020   Bronchitis 12/05/2020   Cardiomyopathy (HCC) 12/05/2020   Chronic pain 12/05/2020   CKD (chronic kidney disease) stage 3, GFR 30-59 ml/min (HCC) 12/05/2020   Contusion of toe 12/05/2020   Cough 12/05/2020   Acute left ankle pain    Weakness 02/05/2017   Diabetes mellitus type 2 in nonobese (HCC) 02/05/2017   Acute cystitis without hematuria    SHINGLES 07/16/2008   LUMBAR RADICULOPATHY, LEFT 04/30/2008   ABDOMINAL PAIN, LEFT LOWER QUADRANT 09/07/2007   HYPERLIPIDEMIA 02/28/2007   Essential hypertension 02/28/2007   GERD 02/28/2007   SKIN LESIONS, MULTIPLE 02/28/2007   DIABETES MELLITUS, TYPE II 09/28/2006   DEPRESSION 09/28/2006   SLEEP APNEA, OBSTRUCTIVE 09/28/2006   Congestive heart failure (HCC) 09/28/2006   HEMORRHOIDS, INTERNAL, WITH BLEEDING 09/28/2006   ALLERGIC RHINITIS 09/28/2006   Peptic ulcer, unspecified site, unspecified as acute or chronic, without mention of hemorrhage, perforation, or obstruction  09/28/2006   ARTHRITIS, RHEUMATOID 09/28/2006   LOW BACK PAIN 09/28/2006   URINARY INCONTINENCE 09/28/2006   TRANSIENT ISCHEMIC ATTACK, HX OF 09/28/2006   History of colonic polyps 09/28/2006   Personal history of urinary calculi 09/28/2006    Orientation RESPIRATION BLADDER Height & Weight     Self  Normal Continent Weight:   Height:     BEHAVIORAL SYMPTOMS/MOOD NEUROLOGICAL BOWEL NUTRITION STATUS      Continent Diet (Please see DC summary)  AMBULATORY STATUS COMMUNICATION OF NEEDS Skin   Supervision Verbally Normal                       Personal Care Assistance Level of Assistance  Bathing, Feeding, Dressing Bathing Assistance: Maximum assistance Feeding assistance: Limited assistance Dressing Assistance: Maximum assistance     Functional Limitations Info  Sight Sight Info: Impaired        SPECIAL CARE FACTORS FREQUENCY  PT (By licensed PT), OT (By licensed OT)     PT Frequency: 5 x week OT Frequency: 5X a week            Contractures Contractures Info: Not present    Additional Factors Info  Code Status Code Status Info: Full Code             Current Medications (11/28/2022):  This is the current hospital active medication list Current Facility-Administered Medications  Medication Dose Route Frequency Provider Last Rate Last Admin   acetaminophen (TYLENOL) tablet 650 mg  650 mg Oral Q6H PRN Sabas Sous, MD       carvedilol (  COREG) tablet 12.5 mg  12.5 mg Oral BID WC Sabas Sous, MD       clopidogrel (PLAVIX) tablet 75 mg  75 mg Oral Daily Sabas Sous, MD       donepezil (ARICEPT) tablet 10 mg  10 mg Oral QHS Sabas Sous, MD       folic acid (FOLVITE) tablet 1 mg  1 mg Oral Daily Sabas Sous, MD       glipiZIDE (GLUCOTROL) tablet 10 mg  10 mg Oral BID AC Sabas Sous, MD       magnesium oxide (MAG-OX) tablet 400 mg  400 mg Oral Daily Sabas Sous, MD       pantoprazole (PROTONIX) EC tablet 40 mg  40 mg Oral Daily Sabas Sous, MD       simvastatin (ZOCOR) tablet 5 mg  5 mg Oral QHS Sabas Sous, MD       tamsulosin [facility]) capsule 0.4 mg  0.4 mg Oral QPC supper Sabas Sous, MD       Current Outpatient Medications  Medication Sig Dispense Refill   acetaminophen (TYLENOL) 325 MG tablet Take 2 tablets (650 mg total) by mouth every 6 (six) hours as needed for mild pain (or Fever >/= 101).     carvedilol (COREG) 12.5 MG tablet Take 12.5 mg by mouth 2 (two) times daily with a meal.     cephALEXin (KEFLEX) 500 MG capsule Take 1 capsule (500 mg total) by mouth 4 (four) times daily. 28 capsule 0   clopidogrel (PLAVIX) 75 MG tablet Take 75 mg by mouth daily.     Cyanocobalamin (VITAMIN B 12 PO) Take 1 tablet by mouth daily.     donepezil (ARICEPT) 10 MG tablet Take 10 mg by mouth at bedtime.      folic acid (FOLVITE) 800 MCG tablet Take 800 mcg by mouth daily.     glipiZIDE (GLUCOTROL) 10 MG tablet Take 10 mg by mouth 2 (two) times daily before a meal.      Magnesium 250 MG TABS Take 250 mg by mouth daily.     methotrexate (RHEUMATREX) 2.5 MG tablet Take 7.5 mg by mouth See admin instructions. Take 7.5mg  by mouth Thursday morning and 7.5mg  by mouth Thursday evening     Omega-3 Fatty Acids (FISH OIL PO) Take 1 capsule by mouth 2 (two) times daily.     omeprazole (PRILOSEC) 20 MG capsule Take 20 mg by mouth daily.     simvastatin (ZOCOR) 10 MG tablet Take 5 mg by mouth daily at 6 PM.     simvastatin (ZOCOR) 5 MG tablet Take 5 mg by mouth at bedtime.     Tamsulosin HCl (FLOMAX) 0.4 MG CAPS Take 0.4 mg by mouth daily after supper.       Discharge Medications: Please see discharge summary for a list of discharge medications.  Relevant Imaging Results:  Relevant Lab Results:   Additional Information SSN [id number]  Lockie Pares, RN

## 2022-11-28 NOTE — Care Management (Signed)
Transition of Care [facility]) - Emergency Department Mini Assessment   Patient Details  Name: Howard Shepard MRN: [id number] Date of Birth: Jul 12, 1933  [facility]) CM/SW Contact:    Lockie Pares, RN Phone Number: 11/28/2022, 8:54 AM   Clinical Narrative: Patient presented to ED after a fall on thinners. Spoke to daughter who is caregiver as well as wife. His wife is getting more debilitated and can not adequately care for him according to daughter. Daughter MS Bagnall oversees medications, and has caregivers come in, however yesterday the caregiver called out.  They are looking for placement for strengthening. He is under [employer]. They prefer [facility] or [facility]. PT order placed for assessment. Add MS Cronic to contacts. She states she is POA.   ED Mini Assessment: What brought you to the Emergency Department? : Fall on thinners  Barriers to Discharge: Continued Medical Work up  [employer] interventions: PT consult          Patient Contact and Communications Key Contact 1: Daughter Ms Klemens [phone]      ,                 Admission diagnosis:  FOT Patient Active Problem List   Diagnosis Date Noted   Alzheimer disease (HCC) 12/05/2020   Vertigo 12/05/2020   Abnormal gait 12/05/2020   Abnormal liver function 12/05/2020   Acute renal failure syndrome (HCC) 12/05/2020   Amnesia 12/05/2020   Bronchitis 12/05/2020   Cardiomyopathy (HCC) 12/05/2020   Chronic pain 12/05/2020   CKD (chronic kidney disease) stage 3, GFR 30-59 ml/min (HCC) 12/05/2020   Contusion of toe 12/05/2020   Cough 12/05/2020   Acute left ankle pain    Weakness 02/05/2017   Diabetes mellitus type 2 in nonobese (HCC) 02/05/2017   Acute cystitis without hematuria    SHINGLES 07/16/2008   LUMBAR RADICULOPATHY, LEFT 04/30/2008   ABDOMINAL PAIN, LEFT LOWER QUADRANT 09/07/2007   HYPERLIPIDEMIA 02/28/2007   Essential hypertension 02/28/2007   GERD 02/28/2007   SKIN LESIONS,  MULTIPLE 02/28/2007   DIABETES MELLITUS, TYPE II 09/28/2006   DEPRESSION 09/28/2006   SLEEP APNEA, OBSTRUCTIVE 09/28/2006   Congestive heart failure (HCC) 09/28/2006   HEMORRHOIDS, INTERNAL, WITH BLEEDING 09/28/2006   ALLERGIC RHINITIS 09/28/2006   Peptic ulcer, unspecified site, unspecified as acute or chronic, without mention of hemorrhage, perforation, or obstruction 09/28/2006   ARTHRITIS, RHEUMATOID 09/28/2006   LOW BACK PAIN 09/28/2006   URINARY INCONTINENCE 09/28/2006   TRANSIENT ISCHEMIC ATTACK, HX OF 09/28/2006   History of colonic polyps 09/28/2006   Personal history of urinary calculi 09/28/2006   PCP:  [facility]:   [id number] - [address], [place] - [address] [place] [address] Phone: [phone] Fax: [phone]  [facility] [address] [place] [address] Phone: [phone] Fax: [phone]

## 2022-11-28 NOTE — ED Provider Notes (Signed)
Emergency Medicine Observation Re-evaluation Note  Howard Shepard is a 89 y.o. male, seen on rounds today.  Pt initially presented to the ED for complaints of Fall Currently, the patient is resting.  Physical Exam  BP (!) 157/86   Pulse 73   Temp 97.9 F (36.6 C) (Oral)   Resp (!) 25   SpO2 93%  Physical Exam General: NAD Cardiac: well perfused Lungs: unlabored Psych: no agitation  ED Course / MDM  EKG:   I have reviewed the labs performed to date as well as medications administered while in observation.  Recent changes in the last 24 hours include seen by PT:  "Pt currently requiring maxA and is unable tolerate mobility due to onset of back and L shld pain and decreased ability to comprehend cognitively due to known dementia. Pt lives with elderly wife who can not provide maximal assist pt currently needs. Pt to benefit from inpatient rehab < 3hrs a day to achieve an optimal level of function to transition to a memory care unit vs home with 24/7 pending patient's family's ability to physically care for patient. Acute PT to cont to follow. "  Plan  Current plan is for SNF placement, SW coordinating.    Ernie Avena, MD 11/28/22 1256

## 2022-11-29 LAB — URINALYSIS, W/ REFLEX TO CULTURE (INFECTION SUSPECTED)
Bacteria, UA: NONE SEEN
Bilirubin Urine: NEGATIVE
Glucose, UA: 150 mg/dL — AB
Hgb urine dipstick: NEGATIVE
Ketones, ur: NEGATIVE mg/dL
Leukocytes,Ua: NEGATIVE
Nitrite: NEGATIVE
Protein, ur: NEGATIVE mg/dL
Specific Gravity, Urine: 1.012 (ref 1.005–1.030)
pH: 7 (ref 5.0–8.0)

## 2022-11-29 MED ORDER — LORAZEPAM 2 MG/ML IJ SOLN
1.0000 mg | Freq: Once | INTRAMUSCULAR | Status: AC
  Administered 2022-11-29: 1 mg via INTRAMUSCULAR
  Filled 2022-11-29: qty 1

## 2022-11-29 MED ORDER — MICONAZOLE NITRATE 2 % EX CREA
1.0000 | TOPICAL_CREAM | Freq: Two times a day (BID) | CUTANEOUS | Status: DC
  Administered 2022-11-30 (×2): 1 via TOPICAL
  Filled 2022-11-29 (×3): qty 28.4

## 2022-11-29 NOTE — ED Notes (Signed)
Pt transferred from ED stretcher to hospital ped. Linens and absorbent pad changed. Brief changed and percare provided. Redness noted to pts sacrum. Warm blankets provided. Bed alarm set.

## 2022-11-29 NOTE — TOC CAGE-AID Note (Signed)
Transition of Care [facility]) - CAGE-AID Screening   Patient Details  Name: Howard Shepard MRN: [id number] Date of Birth: 10-22-1933  Transition of Care [facility]) CM/SW Contact:    Janora Norlander, RN Phone Number: [phone] 11/29/2022, 4:21 PM   Clinical Narrative: Pt here after falling and now needs placement in a SNF.  Pt has elderly wife at home who is unable to provide care for him now.  Pt has baseline dementia therefore is unable to participate in screening.   CAGE-AID Screening: Substance Abuse Screening unable to be completed due to: : Patient unable to participate

## 2022-11-29 NOTE — ED Provider Notes (Signed)
Emergency Medicine Observation Re-evaluation Note  Howard Shepard is a 89 y.o. male, seen on rounds today.  Pt initially presented to the ED for complaints of Fall Currently, the patient is resting.  Physical Exam  BP (!) 159/68 (BP Location: Left Arm)   Pulse 69   Temp (!) 97.4 F (36.3 C) (Oral)   Resp 20   Ht 5\' 7"  (1.702 m)   Wt 74.8 kg   SpO2 95%   BMI 25.83 kg/m  Physical Exam General: NAD Cardiac: well perfused Lungs: unlabored Psych: no agitation  ED Course / MDM  EKG:   I have reviewed the labs performed to date as well as medications administered while in observation.  Recent changes in the last 24 hours include seen by PT:  "Pt currently requiring maxA and is unable tolerate mobility due to onset of back and L shld pain and decreased ability to comprehend cognitively due to known dementia. Pt lives with elderly wife who can not provide maximal assist pt currently needs. Pt to benefit from inpatient rehab < 3hrs a day to achieve an optimal level of function to transition to a memory care unit vs home with 24/7 pending patient's family's ability to physically care for patient. Acute PT to cont to follow. "  Discussed care of the pt with his daughter bedside. He does have a hx of worsening dementia and decline in the setting of a UTI. UA has been ordered but not collected. Nursing engaged for urine collection.  UA: negative for UTI.  Plan  Current plan is for SNF placement, SW coordinating.        Ernie Avena, MD 11/29/22 Mikle Bosworth

## 2022-11-29 NOTE — ED Notes (Signed)
Pt is resting now and appears to be comfortable and in no distress.

## 2022-11-29 NOTE — Progress Notes (Addendum)
Pt has VA insurnace and will have to get authorization through the [place] transfer office for SNF placement. The VA contracts with specific SNF facilities , CSW called April VA Transfer Coordinator to start SNF process ,no answer Left VM requesting a return call. TOC to follow.    Adden  2:50pm Clinicals was uploaded to [place] MUST, PASRR pending. TOC to follow.  Valentina Shaggy.Dannell Raczkowski, MSW, LCSWA [facility]  Transitions of Care Clinical Social Worker I Direct Dial: [phone]  Fax: [phone] [email or web address]

## 2022-11-29 NOTE — ED Notes (Signed)
Per doctor's order, straight cath'd pt for urine sample. Techs changed pt's brief. Pt was hollering and cursing at the staff, but stopped when the catheter was removed.

## 2022-11-29 NOTE — ED Notes (Signed)
Report given to [facility] in purple.

## 2022-11-30 LAB — CBG MONITORING, ED
Glucose-Capillary: 367 mg/dL — ABNORMAL HIGH (ref 70–99)
Glucose-Capillary: 227 mg/dL — ABNORMAL HIGH (ref 70–99)
Glucose-Capillary: 151 mg/dL — ABNORMAL HIGH (ref 70–99)

## 2022-11-30 MED ORDER — INSULIN ASPART 100 UNIT/ML IJ SOLN
0.0000 [IU] | Freq: Three times a day (TID) | INTRAMUSCULAR | Status: DC
  Administered 2022-11-30: 9 [IU] via SUBCUTANEOUS
  Administered 2022-11-30: 3 [IU] via SUBCUTANEOUS
  Administered 2022-12-01 (×2): 2 [IU] via SUBCUTANEOUS

## 2022-11-30 MED ORDER — INSULIN ASPART 100 UNIT/ML IJ SOLN: 0.0000 [IU] | Freq: Every day | INTRAMUSCULAR | Status: DC

## 2022-11-30 NOTE — TOC Progression Note (Signed)
Transition of Care [facility]) - Progression Note    Patient Details  Name: Howard Shepard MRN: [id number] Date of Birth: 03/16/1933  Transition of Care [facility]) CM/SW Contact  Susa Simmonds, [place] Phone Number: 11/30/2022, 6:23 PM  Clinical Narrative: CSW spoke with patients wife and daughter Darlyne Russian. Patients wife stated she would like to use patients VA insurance. CSW explained that CSW will have to verify if patient is service connected and if not patient will need to use his united healthcare. Patients daughter stated she would like her father to go to [facility] or [facility]. Patients daughter feels [facility] will be too far of a drive for her mother. CSW told patients daughter that [facility] and [facility] do not take [employer]. Patients daughter asked CSW to call and verify. CSW spoke with Alvino Chapel in admissions at [facility] and [place] in admissions at [facility]. The facilities do not take [employer].   2:45 PM- CSW received information from the [place] informing CSW that patient is only 50% service connected. Patient would need to be at least 70% service connected for SNF placement.   2:54 PM- CSW spoke with [place] in admissions to see if she could review patient in the hub using his [facility] insurance.   3:00 PM- CSW informed patients daughter that patient is only 50% service connected. Patients daughter would like to proceed with [facility] and if [facility] to go ahead and start the insurance authorization   6:00 PM- Patients SNF referral accepted for [facility].   7:00 PM- Insurance authorization was started in [place] and approved [id number]. CSW will contacted admissions in the morning to discharge patient.        Expected Discharge Plan: Skilled Nursing Facility Barriers to Discharge: Continued Medical Work up  Expected Discharge Plan and Services                                               Social Determinants of Health (SDOH) Interventions SDOH Screenings   Tobacco Use: Medium Risk  (09/08/2022)    Readmission Risk Interventions     No data to display

## 2022-11-30 NOTE — Progress Notes (Signed)
Pasrr: [id number]

## 2022-11-30 NOTE — Discharge Planning (Signed)
Pt active at [facility] SW: Lynden Ang  Desk: [phone]  Please call VA transfer coordinator for additional information [phone]   Pt is 50% service connected per Bonita Quin, Clinical cytogeneticist

## 2022-11-30 NOTE — Progress Notes (Signed)
SNF referral faxed to the [facility] for approval

## 2022-11-30 NOTE — ED Provider Notes (Addendum)
  Physical Exam  BP (!) 156/78 (BP Location: Right Arm)   Pulse 72   Temp (!) 97.4 F (36.3 C) (Oral)   Resp 17   Ht 5\' 7"  (1.702 m)   Wt 74.8 kg   SpO2 94%   BMI 25.83 kg/m   Physical Exam  Procedures  Procedures  ED Course / MDM    Medical Decision Making Amount and/or Complexity of Data Reviewed Labs: ordered. Radiology: ordered.  Risk OTC drugs. Prescription drug management.   Patient is pending skilled nursing placement.  Medically cleared.  Urinalysis did not show infection.       Benjiman Core, MD 11/30/22 0824  Sugars 367.  Diabetes coordinator has put in recommendations that I will order.    Benjiman Core, MD 11/30/22 1037

## 2022-11-30 NOTE — Inpatient Diabetes Management (Signed)
Inpatient Diabetes Program Recommendations  AACE/ADA: New Consensus Statement on Inpatient Glycemic Control  Target Ranges:  Prepandial:   less than 140 mg/dL      [facility] postprandial:   less than 180 mg/dL (1-2 hours)      Critically ill patients:  140 - 180 mg/dL     Latest Reference Range & Units 11/30/22 10:22  Glucose-Capillary 70 - 99 mg/dL 782 (H)   Review of Glycemic Control  Diabetes history: DM2 Outpatient Diabetes medications: Glipizide 10 mg BID Current orders for Inpatient glycemic control: Glipizide 10 mg BID  Inpatient Diabetes Program Recommendations:    Insulin: CBG 367 mg/dl today at 95:62 am.  Please consider ordering CBGs AC&HS and Novolog 0-9 units TID with meals and Novolog 0-5 units at bedtime.  Diet: Please consider discontinuing Regular diet and ordering Carb Modified diet if appropriate.   Thanks, Orlando Penner, RN, MSN, CDCES Diabetes Coordinator Inpatient Diabetes Program [phone] (Team Pager from 8am to 5pm)

## 2022-11-30 NOTE — Progress Notes (Signed)
Physical Therapy Treatment Patient Details Name: Howard Shepard MRN: [id number] DOB: 11/17/1933 Today's Date: 11/30/2022   History of Present Illness Pt is a 89yo male s/p unwitnessed fall off toliet at home with suspicion of hitting head. Imaging of head and knee negative. PMH: Dementia, DM, chronic knee pain    PT Comments  Pt tolerates treatment well, progressing to transfer and gait training. Pt continues to require physical assistance to stand and ambulate. Pt demonstrates awareness of continued LE weakness, reporting he feels his legs may give out. Pt will benefit from continued frequent mobilization in an effort to improve stability and reduce falls risk. PT continues to recommend short term inpatient PT services at the time of discharge.    If plan is discharge home, recommend the following: A lot of help with walking and/or transfers;A lot of help with bathing/dressing/bathroom;Assistance with cooking/housework;Assist for transportation;Help with stairs or ramp for entrance;Direct supervision/assist for medications management;Direct supervision/assist for financial management;Supervision due to cognitive status   Can travel by private vehicle     No  Equipment Recommendations  Wheelchair (measurements PT);BSC/3in1    Recommendations for Other Services       Precautions / Restrictions Precautions Precautions: Fall Precaution Comments: dementia Restrictions Weight Bearing Restrictions: No     Mobility  Bed Mobility Overal bed mobility: Needs Assistance Bed Mobility: Supine to Sit, Sit to Supine     Supine to sit: Mod assist, HOB elevated Sit to supine: Min assist        Transfers Overall transfer level: Needs assistance Equipment used: Rolling walker (2 wheels) Transfers: Sit to/from Stand Sit to Stand: Min assist, From elevated surface           General transfer comment: verbal cues to increase trunk flexion, pt retains in subsequent transfer attempt     Ambulation/Gait Ambulation/Gait assistance: Min assist Gait Distance (Feet): 20 Feet Assistive device: Rolling walker (2 wheels) Gait Pattern/deviations: Step-to pattern Gait velocity: reduced Gait velocity interpretation: <1.8 ft/sec, indicate of risk for recurrent falls   General Gait Details: slowed step-to gait, reduced stride length and gait speed   Stairs             Wheelchair Mobility     Tilt Bed    Modified Rankin (Stroke Patients Only)       Balance Overall balance assessment: Needs assistance Sitting-balance support: Feet supported, No upper extremity supported Sitting balance-Leahy Scale: Good     Standing balance support: Single extremity supported, Reliant on assistive device for balance Standing balance-Leahy Scale: Poor                              Cognition Arousal: Alert Behavior During Therapy: WFL for tasks assessed/performed Overall Cognitive Status: History of cognitive impairments - at baseline                                 General Comments: pt is oriented to person and place. Pt has fair awareness of his deficitis at this time, specifically LE weakness        Exercises      General Comments General comments (skin integrity, edema, etc.): VSS on RA      Pertinent Vitals/Pain Pain Assessment Pain Assessment: Faces Faces Pain Scale: Hurts even more Pain Location: L shoulder Pain Descriptors / Indicators: Grimacing Pain Intervention(s): Monitored during session    Home Living  Prior Function            PT Goals (current goals can now be found in the care plan section) Acute Rehab PT Goals Patient Stated Goal: to don pants Progress towards PT goals: Progressing toward goals    Frequency    Min 1X/week      PT Plan      Co-evaluation              AM-PAC PT "6 Clicks" Mobility   Outcome Measure  Help needed turning from your back to your  side while in a flat bed without using bedrails?: A Little Help needed moving from lying on your back to sitting on the side of a flat bed without using bedrails?: A Lot Help needed moving to and from a bed to a chair (including a wheelchair)?: A Lot Help needed standing up from a chair using your arms (e.g., wheelchair or bedside chair)?: A Lot Help needed to walk in hospital room?: A Little Help needed climbing 3-5 steps with a railing? : Total 6 Click Score: 13    End of Session Equipment Utilized During Treatment: Gait belt Activity Tolerance: Patient tolerated treatment well Patient left: in bed;with call bell/phone within reach;with family/visitor present;with bed alarm set Nurse Communication: Mobility status PT Visit Diagnosis: Muscle weakness (generalized) (M62.81)     Time: 1245-1310 PT Time Calculation (min) (ACUTE ONLY): 25 min  Charges:    $Gait Training: 8-22 mins $Therapeutic Activity: 8-22 mins PT General Charges $$ ACUTE PT VISIT: 1 Visit                     Arlyss Gandy, PT, DPT Acute Rehabilitation Office [phone]    Arlyss Gandy 11/30/2022, 1:24 PM

## 2022-12-01 DIAGNOSIS — R262 Difficulty in walking, not elsewhere classified: Secondary | ICD-10-CM | POA: Diagnosis not present

## 2022-12-01 DIAGNOSIS — R278 Other lack of coordination: Secondary | ICD-10-CM | POA: Diagnosis not present

## 2022-12-01 DIAGNOSIS — G308 Other Alzheimer's disease: Secondary | ICD-10-CM | POA: Diagnosis not present

## 2022-12-01 DIAGNOSIS — E1122 Type 2 diabetes mellitus with diabetic chronic kidney disease: Secondary | ICD-10-CM | POA: Diagnosis not present

## 2022-12-01 DIAGNOSIS — Z9181 History of falling: Secondary | ICD-10-CM | POA: Diagnosis not present

## 2022-12-01 DIAGNOSIS — D649 Anemia, unspecified: Secondary | ICD-10-CM | POA: Diagnosis not present

## 2022-12-01 DIAGNOSIS — M545 Low back pain, unspecified: Secondary | ICD-10-CM | POA: Diagnosis not present

## 2022-12-01 DIAGNOSIS — I5020 Unspecified systolic (congestive) heart failure: Secondary | ICD-10-CM | POA: Diagnosis not present

## 2022-12-01 DIAGNOSIS — M6259 Muscle wasting and atrophy, not elsewhere classified, multiple sites: Secondary | ICD-10-CM | POA: Diagnosis not present

## 2022-12-01 DIAGNOSIS — R269 Unspecified abnormalities of gait and mobility: Secondary | ICD-10-CM | POA: Diagnosis not present

## 2022-12-01 DIAGNOSIS — G309 Alzheimer's disease, unspecified: Secondary | ICD-10-CM | POA: Diagnosis not present

## 2022-12-01 DIAGNOSIS — M059 Rheumatoid arthritis with rheumatoid factor, unspecified: Secondary | ICD-10-CM | POA: Diagnosis not present

## 2022-12-01 DIAGNOSIS — Z743 Need for continuous supervision: Secondary | ICD-10-CM | POA: Diagnosis not present

## 2022-12-01 DIAGNOSIS — M6281 Muscle weakness (generalized): Secondary | ICD-10-CM | POA: Diagnosis not present

## 2022-12-01 DIAGNOSIS — E119 Type 2 diabetes mellitus without complications: Secondary | ICD-10-CM | POA: Diagnosis not present

## 2022-12-01 DIAGNOSIS — S0990XA Unspecified injury of head, initial encounter: Secondary | ICD-10-CM | POA: Diagnosis not present

## 2022-12-01 DIAGNOSIS — R531 Weakness: Secondary | ICD-10-CM | POA: Diagnosis not present

## 2022-12-01 DIAGNOSIS — I10 Essential (primary) hypertension: Secondary | ICD-10-CM | POA: Diagnosis not present

## 2022-12-01 DIAGNOSIS — N183 Chronic kidney disease, stage 3 unspecified: Secondary | ICD-10-CM | POA: Diagnosis not present

## 2022-12-01 LAB — CBG MONITORING, ED
Glucose-Capillary: 191 mg/dL — ABNORMAL HIGH (ref 70–99)
Glucose-Capillary: 180 mg/dL — ABNORMAL HIGH (ref 70–99)

## 2022-12-01 NOTE — ED Notes (Signed)
Patient refuse to eat lunch

## 2022-12-01 NOTE — ED Notes (Signed)
Report was called to Aggie Cosier , LPN at [facility]

## 2022-12-01 NOTE — Discharge Instructions (Signed)
Thank you for coming to [facility] Emergency Department. You were seen for fall. We did an exam, labs, and imaging, and these showed no acute findings. You are being transferred to [facility] facility.  Please follow up with your primary care provider within 1 week.   Please check your blood sugars 4 times per day and continue to take your glipizide. Intermittently your blood sugar was high while in the ED. You may need to have adjustments made to your diabetes medication regimen - please discuss this with your primary doctor within 1 week.   Do not hesitate to return to the ED or call 911 if you experience: -Worsening symptoms -Lightheadedness, passing out -Fevers/chills -Anything else that concerns you

## 2022-12-01 NOTE — ED Notes (Signed)
RN try to feed patient he said he spit the food out. Patient stated "I do not want any thing"

## 2022-12-01 NOTE — ED Provider Notes (Signed)
Emergency Medicine Observation Re-evaluation Note  Howard Shepard is a 89 y.o. male, seen on rounds today.  89yo male s/p unwitnessed fall off toliet at home with suspicion of hitting head. Imaging of head and knee negative. PMH: Dementia, DM, chronic knee pain. Wife and family cannot care for patient at home. PT evaluated with recommendations for maxA and inpatient therapy/rehab.   Currently, the patient is calm, lying in bed. He reports that he would like to eat some breakfast now.  Physical Exam  BP (!) 145/72 (BP Location: Left Arm)   Pulse 80   Temp (!) 97.5 F (36.4 C) (Oral)   Resp 18   Ht 5\' 7"  (1.702 m)   Wt 74.8 kg   SpO2 93%   BMI 25.83 kg/m  Physical Exam General: lying in bed, resting Cardiac: well perfused Lungs: no resp distress Psych: calm, cooperative  ED Course / MDM  EKG:   I have reviewed the labs performed to date as well as medications administered while in observation.  Recent changes in the last 24 hours include CBG this AM 191 mg/dL.  Plan  Current plan is for discharge to [facility]. DC w/ discharge instructions/return precautions.     Loetta Rough, MD 12/01/22 1003

## 2022-12-01 NOTE — ED Notes (Signed)
[facility] called, eta "within the hour"

## 2022-12-01 NOTE — Progress Notes (Signed)
Patient discharging to [facility] rehab. Patient will be going too room 305A, number for report is [phone]. Information provided to patients nurse. CSW notified patients daughter Darlyne Russian.

## 2022-12-02 DIAGNOSIS — N183 Chronic kidney disease, stage 3 unspecified: Secondary | ICD-10-CM | POA: Diagnosis not present

## 2022-12-02 DIAGNOSIS — G309 Alzheimer's disease, unspecified: Secondary | ICD-10-CM | POA: Diagnosis not present

## 2022-12-02 DIAGNOSIS — E1122 Type 2 diabetes mellitus with diabetic chronic kidney disease: Secondary | ICD-10-CM | POA: Diagnosis not present

## 2022-12-02 DIAGNOSIS — Z9181 History of falling: Secondary | ICD-10-CM | POA: Diagnosis not present

## 2022-12-02 DIAGNOSIS — I10 Essential (primary) hypertension: Secondary | ICD-10-CM | POA: Diagnosis not present

## 2022-12-02 DIAGNOSIS — R531 Weakness: Secondary | ICD-10-CM | POA: Diagnosis not present

## 2022-12-04 DIAGNOSIS — R278 Other lack of coordination: Secondary | ICD-10-CM | POA: Diagnosis not present

## 2022-12-04 DIAGNOSIS — G308 Other Alzheimer's disease: Secondary | ICD-10-CM | POA: Diagnosis not present

## 2022-12-04 DIAGNOSIS — M6281 Muscle weakness (generalized): Secondary | ICD-10-CM | POA: Diagnosis not present

## 2022-12-04 DIAGNOSIS — R269 Unspecified abnormalities of gait and mobility: Secondary | ICD-10-CM | POA: Diagnosis not present

## 2022-12-04 DIAGNOSIS — E1122 Type 2 diabetes mellitus with diabetic chronic kidney disease: Secondary | ICD-10-CM | POA: Diagnosis not present

## 2022-12-04 DIAGNOSIS — I10 Essential (primary) hypertension: Secondary | ICD-10-CM | POA: Diagnosis not present

## 2022-12-04 DIAGNOSIS — G309 Alzheimer's disease, unspecified: Secondary | ICD-10-CM | POA: Diagnosis not present

## 2022-12-04 DIAGNOSIS — N183 Chronic kidney disease, stage 3 unspecified: Secondary | ICD-10-CM | POA: Diagnosis not present

## 2022-12-04 DIAGNOSIS — R531 Weakness: Secondary | ICD-10-CM | POA: Diagnosis not present

## 2022-12-04 DIAGNOSIS — Z9181 History of falling: Secondary | ICD-10-CM | POA: Diagnosis not present

## 2022-12-08 DIAGNOSIS — M6281 Muscle weakness (generalized): Secondary | ICD-10-CM | POA: Diagnosis not present

## 2022-12-08 DIAGNOSIS — I10 Essential (primary) hypertension: Secondary | ICD-10-CM | POA: Diagnosis not present

## 2022-12-08 DIAGNOSIS — G309 Alzheimer's disease, unspecified: Secondary | ICD-10-CM | POA: Diagnosis not present

## 2022-12-08 DIAGNOSIS — R269 Unspecified abnormalities of gait and mobility: Secondary | ICD-10-CM | POA: Diagnosis not present

## 2022-12-08 DIAGNOSIS — N183 Chronic kidney disease, stage 3 unspecified: Secondary | ICD-10-CM | POA: Diagnosis not present

## 2022-12-08 DIAGNOSIS — Z9181 History of falling: Secondary | ICD-10-CM | POA: Diagnosis not present

## 2022-12-08 DIAGNOSIS — G308 Other Alzheimer's disease: Secondary | ICD-10-CM | POA: Diagnosis not present

## 2022-12-08 DIAGNOSIS — E1122 Type 2 diabetes mellitus with diabetic chronic kidney disease: Secondary | ICD-10-CM | POA: Diagnosis not present

## 2022-12-08 DIAGNOSIS — R278 Other lack of coordination: Secondary | ICD-10-CM | POA: Diagnosis not present

## 2022-12-08 DIAGNOSIS — R531 Weakness: Secondary | ICD-10-CM | POA: Diagnosis not present

## 2022-12-09 DIAGNOSIS — R531 Weakness: Secondary | ICD-10-CM | POA: Diagnosis not present

## 2022-12-09 DIAGNOSIS — E1122 Type 2 diabetes mellitus with diabetic chronic kidney disease: Secondary | ICD-10-CM | POA: Diagnosis not present

## 2022-12-09 DIAGNOSIS — I10 Essential (primary) hypertension: Secondary | ICD-10-CM | POA: Diagnosis not present

## 2022-12-09 DIAGNOSIS — Z9181 History of falling: Secondary | ICD-10-CM | POA: Diagnosis not present

## 2022-12-09 DIAGNOSIS — G309 Alzheimer's disease, unspecified: Secondary | ICD-10-CM | POA: Diagnosis not present

## 2022-12-09 DIAGNOSIS — N183 Chronic kidney disease, stage 3 unspecified: Secondary | ICD-10-CM | POA: Diagnosis not present

## 2022-12-11 DIAGNOSIS — R278 Other lack of coordination: Secondary | ICD-10-CM | POA: Diagnosis not present

## 2022-12-11 DIAGNOSIS — R531 Weakness: Secondary | ICD-10-CM | POA: Diagnosis not present

## 2022-12-11 DIAGNOSIS — M6281 Muscle weakness (generalized): Secondary | ICD-10-CM | POA: Diagnosis not present

## 2022-12-11 DIAGNOSIS — E1122 Type 2 diabetes mellitus with diabetic chronic kidney disease: Secondary | ICD-10-CM | POA: Diagnosis not present

## 2022-12-11 DIAGNOSIS — I10 Essential (primary) hypertension: Secondary | ICD-10-CM | POA: Diagnosis not present

## 2022-12-11 DIAGNOSIS — G308 Other Alzheimer's disease: Secondary | ICD-10-CM | POA: Diagnosis not present

## 2022-12-11 DIAGNOSIS — R269 Unspecified abnormalities of gait and mobility: Secondary | ICD-10-CM | POA: Diagnosis not present

## 2022-12-11 DIAGNOSIS — Z9181 History of falling: Secondary | ICD-10-CM | POA: Diagnosis not present

## 2022-12-11 DIAGNOSIS — G309 Alzheimer's disease, unspecified: Secondary | ICD-10-CM | POA: Diagnosis not present

## 2022-12-11 DIAGNOSIS — N183 Chronic kidney disease, stage 3 unspecified: Secondary | ICD-10-CM | POA: Diagnosis not present

## 2022-12-14 DIAGNOSIS — R531 Weakness: Secondary | ICD-10-CM | POA: Diagnosis not present

## 2022-12-14 DIAGNOSIS — G309 Alzheimer's disease, unspecified: Secondary | ICD-10-CM | POA: Diagnosis not present

## 2022-12-14 DIAGNOSIS — N183 Chronic kidney disease, stage 3 unspecified: Secondary | ICD-10-CM | POA: Diagnosis not present

## 2022-12-14 DIAGNOSIS — I10 Essential (primary) hypertension: Secondary | ICD-10-CM | POA: Diagnosis not present

## 2022-12-14 DIAGNOSIS — Z9181 History of falling: Secondary | ICD-10-CM | POA: Diagnosis not present

## 2022-12-14 DIAGNOSIS — E1122 Type 2 diabetes mellitus with diabetic chronic kidney disease: Secondary | ICD-10-CM | POA: Diagnosis not present

## 2022-12-15 DIAGNOSIS — D649 Anemia, unspecified: Secondary | ICD-10-CM | POA: Diagnosis not present

## 2022-12-15 DIAGNOSIS — G308 Other Alzheimer's disease: Secondary | ICD-10-CM | POA: Diagnosis not present

## 2022-12-15 DIAGNOSIS — Z9181 History of falling: Secondary | ICD-10-CM | POA: Diagnosis not present

## 2022-12-15 DIAGNOSIS — M6281 Muscle weakness (generalized): Secondary | ICD-10-CM | POA: Diagnosis not present

## 2022-12-15 DIAGNOSIS — R278 Other lack of coordination: Secondary | ICD-10-CM | POA: Diagnosis not present

## 2022-12-15 DIAGNOSIS — R269 Unspecified abnormalities of gait and mobility: Secondary | ICD-10-CM | POA: Diagnosis not present

## 2022-12-16 DIAGNOSIS — I10 Essential (primary) hypertension: Secondary | ICD-10-CM | POA: Diagnosis not present

## 2022-12-16 DIAGNOSIS — R531 Weakness: Secondary | ICD-10-CM | POA: Diagnosis not present

## 2022-12-16 DIAGNOSIS — G309 Alzheimer's disease, unspecified: Secondary | ICD-10-CM | POA: Diagnosis not present

## 2022-12-16 DIAGNOSIS — Z9181 History of falling: Secondary | ICD-10-CM | POA: Diagnosis not present

## 2022-12-16 DIAGNOSIS — N183 Chronic kidney disease, stage 3 unspecified: Secondary | ICD-10-CM | POA: Diagnosis not present

## 2022-12-16 DIAGNOSIS — E1122 Type 2 diabetes mellitus with diabetic chronic kidney disease: Secondary | ICD-10-CM | POA: Diagnosis not present

## 2022-12-17 DIAGNOSIS — I10 Essential (primary) hypertension: Secondary | ICD-10-CM | POA: Diagnosis not present

## 2022-12-17 DIAGNOSIS — E1122 Type 2 diabetes mellitus with diabetic chronic kidney disease: Secondary | ICD-10-CM | POA: Diagnosis not present

## 2022-12-17 DIAGNOSIS — G309 Alzheimer's disease, unspecified: Secondary | ICD-10-CM | POA: Diagnosis not present

## 2022-12-17 DIAGNOSIS — Z9181 History of falling: Secondary | ICD-10-CM | POA: Diagnosis not present

## 2022-12-17 DIAGNOSIS — R531 Weakness: Secondary | ICD-10-CM | POA: Diagnosis not present

## 2022-12-17 DIAGNOSIS — N183 Chronic kidney disease, stage 3 unspecified: Secondary | ICD-10-CM | POA: Diagnosis not present

## 2023-02-23 ENCOUNTER — Other Ambulatory Visit (HOSPITAL_COMMUNITY): Payer: Self-pay

## 2023-02-24 DEATH — deceased
